# Patient Record
Sex: Female | Born: 1975 | Race: Black or African American | Hispanic: No | Marital: Single | Smoking: Never smoker
Health system: Southern US, Community
[De-identification: ages and names within clinical notes are randomized; demographics above are authoritative.]

## PROBLEM LIST (undated history)

## (undated) DIAGNOSIS — F419 Anxiety disorder, unspecified: Secondary | ICD-10-CM

## (undated) DIAGNOSIS — T7840XA Allergy, unspecified, initial encounter: Secondary | ICD-10-CM

## (undated) DIAGNOSIS — F319 Bipolar disorder, unspecified: Secondary | ICD-10-CM

## (undated) DIAGNOSIS — H409 Unspecified glaucoma: Secondary | ICD-10-CM

## (undated) DIAGNOSIS — F32A Depression, unspecified: Secondary | ICD-10-CM

## (undated) DIAGNOSIS — F329 Major depressive disorder, single episode, unspecified: Secondary | ICD-10-CM

## (undated) DIAGNOSIS — R569 Unspecified convulsions: Secondary | ICD-10-CM

## (undated) HISTORY — PX: FACIAL FRACTURE SURGERY: SHX1570

## (undated) HISTORY — DX: Unspecified glaucoma: H40.9

## (undated) HISTORY — DX: Allergy, unspecified, initial encounter: T78.40XA

## (undated) HISTORY — DX: Anxiety disorder, unspecified: F41.9

## (undated) HISTORY — DX: Depression, unspecified: F32.A

## (undated) HISTORY — PX: TUBAL LIGATION: SHX77

## (undated) HISTORY — DX: Major depressive disorder, single episode, unspecified: F32.9

## (undated) HISTORY — PX: OTHER SURGICAL HISTORY: SHX169

---

## 2006-12-24 ENCOUNTER — Emergency Department: Payer: Self-pay | Admitting: Emergency Medicine

## 2007-10-16 ENCOUNTER — Emergency Department: Payer: Self-pay | Admitting: Emergency Medicine

## 2007-10-16 ENCOUNTER — Other Ambulatory Visit: Payer: Self-pay

## 2007-10-29 ENCOUNTER — Other Ambulatory Visit: Payer: Self-pay

## 2007-10-29 ENCOUNTER — Emergency Department: Payer: Self-pay | Admitting: Emergency Medicine

## 2007-11-16 ENCOUNTER — Emergency Department: Payer: Self-pay | Admitting: Internal Medicine

## 2008-02-01 ENCOUNTER — Emergency Department: Payer: Self-pay | Admitting: Internal Medicine

## 2008-02-01 ENCOUNTER — Other Ambulatory Visit: Payer: Self-pay

## 2008-09-22 ENCOUNTER — Emergency Department: Payer: Self-pay | Admitting: Emergency Medicine

## 2008-09-27 ENCOUNTER — Emergency Department: Payer: Self-pay | Admitting: Emergency Medicine

## 2008-11-25 ENCOUNTER — Emergency Department: Payer: Self-pay | Admitting: Emergency Medicine

## 2009-01-05 ENCOUNTER — Emergency Department: Payer: Self-pay | Admitting: Emergency Medicine

## 2009-02-01 ENCOUNTER — Emergency Department: Payer: Self-pay | Admitting: Emergency Medicine

## 2009-11-23 ENCOUNTER — Emergency Department: Payer: Self-pay | Admitting: Emergency Medicine

## 2010-01-13 ENCOUNTER — Emergency Department: Payer: Self-pay | Admitting: Internal Medicine

## 2010-03-17 ENCOUNTER — Inpatient Hospital Stay: Payer: Self-pay | Admitting: Psychiatry

## 2010-05-25 ENCOUNTER — Inpatient Hospital Stay: Payer: Self-pay | Admitting: Psychiatry

## 2011-02-21 ENCOUNTER — Emergency Department: Payer: Self-pay | Admitting: Emergency Medicine

## 2011-06-18 ENCOUNTER — Inpatient Hospital Stay: Payer: Self-pay | Admitting: Psychiatry

## 2011-07-11 ENCOUNTER — Inpatient Hospital Stay: Payer: Self-pay | Admitting: Psychiatry

## 2011-08-15 ENCOUNTER — Emergency Department: Payer: Self-pay | Admitting: Emergency Medicine

## 2011-08-27 ENCOUNTER — Inpatient Hospital Stay: Payer: Self-pay | Admitting: Psychiatry

## 2011-10-01 ENCOUNTER — Emergency Department: Payer: Self-pay | Admitting: Emergency Medicine

## 2011-10-11 ENCOUNTER — Inpatient Hospital Stay: Payer: Self-pay | Admitting: Psychiatry

## 2011-11-28 ENCOUNTER — Emergency Department: Payer: Self-pay | Admitting: Emergency Medicine

## 2012-01-18 ENCOUNTER — Emergency Department: Payer: Self-pay | Admitting: Unknown Physician Specialty

## 2012-01-18 LAB — URINALYSIS, COMPLETE
Glucose,UR: NEGATIVE mg/dL (ref 0–75)
Leukocyte Esterase: NEGATIVE
Nitrite: NEGATIVE
Protein: NEGATIVE
RBC,UR: 8 /HPF (ref 0–5)
Specific Gravity: 1.002 (ref 1.003–1.030)
WBC UR: <1 /HPF (ref 0–5)

## 2012-01-18 LAB — COMPREHENSIVE METABOLIC PANEL
Alkaline Phosphatase: 72 U/L (ref 50–136)
Anion Gap: 13 (ref 7–16)
BUN: 6 mg/dL — ABNORMAL LOW (ref 7–18)
Bilirubin,Total: 0.3 mg/dL (ref 0.2–1.0)
Calcium, Total: 8.1 mg/dL — ABNORMAL LOW (ref 8.5–10.1)
Creatinine: 0.64 mg/dL (ref 0.60–1.30)
EGFR (African American): >60
EGFR (Non-African Amer.): >60
Osmolality: 286 (ref 275–301)
Potassium: 3.5 mmol/L (ref 3.5–5.1)
SGPT (ALT): 30 U/L
Sodium: 145 mmol/L (ref 136–145)
Total Protein: 7.9 g/dL (ref 6.4–8.2)

## 2012-01-18 LAB — CBC
HCT: 31.9 % — ABNORMAL LOW (ref 35.0–47.0)
HGB: 10.5 g/dL — ABNORMAL LOW (ref 12.0–16.0)
MCH: 24.3 pg — ABNORMAL LOW (ref 26.0–34.0)
MCHC: 32.9 g/dL (ref 32.0–36.0)
MCV: 74 fL — ABNORMAL LOW (ref 80–100)
Platelet: 431 10*3/uL (ref 150–440)
RDW: 18.8 % — ABNORMAL HIGH (ref 11.5–14.5)

## 2012-01-18 LAB — DRUG SCREEN, URINE
Barbiturates, Ur Screen: NEGATIVE (ref ?–200)
Cannabinoid 50 Ng, Ur ~~LOC~~: NEGATIVE (ref ?–50)
Cocaine Metabolite,Ur ~~LOC~~: POSITIVE (ref ?–300)
MDMA (Ecstasy)Ur Screen: NEGATIVE (ref ?–500)
Methadone, Ur Screen: NEGATIVE (ref ?–300)
Opiate, Ur Screen: NEGATIVE (ref ?–300)

## 2012-01-18 LAB — ETHANOL
Ethanol %: 0.366 % (ref 0.000–0.080)
Ethanol: 366 mg/dL

## 2012-01-18 LAB — ACETAMINOPHEN LEVEL: Acetaminophen: <2 ug/mL

## 2012-01-19 LAB — ETHANOL
Ethanol %: 0.092 % — ABNORMAL HIGH
Ethanol: 92 mg/dL

## 2012-01-22 ENCOUNTER — Emergency Department: Payer: Self-pay | Admitting: Emergency Medicine

## 2012-01-22 LAB — DRUG SCREEN, URINE
Amphetamines, Ur Screen: NEGATIVE (ref ?–1000)
Cocaine Metabolite,Ur ~~LOC~~: POSITIVE (ref ?–300)
MDMA (Ecstasy)Ur Screen: NEGATIVE (ref ?–500)
Opiate, Ur Screen: NEGATIVE (ref ?–300)
Phencyclidine (PCP) Ur S: NEGATIVE (ref ?–25)
Tricyclic, Ur Screen: NEGATIVE (ref ?–1000)

## 2012-01-22 LAB — URINALYSIS, COMPLETE
Bacteria: NONE SEEN
Blood: NEGATIVE
Ketone: NEGATIVE
Nitrite: NEGATIVE
Protein: NEGATIVE
Specific Gravity: 1.001 (ref 1.003–1.030)
WBC UR: <1 /HPF (ref 0–5)

## 2012-01-23 LAB — CBC
HGB: 10.6 g/dL — ABNORMAL LOW (ref 12.0–16.0)
MCH: 24.1 pg — ABNORMAL LOW (ref 26.0–34.0)
MCHC: 32.4 g/dL (ref 32.0–36.0)
Platelet: 408 10*3/uL (ref 150–440)
RDW: 19.3 % — ABNORMAL HIGH (ref 11.5–14.5)
WBC: 5.4 10*3/uL (ref 3.6–11.0)

## 2012-01-23 LAB — COMPREHENSIVE METABOLIC PANEL
Alkaline Phosphatase: 63 U/L (ref 50–136)
Anion Gap: 10 (ref 7–16)
BUN: 5 mg/dL — ABNORMAL LOW (ref 7–18)
Chloride: 105 mmol/L (ref 98–107)
Co2: 25 mmol/L (ref 21–32)
Creatinine: 0.65 mg/dL (ref 0.60–1.30)
EGFR (African American): >60
Glucose: 103 mg/dL — ABNORMAL HIGH (ref 65–99)
Potassium: 3.5 mmol/L (ref 3.5–5.1)
SGOT(AST): 123 U/L — ABNORMAL HIGH (ref 15–37)
SGPT (ALT): 63 U/L
Total Protein: 8.3 g/dL — ABNORMAL HIGH (ref 6.4–8.2)

## 2012-01-23 LAB — ACETAMINOPHEN LEVEL: Acetaminophen: <2 ug/mL

## 2012-01-23 LAB — TSH: Thyroid Stimulating Horm: 1.05 u[IU]/mL

## 2012-01-23 LAB — ETHANOL
Ethanol %: 0.216 % — ABNORMAL HIGH (ref 0.000–0.080)
Ethanol: 216 mg/dL

## 2012-04-15 ENCOUNTER — Emergency Department: Payer: Self-pay | Admitting: Emergency Medicine

## 2012-04-15 LAB — DRUG SCREEN, URINE
Barbiturates, Ur Screen: NEGATIVE (ref ?–200)
Benzodiazepine, Ur Scrn: NEGATIVE (ref ?–200)
Cannabinoid 50 Ng, Ur ~~LOC~~: NEGATIVE (ref ?–50)
Cocaine Metabolite,Ur ~~LOC~~: POSITIVE (ref ?–300)
MDMA (Ecstasy)Ur Screen: NEGATIVE (ref ?–500)
Opiate, Ur Screen: NEGATIVE (ref ?–300)
Phencyclidine (PCP) Ur S: NEGATIVE (ref ?–25)

## 2012-04-15 LAB — CBC
HCT: 31.7 % — ABNORMAL LOW (ref 35.0–47.0)
HGB: 10.5 g/dL — ABNORMAL LOW (ref 12.0–16.0)
MCHC: 33.2 g/dL (ref 32.0–36.0)
MCV: 67 fL — ABNORMAL LOW (ref 80–100)
Platelet: 483 10*3/uL — ABNORMAL HIGH (ref 150–440)
RBC: 4.71 10*6/uL (ref 3.80–5.20)
RDW: 19.3 % — ABNORMAL HIGH (ref 11.5–14.5)

## 2012-04-15 LAB — URINALYSIS, COMPLETE
Bilirubin,UR: NEGATIVE
Blood: NEGATIVE
Ketone: NEGATIVE
Nitrite: NEGATIVE
Ph: 5 (ref 4.5–8.0)
Protein: NEGATIVE
Specific Gravity: 1.003 (ref 1.003–1.030)
Squamous Epithelial: 3
WBC UR: 1 /HPF (ref 0–5)

## 2012-04-15 LAB — COMPREHENSIVE METABOLIC PANEL
Albumin: 3.7 g/dL (ref 3.4–5.0)
Anion Gap: 10 (ref 7–16)
BUN: 4 mg/dL — ABNORMAL LOW (ref 7–18)
Bilirubin,Total: 0.3 mg/dL (ref 0.2–1.0)
Calcium, Total: 8.5 mg/dL (ref 8.5–10.1)
Co2: 23 mmol/L (ref 21–32)
Creatinine: 0.87 mg/dL (ref 0.60–1.30)
EGFR (African American): >60
Potassium: 4.1 mmol/L (ref 3.5–5.1)
SGOT(AST): 51 U/L — ABNORMAL HIGH (ref 15–37)
SGPT (ALT): 32 U/L
Sodium: 142 mmol/L (ref 136–145)
Total Protein: 8.4 g/dL — ABNORMAL HIGH (ref 6.4–8.2)

## 2012-04-15 LAB — ETHANOL
Ethanol %: 0.316 % (ref 0.000–0.080)
Ethanol: 316 mg/dL

## 2012-04-15 LAB — ACETAMINOPHEN LEVEL: Acetaminophen: <2 ug/mL

## 2012-04-15 LAB — PREGNANCY, URINE: Pregnancy Test, Urine: NEGATIVE m[IU]/mL

## 2012-06-15 ENCOUNTER — Emergency Department: Payer: Self-pay

## 2012-06-15 LAB — URINALYSIS, COMPLETE
Bacteria: NONE SEEN
Bilirubin,UR: NEGATIVE
Ketone: NEGATIVE
RBC,UR: NONE SEEN /HPF (ref 0–5)
Specific Gravity: 1.004 (ref 1.003–1.030)
Squamous Epithelial: 1
WBC UR: 1 /HPF (ref 0–5)

## 2012-06-15 LAB — DRUG SCREEN, URINE
Amphetamines, Ur Screen: NEGATIVE (ref ?–1000)
Cannabinoid 50 Ng, Ur ~~LOC~~: NEGATIVE (ref ?–50)
MDMA (Ecstasy)Ur Screen: NEGATIVE (ref ?–500)
Methadone, Ur Screen: NEGATIVE (ref ?–300)
Opiate, Ur Screen: NEGATIVE (ref ?–300)
Phencyclidine (PCP) Ur S: NEGATIVE (ref ?–25)

## 2012-06-15 LAB — COMPREHENSIVE METABOLIC PANEL
Albumin: 3.9 g/dL (ref 3.4–5.0)
Alkaline Phosphatase: 75 U/L (ref 50–136)
Anion Gap: 12 (ref 7–16)
Calcium, Total: 8.8 mg/dL (ref 8.5–10.1)
Chloride: 108 mmol/L — ABNORMAL HIGH (ref 98–107)
Glucose: 83 mg/dL (ref 65–99)
Osmolality: 275 (ref 275–301)
Potassium: 4.3 mmol/L (ref 3.5–5.1)
SGOT(AST): 73 U/L — ABNORMAL HIGH (ref 15–37)
Sodium: 140 mmol/L (ref 136–145)
Total Protein: 8.7 g/dL — ABNORMAL HIGH (ref 6.4–8.2)

## 2012-06-15 LAB — ETHANOL
Ethanol %: 0.267 % — ABNORMAL HIGH (ref 0.000–0.080)
Ethanol: 267 mg/dL

## 2012-06-15 LAB — CBC
HCT: 32.7 % — ABNORMAL LOW (ref 35.0–47.0)
HGB: 10 g/dL — ABNORMAL LOW (ref 12.0–16.0)
MCHC: 30.7 g/dL — ABNORMAL LOW (ref 32.0–36.0)
Platelet: 521 10*3/uL — ABNORMAL HIGH (ref 150–440)
RDW: 20 % — ABNORMAL HIGH (ref 11.5–14.5)

## 2012-06-15 LAB — TSH: Thyroid Stimulating Horm: 1.84 u[IU]/mL

## 2013-04-06 ENCOUNTER — Inpatient Hospital Stay: Payer: Self-pay | Admitting: Psychiatry

## 2013-04-06 LAB — COMPREHENSIVE METABOLIC PANEL
Alkaline Phosphatase: 72 U/L (ref 50–136)
Anion Gap: 12 (ref 7–16)
BUN: 6 mg/dL — ABNORMAL LOW (ref 7–18)
Bilirubin,Total: 0.3 mg/dL (ref 0.2–1.0)
Calcium, Total: 8.3 mg/dL — ABNORMAL LOW (ref 8.5–10.1)
Chloride: 109 mmol/L — ABNORMAL HIGH (ref 98–107)
Co2: 18 mmol/L — ABNORMAL LOW (ref 21–32)
Creatinine: 0.74 mg/dL (ref 0.60–1.30)
EGFR (African American): >60
Osmolality: 273 (ref 275–301)
Potassium: 3.6 mmol/L (ref 3.5–5.1)
SGOT(AST): 54 U/L — ABNORMAL HIGH (ref 15–37)
Sodium: 139 mmol/L (ref 136–145)
Total Protein: 8.3 g/dL — ABNORMAL HIGH (ref 6.4–8.2)

## 2013-04-06 LAB — URINALYSIS, COMPLETE
Blood: NEGATIVE
Glucose,UR: NEGATIVE mg/dL (ref 0–75)
Leukocyte Esterase: NEGATIVE
Protein: NEGATIVE
RBC,UR: <1 /HPF (ref 0–5)
WBC UR: <1 /HPF (ref 0–5)

## 2013-04-06 LAB — CBC
RBC: 4.59 10*6/uL (ref 3.80–5.20)
RDW: 18.6 % — ABNORMAL HIGH (ref 11.5–14.5)
WBC: 5.7 10*3/uL (ref 3.6–11.0)

## 2013-04-06 LAB — DRUG SCREEN, URINE
Amphetamines, Ur Screen: NEGATIVE (ref ?–1000)
Barbiturates, Ur Screen: NEGATIVE (ref ?–200)
Cannabinoid 50 Ng, Ur ~~LOC~~: NEGATIVE (ref ?–50)
Cocaine Metabolite,Ur ~~LOC~~: POSITIVE (ref ?–300)
Opiate, Ur Screen: NEGATIVE (ref ?–300)
Phencyclidine (PCP) Ur S: NEGATIVE (ref ?–25)
Tricyclic, Ur Screen: NEGATIVE (ref ?–1000)

## 2013-04-06 LAB — SALICYLATE LEVEL: Salicylates, Serum: <1.7 mg/dL

## 2013-04-06 LAB — TSH: Thyroid Stimulating Horm: 2.97 u[IU]/mL

## 2013-04-07 LAB — BEHAVIORAL MEDICINE 1 PANEL
Anion Gap: 8 (ref 7–16)
Basophil %: 0.8 %
Bilirubin,Total: 0.9 mg/dL (ref 0.2–1.0)
Calcium, Total: 8.7 mg/dL (ref 8.5–10.1)
Chloride: 106 mmol/L (ref 98–107)
Creatinine: 0.59 mg/dL — ABNORMAL LOW (ref 0.60–1.30)
EGFR (African American): >60
EGFR (Non-African Amer.): >60
Glucose: 71 mg/dL (ref 65–99)
HCT: 32.7 % — ABNORMAL LOW (ref 35.0–47.0)
HGB: 11.1 g/dL — ABNORMAL LOW (ref 12.0–16.0)
Lymphocyte #: 1.6 10*3/uL (ref 1.0–3.6)
Lymphocyte %: 28.3 %
MCHC: 33.9 g/dL (ref 32.0–36.0)
Monocyte #: 0.7 x10 3/mm (ref 0.2–0.9)
Neutrophil %: 56.5 %
RBC: 4.77 10*6/uL (ref 3.80–5.20)
SGOT(AST): 77 U/L — ABNORMAL HIGH (ref 15–37)
SGPT (ALT): 34 U/L (ref 12–78)
WBC: 5.7 10*3/uL (ref 3.6–11.0)

## 2013-05-15 ENCOUNTER — Emergency Department: Payer: Self-pay | Admitting: Emergency Medicine

## 2013-05-15 LAB — URINALYSIS, COMPLETE
Glucose,UR: NEGATIVE mg/dL (ref 0–75)
Ketone: NEGATIVE
Nitrite: NEGATIVE
Ph: 6 (ref 4.5–8.0)
Protein: NEGATIVE
Specific Gravity: 1.005 (ref 1.003–1.030)

## 2013-05-15 LAB — DRUG SCREEN, URINE
Barbiturates, Ur Screen: NEGATIVE (ref ?–200)
Benzodiazepine, Ur Scrn: NEGATIVE (ref ?–200)
Cannabinoid 50 Ng, Ur ~~LOC~~: NEGATIVE (ref ?–50)
Opiate, Ur Screen: NEGATIVE (ref ?–300)
Phencyclidine (PCP) Ur S: NEGATIVE (ref ?–25)

## 2013-05-16 LAB — CBC WITH DIFFERENTIAL/PLATELET
Basophil #: 0.1 10*3/uL (ref 0.0–0.1)
Basophil %: 1 %
Eosinophil #: 0 10*3/uL (ref 0.0–0.7)
HCT: 30 % — ABNORMAL LOW (ref 35.0–47.0)
HGB: 10 g/dL — ABNORMAL LOW (ref 12.0–16.0)
Lymphocyte #: 1.6 10*3/uL (ref 1.0–3.6)
MCH: 23 pg — ABNORMAL LOW (ref 26.0–34.0)
MCHC: 33.4 g/dL (ref 32.0–36.0)
Monocyte #: 0.4 x10 3/mm (ref 0.2–0.9)
Monocyte %: 8.8 %
Neutrophil #: 2.8 10*3/uL (ref 1.4–6.5)
Neutrophil %: 57.1 %
Platelet: 271 10*3/uL (ref 150–440)
RBC: 4.36 10*6/uL (ref 3.80–5.20)
RDW: 20.2 % — ABNORMAL HIGH (ref 11.5–14.5)
WBC: 5 10*3/uL (ref 3.6–11.0)

## 2013-05-16 LAB — COMPREHENSIVE METABOLIC PANEL
Albumin: 3.3 g/dL — ABNORMAL LOW (ref 3.4–5.0)
BUN: 7 mg/dL (ref 7–18)
Calcium, Total: 8.4 mg/dL — ABNORMAL LOW (ref 8.5–10.1)
Chloride: 106 mmol/L (ref 98–107)
Co2: 21 mmol/L (ref 21–32)
Creatinine: 0.67 mg/dL (ref 0.60–1.30)
EGFR (African American): >60
EGFR (Non-African Amer.): >60
Osmolality: 273 (ref 275–301)
Potassium: 3.5 mmol/L (ref 3.5–5.1)
Sodium: 138 mmol/L (ref 136–145)
Total Protein: 7.8 g/dL (ref 6.4–8.2)

## 2013-05-16 LAB — ETHANOL
Ethanol %: 0.287 % — ABNORMAL HIGH (ref 0.000–0.080)
Ethanol: 287 mg/dL

## 2014-03-15 ENCOUNTER — Emergency Department: Payer: Self-pay | Admitting: Internal Medicine

## 2014-12-28 ENCOUNTER — Inpatient Hospital Stay: Payer: Self-pay | Admitting: Psychiatry

## 2014-12-28 LAB — CBC
HCT: 31.1 % — ABNORMAL LOW (ref 35.0–47.0)
HGB: 9.9 g/dL — AB (ref 12.0–16.0)
MCH: 21.9 pg — AB (ref 26.0–34.0)
MCHC: 31.9 g/dL — ABNORMAL LOW (ref 32.0–36.0)
MCV: 69 fL — AB (ref 80–100)
PLATELETS: 476 10*3/uL — AB (ref 150–440)
RBC: 4.54 10*6/uL (ref 3.80–5.20)
RDW: 20.9 % — ABNORMAL HIGH (ref 11.5–14.5)
WBC: 5.3 10*3/uL (ref 3.6–11.0)

## 2014-12-28 LAB — DRUG SCREEN, URINE
AMPHETAMINES, UR SCREEN: NEGATIVE (ref ?–1000)
BARBITURATES, UR SCREEN: NEGATIVE (ref ?–200)
Benzodiazepine, Ur Scrn: NEGATIVE (ref ?–200)
Cannabinoid 50 Ng, Ur ~~LOC~~: NEGATIVE (ref ?–50)
Cocaine Metabolite,Ur ~~LOC~~: POSITIVE (ref ?–300)
MDMA (ECSTASY) UR SCREEN: NEGATIVE (ref ?–500)
Methadone, Ur Screen: NEGATIVE (ref ?–300)
OPIATE, UR SCREEN: NEGATIVE (ref ?–300)
Phencyclidine (PCP) Ur S: NEGATIVE (ref ?–25)
TRICYCLIC, UR SCREEN: NEGATIVE (ref ?–1000)

## 2014-12-28 LAB — URINALYSIS, COMPLETE
BACTERIA: NONE SEEN
BILIRUBIN, UR: NEGATIVE
Glucose,UR: NEGATIVE mg/dL (ref 0–75)
Ketone: NEGATIVE
LEUKOCYTE ESTERASE: NEGATIVE
Nitrite: NEGATIVE
PH: 5 (ref 4.5–8.0)
Protein: NEGATIVE
RBC,UR: <1 /HPF (ref 0–5)
Specific Gravity: 1.002 (ref 1.003–1.030)
Squamous Epithelial: 1
WBC UR: NONE SEEN /HPF (ref 0–5)

## 2014-12-28 LAB — COMPREHENSIVE METABOLIC PANEL
ALT: 49 U/L
Albumin: 3.6 g/dL (ref 3.4–5.0)
Alkaline Phosphatase: 85 U/L
Anion Gap: 6 — ABNORMAL LOW (ref 7–16)
BILIRUBIN TOTAL: 0.2 mg/dL (ref 0.2–1.0)
BUN: 6 mg/dL — AB (ref 7–18)
CHLORIDE: 105 mmol/L (ref 98–107)
CREATININE: 0.84 mg/dL (ref 0.60–1.30)
Calcium, Total: 8.5 mg/dL (ref 8.5–10.1)
Co2: 29 mmol/L (ref 21–32)
EGFR (African American): >60
EGFR (Non-African Amer.): >60
Glucose: 86 mg/dL (ref 65–99)
Osmolality: 276 (ref 275–301)
Potassium: 4 mmol/L (ref 3.5–5.1)
SGOT(AST): 75 U/L — ABNORMAL HIGH (ref 15–37)
Sodium: 140 mmol/L (ref 136–145)
TOTAL PROTEIN: 8.5 g/dL — AB (ref 6.4–8.2)

## 2014-12-28 LAB — ETHANOL
Ethanol: 360 mg/dL
Ethanol: <3 mg/dL

## 2015-02-15 ENCOUNTER — Inpatient Hospital Stay: Payer: Self-pay | Admitting: Psychiatry

## 2015-04-06 NOTE — H&P (Signed)
PATIENT NAME:  Kara Lawrence,  S MR#:  [id number] DATE OF BIRTH:  [birth year]  DATE OF ADMISSION:  04/06/2013  DATE OF CONSULTATION: 04/06/2013.   IDENTIFYING INFORMATION AND CHIEF COMPLAINT: A 39 year old woman brought to the Emergency Room after calling the police on herself. The patient tells me today: "I just showed a little emotion."   HISTORY OF PRESENT ILLNESS: The patient obtained from the patient and the chart. The patient apparently called law on herself and then became agitated and aggressive with police when they showed up. This is a pretty typical behavior for her and she has done that before. She was intoxicated obviously. The patient was only willing to speak briefly with me, but did tell me that she has been drinking quite a bit recently, although she cannot say how much. She denied to me that she has been using any other drugs, but she is clearly been using cocaine. She has not been taking any of her prescribed medicines or following up with outpatient psychiatric care. She had been staying at her grandmother's house recently. She says that she has been under a lot of stress and had a lot on her mind, but she will not share any specifics about that. She does not report any new medical problems.   PAST PSYCHIATRIC HISTORY: The patient has an established history of alcohol dependence,  cocaine dependence and a diagnosis of bipolar disorder in the past with mood lability and impulsive behavior, both when she is intoxicated and when she is not. In the past, she has been treated with mood stabilizers, which seemed to have been of some benefit, although the main thing that helps her would be if she could stay sober. She has been referred to outpatient treatment in the past and apparently has followed up for at least periods of time, but recently has not been going. She does have a past history of suicide attempts, mainly by overdose. She has a history of being aggressive, throwing punches, acting  aggressive especially with law enforcement.   SOCIAL HISTORY: The patient has four children, none of whom are in her custody. She has been living with her grandmother or her boyfriend recently. She is not working. She will not share much other recent detail.   PAST MEDICAL HISTORY: No significant ongoing medical problems.   FAMILY HISTORY: No identified family history.    CURRENT MEDICATIONS: None.   ALLERGIES: No known drug allergies.   SUBSTANCE ABUSE HISTORY: History of alcohol dependence. No history of seizures or delirium tremens. History of cocaine dependence.   REVIEW OF SYSTEMS: Complains of being tired. Mood is bad. Denies acute suicidal thoughts. Denies any hallucinations or delusions. Denies being in physical pain or feeling sick to her stomach.   MENTAL STATUS EXAMINATION: The patient was awake and at least partially cooperative. Eye contact poor. Psychomotor activity sluggish. Speech quiet and minimal in amount. Affect blunted. Mood stated as being a bad. Thoughts slow. No gross disorganization. No evidence of delusions. Denied auditory or visual hallucinations. Denied acute suicidal or homicidal plan. Admits that she had had some suicidal thoughts earlier. The patient's alert and oriented x 4. Baseline intelligence average. Judgment and insight poor.   PHYSICAL EXAMINATION: GENERAL: The patient does not appear to be in any acute physical distress. No skin lesions identified.  HEENT: Pupils equal and reactive. Face symmetric. Oral mucosa dry.  NECK AND BACK: Nontender.  MUSCULOSKELETAL: Full range of motion at all extremities. Normal gait. Strength and reflexes normal and symmetric  throughout. Cranial nerves symmetric and normal.  LUNGS: Clear to auscultation. No wheezes.  HEART: Regular rate and rhythm.  ABDOMEN: Soft, nontender, normal bowel sounds.  CURRENT VITAL SIGNS: Show a temperature of 97.6, pulse 102, respirations 18, blood pressure 116/70.   LABORATORY RESULTS:  Drug screen positive for cocaine. Chemistries: Multiple abnormalities, chloride elevated at 109, BUN low at 6, CO2 low at 18, calcium low at 8.3, AST elevated at 54, total protein elevated 8.3. Alcohol level was 310 done at 5:50 this morning. Thyroid stimulating hormone normal at 2.97. CBC shows anemia with hemoglobin of 11.3.   ASSESSMENT: A 39 year old woman with alcohol and cocaine dependence, mood disorder, history of chaotic and dangerous behavior, presents in another fit of dangerousness requiring detox treatment and mood stabilization.   TREATMENT PLAN: Admit to psychiatry. Detox protocol in place. Tomorrow, I will discuss medication with her and propose that we restart Depakote, which has been helpful in the past. Engage her in groups and activities. Try and find out more about her social situation. Work on appropriate outpatient referral.   DIAGNOSIS, PRINCIPAL AND PRIMARY:  AXIS I: Alcohol dependence.   SECONDARY DIAGNOSES: AXIS I:  1.  Cocaine dependence.  2.  Bipolar disorder, not otherwise specified.   AXIS II: Borderline and antisocial personality disorder.   AXIS III: Alcohol intoxication, anemia, iron deficiency, probably from poor diet.   AXIS IV: Severe chronic stress from her illness.   AXIS V: Functioning at time of evaluation 30.    ____________________________ Audery Amel, MD jtc:cc D: 04/06/2013 18:31:58 ET T: 04/06/2013 19:06:33 ET JOB#: [id number]  cc: Audery Amel, MD, <Dictator> Audery Amel MD ELECTRONICALLY SIGNED 04/07/2013 15:09

## 2015-04-06 NOTE — Discharge Summary (Signed)
PATIENT NAME:  Kara Lawrence, Kara Lawrence MR#:  [id number] DATE OF BIRTH:  08-11-1976  DATE OF ADMISSION:  04/06/2013 DATE OF DISCHARGE:  04/07/2013  HOSPITAL COURSE: See dictated history and physical for details of admission. This 39 year old woman with a long history of polysubstance dependence and mood instability came into the hospital intoxicated, agitated, having again called 911 on herself, and then acted out in an agitated manner towards law enforcement when they did arrive. The patient was reticent about giving much history. She did admit that she had had some kind of conflict with her boyfriend, and did not think that she had a place to live right now. She had been back to drinking again. She declined to elaborate for how long she had been drinking heavily, or exactly how much. She also admitted that she has been smoking cocaine and using marijuana. Mood has been more out of control. She has not been going to any kind of outpatient treatment or staying on medication.   The patient was displaying agitated, disorganized behavior when she came to the Emergency Room in an intoxicated manner. Has had statements of suicidal ideation. She was initially uncooperative with evaluation, but after several hours of resting she was willing to at least communicate a small bit, although she remains very tired and withdrawn.   This patient is known to our facility and has had multiple presentations under similar circumstances. She has a longstanding problem with substance dependence, which has led to frequent episodes of dangerous and potentially life-threatening behavior, especially when she starts to act out in an aggressive way with law enforcement. The patient has been transferred to [facility] and been in other substance abuse programs before, with only partial sustained benefit.   At this point the patient's primary need appears to be detox and reinstitution of substance abuse treatment. Medications have been used for  her mood in the past, but it is not clear that they have of significantly greater benefit than staying sober. We have obtained authorization of a bed at [facility]. In my estimation this will be an improved treatment environment because of the addiction specialists there and their specialized program for therapeutic treatment of substance abuse problems, which we have only to a small degree at our facility. The patient will be transferred there. She is currently physically stable, but will probably require further monitoring for detox. She has been restarted on Depakote and Celexa as she was on in the past. At the time of discharge she is not violent or aggressive, but is somewhat passively uncooperative.   MENTAL STATUS EXAMINATION: Disheveled woman, looks her stated age. Minimal to no eye contact. Minimal to no psychomotor activity. Speech minimal in amount. Affect flat,  slightly irritated. Mood stated as being "whatever." Thoughts are flat. Not grossly disorganized. Minimal expression. No obvious psychosis. Denies any acute suicidal or homicidal ideation. Judgment and insight chronically impaired. Baseline intelligence probably average.   LABORATORY RESULTS: Admission labs yesterday showed a TSH normal at 2.9, an initial alcohol level of 310 yesterday at 6:00 in the morning.   Chemistries with just a slightly low BUN at 6, elevated chloride 109, decreased CO2 at 18, calcium low at 8.3, AST elevated at 54, total protein elevated 8.3.   Drug screen positive for cocaine.   CBC shows an anemia with a hematocrit of 31.7, hemoglobin 10.3, low MCV at 69. White count normal.   Pregnancy test negative.   Acetaminophen and salicylates undetected.  DISCHARGE MEDICATIONS: Thiamine 100 mg p.o. daily, trazodone 100 mg p.o. at bedtime p.r.n. for sleep, Celexa 40 mg per day, Depakote 500 mg twice a day, and detox medication at the discretion of the receiving facility.    DIAGNOSES, PRINCIPAL AND PRIMARY:   AXIS I: Alcohol dependence.   SECONDARY DIAGNOSES: AXIS I:  1.  Cocaine dependence.  2.  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   AXIS II: Antisocial and borderline features.   AXIS III: Alcohol withdrawal, and chronic anemia.   AXIS IV: Severe, from homelessness, joblessness, chaotic living.   AXIS V: Functioning at time of discharge: 40.     ____________________________ Audery AmelJohn T. Carleta Woodrow, MD jtc:dm D: 04/07/2013 10:46:21 ET T: 04/07/2013 10:54:45 ET JOB#: [id number]  cc: Audery AmelJohn T. Demani Weyrauch, MD, <Dictator> Audery AmelJOHN T Shiya Fogelman MD ELECTRONICALLY SIGNED 04/07/2013 15:09

## 2015-04-06 NOTE — Consult Note (Signed)
Brief Consult Note: Diagnosis: alcohol dependence.   Patient was seen by consultant.   Recommend further assessment or treatment.   Orders entered.   Discussed with Attending MD.   Comments: Psychiatry: Patient seen. Chart reviewed. Patient with longstanding SA and mood problem. ADmit for detox and mood stabilization.  Electronic Signatures: Kataleyah Carducci, Jackquline DenmarkJohn T (MD)  (Signed 23-Apr-14 18:23)  Authored: Brief Consult Note   Last Updated: 23-Apr-14 18:23 by Audery Amellapacs, Tegh Franek T (MD)

## 2015-04-08 NOTE — Consult Note (Signed)
Brief Consult Note: Diagnosis: polysubstance dependence.   Patient was seen by consultant.   Consult note dictated.   Discussed with Attending MD.   Comments: Psychiatry: Patient seen. Chart reviewed. 39 year old woman with history of substance abuse presented to er yesterday acutely intoxicated. Was voicing some suicidal ideation. Now she is sober and denies any suicidal ideation. Mood is reported as being good. Thoughts are lucid. Affect euthymic and at baseline. Patient has outpt treatment in place and is agreeable to following up. Has a place to live. Spoke with ER doctor. Given past history and current presenttion pt not acutely dangerous and not likely to benifit from hospital treatment.  Terminating the IVC.  Electronic Signatures: Audery Amellapacs, John T (MD)  (Signed 08-Feb-13 15:23)  Authored: Brief Consult Note   Last Updated: 08-Feb-13 15:23 by Audery Amellapacs, John T (MD)

## 2015-04-08 NOTE — Consult Note (Signed)
PATIENT NAME:  Kara Lawrence, Kara Lawrence MR#:  [id number] DATE OF BIRTH:  Aug 08, 1976  DATE OF CONSULTATION:  04/16/2012  REFERRING PHYSICIAN:   CONSULTING PHYSICIAN:  Audery Amel, MD  IDENTIFYING INFORMATION AND REASON FOR CONSULT: This is a 39 year old woman who came into the emergency room last night highly intoxicated, agitated. Consult for evaluation of dangerousness and disposition.   CHIEF COMPLAINT: Her chief complaint to me today is "can I go home now".   HISTORY OF PRESENT ILLNESS: The patient came into the emergency room yesterday evening and was extremely intoxicated. She tells me today that she cannot remember how much she was drinking. When she came in last night she was almost incoherent was screaming, yelling, and could not even stand up to go to the bathroom. The evaluation from last night says that she had made some comments about taking some kind of pills and an overdose and needing to be in the hospital. Labs were done, and there is no sign of any overdose other than use of cocaine. She tells me today that she drank about 14 or more beers yesterday, cannot remember the total amount. Also admits that she uses cocaine intermittently. She has been noncompliant with her psychiatric medicine and her follow-up psychiatric appointments. She says that her mood for the most part has been good. She does not feel like she has a lot of stress in her life. Totally denies any suicidal ideation or homicidal ideation. Denies any psychotic symptoms. She denies any tremors or withdrawal symptoms currently.   PAST PSYCHIATRIC HISTORY: She has been seen outpatient for mood stability and has been prescribed Depakote in the past. She says that at times she is compliant with it, but recently she has not been picking it up and not been following up with outpatient therapy either. She has a long history of alcohol dependence and has presented multiple times to the emergency room in a similar condition of  intoxication. As usual when she sobers up, she returns to a calm baseline and totally denies any suicidal ideation. Unfortunately she also has no motivation or interest in stopping drinking.   SUBSTANCE ABUSE HISTORY: Many years of heavy drinking like this. Drinks on a daily basis and then occasionally binge drinks as well. Uses cocaine intermittently. She denies that she has been routinely using any other drugs. No history of DTs or seizures.   SOCIAL HISTORY: The patient lives with her boyfriend. They have a long-term relationship, which at times is stormy and agitated, but she says recently they have been getting along fine. She is not working.   PAST MEDICAL HISTORY: Other than the recurrent alcohol dependence and abuse she does not regularly have other medical problems.   MEDICATIONS: Not taking any right now, but is prescribed usually Depakote 500 mg twice a day, citalopram 20 mg two of them a day, and trazodone 100 mg at night.   ALLERGIES: No known drug allergies.   REVIEW OF SYSTEMS: The patient states that right now her mood is feeling fine. Denies depression. Denies suicidal or homicidal ideation. Denies any hallucinations. Denies feeling sick to her stomach. Denies any shakes or tremors or confusion.   MENTAL STATUS EXAMINATION: Dressed in hospital pajamas. Alert and cooperative with the interview. Good eye contact throughout. Psychomotor activity normal. No sign of tremor. Affect euthymic and reactive. Mood stated as fine. Thoughts lucid and directed. No evidence of loosening of associations or delusions. The patient understands the risks involved in her continued drinking and  says that she plans to probably continue drinking despite that. No evidence of psychosis. Poor judgment but insight probably adequate.   PHYSICAL EXAMINATION: A full physical is not done but vital signs currently show a temperature of 98.2, pulse 77, respirations 20, and blood pressure 117/68. No sign of tremor. It  does not look like she is malnourished.   LABS/STUDIES: EKG showed a prolonged QT a little bit but otherwise was normal. Pregnancy test negative. Positive leukocyte esterase in her urine. Drug screen positive for cocaine. TSH normal. Hematocrit low at 31.7. Alcohol level last night was 316. That was actually done after she had been in the emergency room for a couple of hours. Chemistry panel showed elevated AST at 51 and elevated total protein at 8.4.   ASSESSMENT: A 39 year old woman with alcohol dependence. Intoxicated and agitated last night but today denies any suicidal ideation, affect is calm, and she is not psychotic. She has been counseled in depth about the risks of continued alcohol abuse including the multiple physical problems accompanying it and the high risk of death if she continues drinking as she has been. She states an understanding of all this. She has no interest in coming into the hospital to stop drinking. She does say that she intends to follow up with outpatient psychiatric treatment which she has found positive in the past when she gets her medicine. At this point, she is no longer meeting commitment criteria.   TREATMENT PLAN: I have talked about but situation with the emergency room doctor. The patient is not interested in getting alcohol treatment and does not need acute detox. No evidence of suicidality or homicidality. No psychosis. At this point no indication for further hospitalization. Her commitment is being discontinued. She has been counseled as I mentioned above about the risks of alcohol abuse and the help that is available to stop. She has been given an appointment [facility] to get her medications refilled and follow up with treatment for mood problems.  DIAGNOSIS PRINCIPLE AND PRIMARY:   AXIS I: Alcohol dependence.   SECONDARY DIAGNOSES:   AXIS I:  1. Substance-induced mood disorder.  2. Cocaine dependence.   AXIS II:  Deferred.   AXIS III: Resolved intoxication.   AXIS IV: Moderate to severe - Chronic from the stress of her binge drinking.   AXIS V: Functioning at time of evaluation 50. ____________________________ Audery AmelJohn T. Clapacs, MD jtc:slb D: 04/16/2012 15:25:18 ET     T: 04/16/2012 15:34:38 ET        JOB#: [id number] cc: Audery AmelJohn T. Clapacs, MD, <Dictator> Audery AmelJOHN T CLAPACS MD ELECTRONICALLY SIGNED 04/19/2012 12:41

## 2015-04-08 NOTE — Consult Note (Signed)
PATIENT NAME:  Kara Lawrence, Kara Lawrence MR#:  [id number] DATE OF BIRTH:  [birth year]  DATE OF CONSULTATION:  01/23/2012  REFERRING PHYSICIAN:   CONSULTING PHYSICIAN:  Kara Amel, MD  IDENTIFYING INFORMATION AND REASON FOR CONSULT: This is a 39 year old woman who presented to the emergency room agitated, intoxicated, having made suicidal statement, and acted out wrapping a belt around her neck in the presence of police. Since coming into the emergency room, the patient has been largely cooperative. She has calmed down significantly and at this point is denying any suicidal ideation. She admits that she was drinking yesterday and that she has gotten back into binge drinking of late. She says she was drinking quite a bit of beer, she cannot remember how much. She also admits that she has been using cocaine. She said that she had stayed off cocaine for a little while but that when somebody brings it back around her it is hard for her to say no. She has not been taking her prescribed medication, she ran out of them and missed an appointment. She says that she and her boyfriend are not having any particular problems right now so she does not have that stress on her. She says otherwise she does not have any new stress that has happen to her. She says she is completely agreeable to going back to her outpatient appointments and getting back on her medicine. She does not have any history of DTs or seizures coming off alcohol. She is not currently having shakiness, nausea, sweating, or any symptoms of alcohol withdrawal.   PAST PSYCHIATRIC HISTORY: Long history of polysubstance dependence. Often presents  agitated and making suicidal statements when she is intoxicated. Typically sobers up back to a calm baseline. She has been treated in the past with antidepressants and valproic acid. The valproic acid may be for a possible diagnosis of seizures, although it is not clear that she really has epileptic seizures. In any  case, she feels like the medication is partially helpful, but she acknowledges that when she can stay sober that makes a bigger difference.   PAST MEDICAL HISTORY: She may have had seizures in the past, although its not entirely clear to me whether she has true epilepsy. Otherwise has chronic anemia related to heavy menstrual period. No other ongoing medical problems.   SOCIAL HISTORY: The patient usually either lives with her boyfriend or some of her other extended family in the area. She has four biological children, but all of them have been adopted out and she has lost custody of them. She does not work. She acknowledges that most of her time is spent idle which contributes to her substance abuse.   REVIEW OF SYSTEMS: Denies any acute pain. Denies any shakiness, sweating, or nausea. Denies suicidal or homicidal ideation. Says that her mood feels pretty good. Denies any psychotic symptoms.   MENTAL STATUS EXAMINATION: The patient was interviewed in the Emergency Room. She was cooperative and appropriate during the exam. Eye contact was appropriate and normal. Psychomotor activity normal. Speech normal rate, tone, and volume. Affect was euthymic, reactive and appropriate. Mood was stated as being good. Thoughts appeared lucid and directed with no evidence of loosening of associations or delusional thinking. Denies hallucinations. Totally denies any current suicidal or homicidal ideation. States that she actually feels good about her life and can name things that she wants to live for and that she enjoys being with her friends and family. She appears to be future oriented and  is able to reasonably discuss medical treatment. At this point, her insight seems okay, although her chronic judgment is impaired by her substance abuse.   PHYSICAL EXAMINATION: Full physical exam is not done for this consult. The patient does not appear to be in any acute physical distress. She is able to sit up, shows full range of  motion in all joints, and normal gait. She does not appear dizzy. Recent vitals show a blood pressure of 126/87, pulse 92, respirations 18, and temperature earlier was 96.8. No sign of any tremor  or movement problems.   ASSESSMENT: This is a 39 year old woman with polysubstance dependence and possible mood disorder, but usually mood symptoms seem to be primarily related to intoxication. At this point, she has sobered up and is back to her baseline without any acute mood symptoms, denies any suicidal or homicidal ideation, is not psychotic and is agreeable to outpatient treatment. She has been admitted to the hospital in the past and typically has not benefited significantly from it. She does have outpatient treatment already in place with [facility] and is fully agreeable to staying on medication and going back to her appointments there. At this point, there seems to be no clinical benefit from hospitalization and she is not acutely dangerous, not acutely meeting commitment criteria.   TREATMENT PLAN: I will discontinue the papers for involuntary commitment. We have made arrangements for her to have an appointment back to [facility] treatment [facility]. We have also made arrangements for her to get her medications called in so that she can even get those before the weekend. The patient has been counseled about substance abuse and mood problems and strongly encouraged to discontinue cocaine and alcohol and get back into treatment. She is agreeable to the treatment plan.    DIAGNOSIS PRINCIPLE AND PRIMARY:   AXIS I: Polysubstance dependence.   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   AXIS II: Antisocial traits.   AXIS III: Rule out seizure disorder, chronic anemia.   AXIS IV: Moderate - chronic stress from chaotic social situation.   AXIS V: Functioning at time of discharge 55.  ____________________________ Kara AmelJohn T. Clapacs,  MD jtc:slb D: 01/23/2012 15:35:01 ET T: 01/23/2012 16:17:21 ET JOB#: [id number]  cc: Kara AmelJohn T. Clapacs, MD, <Dictator> Kara AmelJOHN T CLAPACS MD ELECTRONICALLY SIGNED 01/25/2012 14:42

## 2015-04-08 NOTE — Consult Note (Signed)
Brief Consult Note: Diagnosis: alcohol dependance.   Patient was seen by consultant.   Consult note dictated.   Discussed with Attending MD.   Comments: Psychiatry: Patient seen. Well known to ER. Presented last night very intoxicated. While intoxicated she was yelling, agitated, disorganized. May have made statements about taking an overdose. Today, sober, she is at her baseline of calm affect and denies she took any pills or had any thought of harming self. Denies any current suicidal or homicidal ideation. At this point does nnot want or need any forced detox. No interest in stopping drinking. No longer commitable. Will release commitment. Patient has follow up appt set up at local mental health and agrees to go.  Electronic Signatures: Audery Amellapacs, Jairen Goldfarb T (MD)  (Signed 03-May-13 15:16)  Authored: Brief Consult Note   Last Updated: 03-May-13 15:16 by Audery Amellapacs, Eldena Dede T (MD)

## 2015-04-15 NOTE — H&P (Signed)
PATIENT NAME:  Kara Lawrence, Kara S MR#:  Lawrence DATE OF BIRTH:  1976-06-08  DATE OF ADMISSION:  02/15/2015  REFERRING PHYSICIAN: Emergency Room MD.   ATTENDING PHYSICIAN: Kristine LineaJolanta Sincere Liuzzi, MD.   IDENTIFYING DATA: Kara Lawrence is a 39 year old female with history of depression, mood instability and substance use.   CHIEF COMPLAINT:  I'm suicidal.   HISTORY OF PRESENT ILLNESS:  Kara Lawrence has a long history of mental illness with multiple psychiatric hospitalizations and treatment for bipolar illness as well as substance use. She was hospitalized at [facility] in January of 2016. She was discharged on a combination of Depakote and Celexa. She is in the care of Dr. Marguerite [facility]Moffett at [facility]. It is unclear whether or not the patient did follow up, initially she told Dr. Toni Amendlapacs that she did not and now she is telling me that she did see her doctor and the next appointment is in April.  She reports that she does have medications at home. She relapsed on alcohol and became increasingly depressed with poor sleep, decreased appetite, anhedonia, feeling of guilt, hopelessness, worthlessness, poor energy and concentration, social isolation, crying spells, and suicidal ideation. She also has been using cocaine. She denies psychotic symptoms. Denies symptoms suggestive of bipolar mania.   PAST PSYCHIATRIC HISTORY: Multiple psychiatric hospitalizations for substance use and bipolar depression and there is a history of good response to Depakote when she takes it. She did go to rehabilitation at some point and adamantly refuses to do it again. She denies ever attempting suicide.   FAMILY PSYCHIATRIC HISTORY: Denies any.   PAST MEDICAL HISTORY: GERD.   ALLERGIES: No known drug allergies.   MEDICATIONS ON ADMISSION: Depakote 250 mg twice daily, Celexa 40 mg daily, trazodone 150 mg at bedtime.   SOCIAL HISTORY: She is homeless at the moment, stays with friends.  The last time she was discharged  to her grandmother's house, she no longer can stay with her mother. She did apply for disability, but at the moment has no income or health insurance. She denies any legal charges pending.     REVIEW OF SYSTEMS:    CONSTITUTIONAL: No fevers or chills. No weight changes.  EYES: No double or blurred vision.  EARS, NOSE, AND THROAT: No hearing loss.  RESPIRATORY: No shortness of breath or cough.  CARDIOVASCULAR: No chest pain or orthopnea.  GASTROINTESTINAL: Positive for abdominal discomfort, nausea, and some vomiting.  GENITOURINARY: No incontinence or frequency.  ENDOCRINE: No heat or cold intolerance.  LYMPHATIC: No anemia or easy bruising.  INTEGUMENTARY: No acne or rash.  MUSCULOSKELETAL: No muscle or joint pain.  NEUROLOGIC: No tingling or weakness.  PSYCHIATRIC: See history of present illness for details.   PHYSICAL EXAMINATION:  VITAL SIGNS: Blood pressure 134/92, pulse 89, respirations 20, temperature 98.4.  GENERAL: This is a well-developed, middle-aged female in no acute distress.  HEENT: The pupils are equal, round, and reactive to light. Sclerae are anicteric.  NECK: Supple. No thyromegaly.  LUNGS: Clear to auscultation. No dullness to percussion.  HEART: Regular rhythm and rate. No murmurs, rubs, or gallops.  ABDOMEN: Soft, nontender, nondistended. Positive bowel sounds.  MUSCULOSKELETAL: Normal muscle strength in all extremities.  SKIN: No rashes or bruises.  LYMPHATIC: No cervical adenopathy.  NEUROLOGIC: Cranial nerves II through XII are intact.   LABORATORY DATA: Chemistries are within normal limits. Blood alcohol level is 377. LFTs within normal limits except for AST of 82. Urine toxicology screen positive for cocaine. CBC, white count 6.1, red  count 4.65, hemoglobin 9.9, hematocrit 30.7, platelets 378,000, MCV 66. Urinalysis is not suggestive of urinary tract infection. Serum acetaminophen and salicylate are low.   MENTAL STATUS EXAMINATION ON ADMISSION: The patient  is asleep, but easily arousable. She is pleasant, polite, and cooperative. She is oriented to person, place, time, and situation. She recognizes me from previous admissions. She maintains limited eye contact. Her speech is of normal rhythm, rate, and volume. Mood is depressed with flat affect. Thought process is logical and goal oriented. Thought content, she denies suicidal or homicidal ideation at the moment, but came to the Emergency Room complaining of thoughts of hurting herself. There are no delusions or paranoia. There are no auditory or visual hallucinations. Her cognition is grossly intact. Registration, recall, short and long-term memory are intact. She is of average intelligence and fund of knowledge. Her insight and judgment are questionable.   SUICIDE RISK ASSESSMENT ON ADMISSION: This is a patient with history of depression, mood instability, substance use, treatment noncompliance, who came to the hospital complaining of suicidal ideation in the context of multiple social stressors and relapse on alcohol.   INITIAL DIAGNOSES:   AXIS I:   1.  Bipolar 2 disorder.  2.  Alcohol use disorder, severe.  3.  Cocaine use disorder, severe.  4.  Alcohol intoxication.  5.  Gastroesophageal reflux disease.   PLAN: The patient was admitted to [facility] Medicine unit for safety, stabilization, and medication management.   1.  Suicidal ideation. The patient is able to contract for safety.  2.  Mood. We will continue or restart possibly Depakote, Celexa, and trazodone.  3.  Alcohol withdrawal. She is on a CIWA protocol. We will monitor for symptoms of alcohol withdrawal.  4.  Substance abuse treatment. The patient already declines residential treatment.  5.  Gastroesophageal reflux disease. We will prescribe Protonix.  6.  Disposition.  To be established. She believes that she will return to her friends. She will follow up with Dr. Marguerite Olea.      ____________________________ Ellin Goodie. Jennet Maduro, MD jbp:bu D: 02/15/2015 16:11:44 ET T: 02/15/2015 16:30:21 ET JOB#: [id number]  cc: Pilar Westergaard B. Jennet Maduro, MD, <Dictator> Shari Prows MD ELECTRONICALLY SIGNED 02/26/2015 7:26

## 2015-04-15 NOTE — Consult Note (Signed)
PATIENT NAME:  Kara Lawrence, Kara Lawrence MR#:  [id number] DATE OF BIRTH:  03-18-1976  DATE OF CONSULTATION:  12/28/2014  REFERRING PHYSICIAN:   CONSULTING PHYSICIAN:  Audery AmelJohn T. Clapacs, MD  IDENTIFYING INFORMATION AND REASON FOR CONSULTATION: A 39 year old woman with a history of substance abuse and mood instability comes to the hospital stating "I am thinking of doing away with myself".   HISTORY OF PRESENT ILLNESS: Information obtained from the patient and the chart. The patient is not a very forthcoming historian, but tells me that for days now she has been thinking of killing herself. She will not give me any specific methodology for it, but says that she is sure that she is going to try and kill herself. Mood has been sad and hopeless. Sleep is poor. Feeling physically sick and run down. Has not been on her psychiatric medicine for several days. Additionally, she admits that she is drinking heavily, although she will not quantify the amount. Claims that she uses cocaine only occasionally, but clearly has been using cocaine within the last day or so. Denies any current hallucinations, but says sometime she will hear or see things when she is intoxicated.   PAST PSYCHIATRIC HISTORY: The patient has been admitted to the hospital in the past for her substance abuse and depression and mood instability. Has a history of self injury in the past. Has history of being unpleasant, especially while intoxicated. Currently sees Dr. Bard HerbertMoffit and takes a combination of Depakote, trazodone and Celexa.   SOCIAL HISTORY: Currently living with her grandmother. The patient does not work. Says that she feels like she is disabled, but has not gotten disability approved yet. Very limited support and finances.   FAMILY HISTORY: Denies any family history of substance abuse or mental illness.   PAST MEDICAL HISTORY: She claims she has a seizure disorder, although I am not sure that that has been independently validated. Denies any  other significant ongoing medical problems.   CURRENT MEDICATIONS: Depakote, not entirely clear dose but looks like it might just be 500 mg at night, Celexa 40 mg a day, trazodone 150 mg at night.   ALLERGIES: No known drug allergies.   REVIEW OF SYSTEMS: Tired. Depressed. Angry. Suicidal thoughts with intent. No other acute physical complaints.   MENTAL STATUS EXAMINATION: Disheveled woman, looks her stated age, very passively cooperative with the interview. Makes no eye contact. Psychomotor activity nonexistent during the interview. Speech is very decreased in amount. Answers most questions in only a couple of words. Affect irritable and dysphoric. Mood stated as angry. Thoughts appear to be lucid but slow. No obvious delusions. Denies current hallucinations. Endorses suicidal thoughts, endorses homicidal feelings but no intent or plan. She is alert and oriented x4. Can repeat 3 words immediately, remembers 2 of them at three minutes. Judgment and insight impaired.   LABORATORY RESULTS: Chemistry panel with elevated AST 75, otherwise unremarkable. Alcohol level was 360 on presentation last night. CBC shows elevated platelet count 476,000. Urinalysis unremarkable. Drug screen positive for cocaine.   VITAL SIGNS: Blood pressure is currently 128/65, respirations 18, pulse 87, and temperature 98.4.   ASSESSMENT: A 39 year old woman with mood instability, possibly directly related to substance abuse but possibly also has an independent condition. Currently absolutely endorses suicidal ideation. Wants to be admitted to the hospital. Feels unsafe going home. The patient meets criteria because of suicidal thoughts, multiple stresses.   TREATMENT PLAN: Admit to psychiatry. Suicide, seizure, and close precautions in place. Continue Depakote. Continue Celexa  and trazodone. The patient agrees to plan. Brief counseling completed.  DIAGNOSIS, PRINCIPAL AND PRIMARY:  AXIS I: Bipolar disorder, not otherwise  specified.   SECONDARY DIAGNOSES: AXIS I:  1.  Cocaine abuse, severe. 2.  Alcohol abuse, severe.  AXIS II: Antisocial and borderline features.  AXIS III: No diagnosis.   ____________________________ Audery Amel, MD jtc:sb D: 12/28/2014 12:31:20 ET T: 12/28/2014 12:47:17 ET JOB#: [id number]  cc: Audery Amel, MD, <Dictator> Audery Amel MD ELECTRONICALLY SIGNED 01/24/2015 17:19

## 2015-04-15 NOTE — Consult Note (Signed)
PATIENT NAME:  Kara Lawrence, Kara Lawrence MR#:  [id number] DATE OF BIRTH:  1976-05-26  DATE OF CONSULTATION:  02/14/2015  REFERRING PHYSICIAN:   CONSULTING PHYSICIAN:  Audery Amel, MD  IDENTIFYING INFORMATION AND REASON FOR CONSULTATION: A 39 year old woman with a history of bipolar disorder and substance abuse, presented to the hospital.   CHIEF COMPLAINT: "I got sick again."   HISTORY OF PRESENT ILLNESS: Information obtained from the patient and the chart. The patient says she called 911 herself to bring herself into the hospital. She says that she has not been sleeping well. Her mood has been up and down, depressed and angry. She claims she is still taking her psychiatric medicine, but has not been to see a provider in the time since she left here last. She admits that she has been back to abusing drugs. Has been using cocaine, also drinking. Will not quantify the exact amount that she has been drinking, but says it has been heavy for quite a time. She denies any homicidal ideation, but says she has positive suicidal ideation with wishes that she were dead and has been having auditory hallucinations.   PAST PSYCHIATRIC HISTORY: History of cocaine and alcohol abuse and bipolar disorder. History of hostility, but not overt violence in the past. Has had multiple psychiatric hospitalizations, most recently just a couple of months ago. She is supposed to be followed by [facility] and Dr. Marguerite Olea, but says that she did not make her followup since she was here last.   SOCIAL HISTORY: The patient says she has basically been staying here and there which seems to imply mostly in bad situations. When she left here last time she was supposed to stay with her grandmother, it is not clear how long that lasted. She is not working. She has adult children, otherwise no family that she is really in contact regularly with.   PAST MEDICAL HISTORY: No significant medical problems.   FAMILY HISTORY: Does not report any.    CURRENT MEDICATIONS: Depakote 250 mg twice a day, Celexa 40 mg per day, trazodone 150 mg at night.   ALLERGIES: No known drug allergies.  SUBSTANCE ABUSE HISTORY: Recurrent history of alcohol and cocaine abuse. No history of delirium tremens.   REVIEW OF SYSTEMS: Feeling sick, sick to her stomach, tired, run down, depressed, auditory hallucinations, suicidal ideation.   MENTAL STATUS EXAMINATION: A somewhat disheveled woman, looks her stated age, very passively cooperative with the interview. Makes only intermittent eye contact. Psychomotor activity very limited. Mostly stays lying down motionless throughout the interview. Speech is decreased in total amount and quiet in tone. Affect is slightly irritated and flat. Mood stated as being bad. Thoughts are slow, but no obvious delusions or bizarre thinking. Denies visual hallucinations. Says she has been having auditory hallucinations, does not describe what they are. Endorses suicidal thought. No homicidal ideation. No specific plan. She is alert and oriented x 4. She can repeat 3 words immediately, 2 out of 3 at 3 minutes. Judgment and insight at least partially intact.   LABORATORY RESULTS: Drug screen positive for cocaine. Alcohol level 337 when she came in last night at 7:30. Chemistry panel, elevated AST 82, low BUN, otherwise no abnormalities. Hematology panel, low hemoglobin and hematocrit at 9.9 and 30.7, normal platelet count. Urinalysis unremarkable.   VITAL SIGNS: Blood pressure 136/80, respirations 20, pulse 107, temperature 98.3.   ASSESSMENT: A 39 year old woman with a history of bipolar disorder and substance abuse. Mood has been bad, more labile, more  suicidal ideation, and hallucinations. Probably at least in part related to her cocaine and alcohol abuse. Continues to be dysphoric and suicidal and can be admitted to the hospital.   TREATMENT PLAN: Admit to psychiatry. Continue Depakote and Celexa and trazodone. Close precautions,  fall precautions, suicide precautions. Engage in groups and activities on the unit. I can put her on the usual alcohol detoxification orders although she may not need them.   DIAGNOSIS PRINCIPAL AND PRIMARY:   AXIS I: Bipolar disorder, depressed.   SECONDARY DIAGNOSES:   AXIS I:  1.  Cocaine abuse, severe.  2.  Alcohol abuse, severe.   AXIS II: Deferred.   AXIS III: No diagnosis.    ____________________________ Audery AmelJohn T. Gedeon Brandow, MD jtc:bu D: 02/14/2015 15:49:11 ET T: 02/14/2015 16:20:25 ET JOB#: [id number]  cc: Audery AmelJohn T. Vickie Melnik, MD, <Dictator> Audery AmelJOHN T Ruhee Enck MD ELECTRONICALLY SIGNED 02/20/2015 10:39

## 2015-04-24 NOTE — H&P (Signed)
PATIENT NAME: Kara Lawrence, Kara Lawrence MR#: [id number] DATE OF BIRTH: Mar 30, 1976  DATE OF ADMISSION: 12/28/2014  INITIAL PSYCHIATRIC ASSESSMENT  IDENTIFYING INFORMATION: A 39 year old AA female from [place].   CHIEF COMPLAINT: "I was going to kill myself."  HISTORY OF PRESENT ILLNESS: Data collected from patient and chart. Patient has been noted to be and continues to be a poor historian, often shrugging her shoulders during the interview when she refuses to answer a question. The patient has also offered contradicting data several times.  Patient has an extensive PMH of bipolar, depression and substance abuse, as well as diagnoses of borderline and antisocial personality disorders. Patient has contemplated and attempted suicide several times in the past, but quantifies the timeline as "a long time ago" and will state no further detail. Patient states that a few days ago she began considering suicide because she "got stressed out". Refuses to disclose specific stressors, but states that it was mostly relational issues. Denies that she had a specific plan to end her life. Patient reports that she told someone that she knew about her suicidal ideation and that they were the ones to call emergency services, who transported the patient to the ED. She declines to identify the person and declines to specify if they are a friend, family member, stranger or otherwise.  Patient reports that she has been using cocaine regularly, but not daily. Will not specify the amount. Patient reports that she is not currently using tobacco or marijuana. When asked if she has used these in the past, patient states "a long time ago" but will not specify the timeline. Patient states that she drinks heavily, but that she "doesn't count just drinks". Per nurse, patient reported that she drinks more than a 12-pack daily. Patient'Lawrence ETOH level in the ED was 360 mg/DL and her tox screen was positive for cocaine.  Patient states that she  has been attending follow-up appointments with [facility]. [facility]offit at [facility] in [place]. Patient stated to this writer that she has been compliant with taking her prescribed medications, hesitates when asked about any missed doses but eventually denies. However, patient reported on 1/14 that she had skipped several days of her medication. Patient is adamant about making sure she attends an appointment coming up with [facility] on the 26th. Patient also states that she has an appointment with social services on the 21st that is "very important for me to get to".  Denies current SI, HI, AVH. Denies any side effects on current medications. Reporst that her appetite and sleep are "good". Denies symptoms of withdrawal or any history of prior withdrawal, despite the high alcohol level detected in ED on 1/14.   PAST PSYCHIATRIC HISTORY: The patient has been hospitalized several times and reports several diagnoses of bipolar, depression and various types of substance abuse. Per patient chart, also has a history of self-harm and diagnoses of borderline and antisocial personality disorder. Patient states that she has been attending follow-up appointments with Dr. Bard [facility]offit at [facility] in [place] and that she takes Depakote, trazodone and Celexa. Patient declined to divulge any other psychiatric history.   PAST MEDICAL HISTORY: Patient states that she has a history of seizures, for which she takes Depakote 250 mg twice daily. Refused to offer specifics regarding the nature of her seizures. Patient reports that she thinks she may have had a seizure on the day that she was brought in to the ED.   FAMILY HISTORY: Denies any family history of suicide, depression, substance abuse or bipolar.  SOCIAL HISTORY: States that she was living with her mother but is planning on living at her grandmother'Lawrence after her discharge. Declines to elaborate. Has not worked in over four years and is currently trying to get disability. Does not have Medicaid.  Denies current legal trouble, but states that she did spend time in prison "a long time ago". abuse: most recently has been abusing cocaine and alcohol. Previously abused marijuana. Patient refuses to disclose details about the particulars of her substance abuse.   ALLERGIES: No known drug allergies.   REVIEW OF SYSTEMS: Denies any muscle aches or pains. Denies tremor or other symptoms of withdrawal.   MENTAL STATUS EXAMINATION: The patient is a 39 year old AA female with unkempt hair who is found lying in bed and refuses to move from the fetal position. Behavior: The patient was irritated during the interview and often shrugged her shoulder in leiu of answering questions. Used the phrases "I don't know" and "a long time ago" and expressed irritation when asked to elaborate. Speech: Patient is able to communicate, speech is aggressive at times. Tone and pace were appropriate. Thought process was hostile and irritable. Pt denied current SI, HI, AVH. Mood was irritable, sullen and anxious. Her affect was appropriate. Insight and judgment are impaired. On cognitive examination, she is alert and oriented in person, place, time, and situation. Attention and concentration appear to be reluctantly given but intact.   PHYSICAL EXAMINATION:  VITAL SIGNS: Blood pressure 128/78, respirations 20, pulse 92, temperature 98.6  MUSCULOSKELETAL: Unable to assess, as patient refuses to move from the bed.  GENERAL: The patient is a 39 year old AA female who is irritable but in no acute distress.   LABORATORY RESULTS: ETOH was 360 on 1/14 in the ED. AST is elevated. Tox screen positive for cocaine.   DIAGNOSES: Unspecified depressive disorderBipolar disorder, not otherwise specifiedabuse disorder, severeabuse disorder, severeand borderline traitsfeatureshistory of seizures   ASSESSMENT: The patient is a 39 year old AA female with a long history of substance abuse and mental illness, including antisocial and  borderline personality disorder. She presented to the ED because of her statement of intent to kill herself. Patient attributes this to current stressors in her life that she declines to identify and denies that she had a specific plan to take her life. She was guarded during the interview, often getting irritated with questions that were personal, detailed or that she said that "you guys know this already, I've been here before". Denies current SI, HI, AVH or alcohol withdrawal symptoms. Denies any side effects of medications at this time.   PLAN:  1. For depression, will continue Celexa 40 mg p.o. daily 2. Patient denied tobacco abuse, but due to the poor history given by the patient, will continue orders for nicotine inhaler prn.  3. For sleep, continue trazodone 150 mg p.o. at bedtime 4. For alcohol withdrawal, will give 50 mg of Librium p.o. q 8 hours 5. For seizures/bipolar, continue Depakote ER 250 mg p.o. b.i.d. 6. Check vital signs q 8 hours. Check CIWA q 8 hours.   7. Precautions: She will be placed on elopement, suicide and close monitoring precautions.  8. Status at admission: The patient has been admitted on involuntary commitment.    Electronic Signatures: Jimmy FootmanHernandez-Gonzalez, Gregary Blackard (MD) (Signed on 15-Jan-16 16:23)  Authored   Last Updated: 15-Jan-16 17:03 by Jimmy FootmanHernandez-Gonzalez, Read Bonelli (MD)

## 2015-05-05 ENCOUNTER — Encounter: Payer: Self-pay | Admitting: *Deleted

## 2015-05-05 ENCOUNTER — Emergency Department
Admission: EM | Admit: 2015-05-05 | Discharge: 2015-05-05 | Disposition: A | Discharge status: Home / Self Care | Payer: Self-pay | Attending: Emergency Medicine | Admitting: Emergency Medicine

## 2015-05-05 DIAGNOSIS — Z3202 Encounter for pregnancy test, result negative: Secondary | ICD-10-CM | POA: Insufficient documentation

## 2015-05-05 DIAGNOSIS — F10129 Alcohol abuse with intoxication, unspecified: Secondary | ICD-10-CM | POA: Insufficient documentation

## 2015-05-05 DIAGNOSIS — Z79899 Other long term (current) drug therapy: Secondary | ICD-10-CM | POA: Insufficient documentation

## 2015-05-05 DIAGNOSIS — F10929 Alcohol use, unspecified with intoxication, unspecified: Secondary | ICD-10-CM

## 2015-05-05 DIAGNOSIS — R45851 Suicidal ideations: Secondary | ICD-10-CM

## 2015-05-05 HISTORY — DX: Bipolar disorder, unspecified: F31.9

## 2015-05-05 HISTORY — DX: Unspecified convulsions: R56.9

## 2015-05-05 LAB — CBC
HCT: 31.7 % — ABNORMAL LOW (ref 35.0–47.0)
HEMOGLOBIN: 10.3 g/dL — AB (ref 12.0–16.0)
MCH: 21.6 pg — ABNORMAL LOW (ref 26.0–34.0)
MCHC: 32.6 g/dL (ref 32.0–36.0)
MCV: 66.2 fL — ABNORMAL LOW (ref 80.0–100.0)
PLATELETS: 427 10*3/uL (ref 150–440)
RBC: 4.79 MIL/uL (ref 3.80–5.20)
RDW: 20.1 % — ABNORMAL HIGH (ref 11.5–14.5)
WBC: 4.5 10*3/uL (ref 3.6–11.0)

## 2015-05-05 LAB — COMPREHENSIVE METABOLIC PANEL
ALT: 42 U/L (ref 14–54)
AST: 87 U/L — ABNORMAL HIGH (ref 15–41)
Albumin: 4.2 g/dL (ref 3.5–5.0)
Alkaline Phosphatase: 73 U/L (ref 38–126)
Anion gap: 12 (ref 5–15)
BUN: 7 mg/dL (ref 6–20)
CO2: 25 mmol/L (ref 22–32)
CREATININE: 0.55 mg/dL (ref 0.44–1.00)
Calcium: 9 mg/dL (ref 8.9–10.3)
Chloride: 104 mmol/L (ref 101–111)
GFR calc Af Amer: >60 mL/min (ref >60)
GFR calc non Af Amer: >60 mL/min (ref >60)
Glucose, Bld: 95 mg/dL (ref 65–99)
Potassium: 3.5 mmol/L (ref 3.5–5.1)
Sodium: 141 mmol/L (ref 135–145)
Total Bilirubin: 0.3 mg/dL (ref 0.3–1.2)
Total Protein: 8.8 g/dL — ABNORMAL HIGH (ref 6.5–8.1)

## 2015-05-05 LAB — URINE DRUG SCREEN, QUALITATIVE (ARMC ONLY)
Amphetamines, Ur Screen: NOT DETECTED — AB
BARBITURATES, UR SCREEN: NOT DETECTED — AB
Benzodiazepine, Ur Scrn: NOT DETECTED — AB
CANNABINOID 50 NG, UR ~~LOC~~: NOT DETECTED — AB
Cocaine Metabolite,Ur ~~LOC~~: POSITIVE — AB
MDMA (ECSTASY) UR SCREEN: NOT DETECTED — AB
Methadone Scn, Ur: NOT DETECTED — AB
OPIATE, UR SCREEN: NOT DETECTED — AB
Phencyclidine (PCP) Ur S: NOT DETECTED — AB
TRICYCLIC, UR SCREEN: NOT DETECTED — AB

## 2015-05-05 LAB — SALICYLATE LEVEL: Salicylate Lvl: <4 mg/dL (ref 2.8–30.0)

## 2015-05-05 LAB — ACETAMINOPHEN LEVEL: Acetaminophen (Tylenol), Serum: <10 ug/mL — ABNORMAL LOW (ref 10–30)

## 2015-05-05 LAB — VALPROIC ACID LEVEL: Valproic Acid Lvl: 31 ug/mL — ABNORMAL LOW (ref 50.0–100.0)

## 2015-05-05 LAB — PREGNANCY, URINE: Preg Test, Ur: NEGATIVE

## 2015-05-05 LAB — ETHANOL: Alcohol, Ethyl (B): 402 mg/dL (ref <5)

## 2015-05-05 MED ORDER — IBUPROFEN 600 MG PO TABS
ORAL_TABLET | ORAL | Status: AC
  Filled 2015-05-05: qty 1

## 2015-05-05 MED ORDER — LORAZEPAM 2 MG/ML IJ SOLN: 0.0000 mg | Freq: Four times a day (QID) | INTRAMUSCULAR | Status: DC

## 2015-05-05 MED ORDER — LORAZEPAM 2 MG/ML IJ SOLN: 0.0000 mg | Freq: Four times a day (QID) | INTRAMUSCULAR | Status: DC | PRN

## 2015-05-05 MED ORDER — VITAMIN B-1 100 MG PO TABS
ORAL_TABLET | ORAL | Status: AC
  Filled 2015-05-05: qty 1

## 2015-05-05 MED ORDER — CITALOPRAM HYDROBROMIDE 20 MG PO TABS: 40.0000 mg | ORAL_TABLET | Freq: Every day | ORAL | Status: DC

## 2015-05-05 MED ORDER — VITAMIN B-1 100 MG PO TABS
100.0000 mg | ORAL_TABLET | Freq: Every day | ORAL | Status: DC
  Administered 2015-05-05: 100 mg via ORAL

## 2015-05-05 MED ORDER — DIVALPROEX SODIUM 500 MG PO DR TAB: 500.0000 mg | DELAYED_RELEASE_TABLET | Freq: Every day | ORAL | Status: DC

## 2015-05-05 MED ORDER — TRAZODONE HCL 50 MG PO TABS: 150.0000 mg | ORAL_TABLET | Freq: Every day | ORAL | Status: DC

## 2015-05-05 MED ORDER — LORAZEPAM 2 MG PO TABS
0.0000 mg | ORAL_TABLET | Freq: Four times a day (QID) | ORAL | Status: DC | PRN
Start: 2015-05-05 — End: 2015-05-05

## 2015-05-05 MED ORDER — IBUPROFEN 600 MG PO TABS
600.0000 mg | ORAL_TABLET | Freq: Once | ORAL | Status: AC
  Administered 2015-05-05: 600 mg via ORAL

## 2015-05-05 MED ORDER — THIAMINE HCL 100 MG/ML IJ SOLN: 100.0000 mg | Freq: Every day | INTRAMUSCULAR | Status: DC

## 2015-05-05 MED ORDER — LORAZEPAM 2 MG PO TABS: 0.0000 mg | ORAL_TABLET | Freq: Two times a day (BID) | ORAL | Status: DC

## 2015-05-05 MED ORDER — LORAZEPAM 2 MG PO TABS: 0.0000 mg | ORAL_TABLET | Freq: Four times a day (QID) | ORAL | Status: DC | PRN

## 2015-05-05 MED ORDER — LORAZEPAM 2 MG PO TABS: 0.0000 mg | ORAL_TABLET | Freq: Four times a day (QID) | ORAL | Status: DC

## 2015-05-05 MED ORDER — LORAZEPAM 2 MG/ML IJ SOLN: 0.0000 mg | Freq: Two times a day (BID) | INTRAMUSCULAR | Status: DC

## 2015-05-05 NOTE — ED Notes (Signed)
BEHAVIORAL HEALTH ROUNDING Patient sleeping: No. Patient alert and oriented: yes Behavior appropriate: Yes.  ; If no, describe:  Nutrition and fluids offered: Yes  Toileting and hygiene offered: Yes  Sitter present: no Law enforcement present: Yes  

## 2015-05-05 NOTE — Discharge Instructions (Signed)
Alcohol Intoxication  Alcohol intoxication occurs when the amount of alcohol that a person has consumed impairs his or her ability to mentally and physically function. Alcohol directly impairs the normal chemical activity of the brain. Drinking large amounts of alcohol can lead to changes in mental function and behavior, and it can cause many physical effects that can be harmful.   Alcohol intoxication can range in severity from mild to very severe. Various factors can affect the level of intoxication that occurs, such as the person's age, gender, weight, frequency of alcohol consumption, and the presence of other medical conditions (such as diabetes, seizures, or heart conditions). Dangerous levels of alcohol intoxication may occur when people drink large amounts of alcohol in a short period (binge drinking). Alcohol can also be especially dangerous when combined with certain prescription medicines or "recreational" drugs.  SIGNS AND SYMPTOMS  Some common signs and symptoms of mild alcohol intoxication include:  · Loss of coordination.  · Changes in mood and behavior.  · Impaired judgment.  · Slurred speech.  As alcohol intoxication progresses to more severe levels, other signs and symptoms will appear. These may include:  · Vomiting.  · Confusion and impaired memory.  · Slowed breathing.  · Seizures.  · Loss of consciousness.  DIAGNOSIS   Your health care provider will take a medical history and perform a physical exam. You will be asked about the amount and type of alcohol you have consumed. Blood tests will be done to measure the concentration of alcohol in your blood. In many places, your blood alcohol level must be lower than 80 mg/dL (0.08%) to legally drive. However, many dangerous effects of alcohol can occur at much lower levels.   TREATMENT   People with alcohol intoxication often do not require treatment. Most of the effects of alcohol intoxication are temporary, and they go away as the alcohol naturally  leaves the body. Your health care provider will monitor your condition until you are stable enough to go home. Fluids are sometimes given through an IV access tube to help prevent dehydration.   HOME CARE INSTRUCTIONS  · Do not drive after drinking alcohol.  · Stay hydrated. Drink enough water and fluids to keep your urine clear or pale yellow. Avoid caffeine.    · Only take over-the-counter or prescription medicines as directed by your health care provider.    SEEK MEDICAL CARE IF:   · You have persistent vomiting.    · You do not feel better after a few days.  · You have frequent alcohol intoxication. Your health care provider can help determine if you should see a substance use treatment counselor.  SEEK IMMEDIATE MEDICAL CARE IF:   · You become shaky or tremble when you try to stop drinking.    · You shake uncontrollably (seizure).    · You throw up (vomit) blood. This may be bright red or may look like black coffee grounds.    · You have blood in your stool. This may be bright red or may appear as a black, tarry, bad smelling stool.    · You become lightheaded or faint.    MAKE SURE YOU:   · Understand these instructions.  · Will watch your condition.  · Will get help right away if you are not doing well or get worse.  Document Released: 09/10/2005 Document Revised: 08/03/2013 Document Reviewed: 05/06/2013  Exit[employer]® Patient Information ©2015 [employer]. This information is not intended to replace advice given to you by your health care provider. Make sure   you discuss any questions you have with your health care provider.

## 2015-05-05 NOTE — ED Provider Notes (Signed)
Patient was seen by Dr. Guss Bundehalla of the psychiatric service here at [facility]. She has released this patient from IVC. The patient initially was quite intoxicated with no level of 402 was concerns for suicidal ideation. At this point the patient is not suicidal. The patient appears to be sober and is stable and ready for discharge.  Diagnosis: Suicidal ideation, resolved . Acute alcohol intoxication  Darien Ramusavid W Earlyne Feeser, MD 05/05/15 1800

## 2015-05-05 NOTE — ED Notes (Signed)
BEHAVIORAL HEALTH ROUNDING Patient sleeping: No. Patient alert and oriented: yes Behavior appropriate: Yes.  ;  Nutrition and fluids offered: Yes  Toileting and hygiene offered: Yes  Sitter present: yes Law enforcement present: Yes  

## 2015-05-05 NOTE — ED Notes (Signed)
BEHAVIORAL HEALTH ROUNDING Patient sleeping: Yes.   Patient alert and oriented: yes Behavior appropriate: Yes.  ; If no, describe:  Nutrition and fluids offered: Yes  Toileting and hygiene offered: Yes  Sitter present: no Law enforcement present: Yes  

## 2015-05-05 NOTE — ED Notes (Signed)
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
## 2015-05-05 NOTE — ED Notes (Signed)
BEHAVIORAL HEALTH ROUNDING Patient sleeping: Yes.   Patient alert and oriented: yes Behavior appropriate: Yes.  ; If no, describe:  Nutrition and fluids offered: Yes  and No Toileting and hygiene offered: Yes  Sitter present: no Law enforcement present: Yes

## 2015-05-05 NOTE — ED Notes (Signed)
Pt to ED via [facility] c/o SI.  Pt states "I don't want to live anymore.  I have a lot of stuff going on like boyfriend, family".  Pt states plan with pills.  Pt denies HI.  Pt has hx of SI attempt with pills.

## 2015-05-05 NOTE — ED Provider Notes (Signed)
[facility] Emergency Department Provider Note  ____________________________________________  Time seen: Approximately 4:43 AM  I have reviewed the triage vital signs and the nursing notes.   HISTORY  Chief Complaint Medical Clearance and Suicidal   HPI Kara Lawrence is a 39 y.o. female patient does not currently want to speak she communicates right now by nodding her head yes and no apparently told the police that she wanted to kill herself by overdosing she nods her head yes when I ask her about that does not answer when I ask if she still wanted to kill herself denies any other medical problems although her past medical history says she has a history of seizures and bipolar disorder   Past Medical History  Diagnosis Date  . Seizure   . Bipolar 1 disorder   . Manic depression     There are no active problems to display for this patient.   Past Surgical History  Procedure Laterality Date  . Tubal ligation Bilateral     Current Outpatient Rx  Name  Route  Sig  Dispense  Refill  . citalopram (CELEXA) 40 MG tablet   Oral   Take 40 mg by mouth daily.         . divalproex (DEPAKOTE) 500 MG DR tablet   Oral   Take 500 mg by mouth 3 (three) times daily.         . traZODone (DESYREL) 150 MG tablet   Oral   Take by mouth at bedtime.           Allergies Review of patient's allergies indicates no known allergies.  History reviewed. No pertinent family history.  Social History History  Substance Use Topics  . Smoking status: Never Smoker   . Smokeless tobacco: Never Used  . Alcohol Use: No     Comment: pt smells strongly of ETOH    Review of Systems Review of systems limited by the fact that she will not answer any questions  ____________________________________________   PHYSICAL EXAM:  VITAL SIGNS: ED Triage Vitals  Enc Vitals Group     BP 05/05/15 0216 116/86 mmHg     Pulse Rate 05/05/15 0216 93     Resp 05/05/15  0216 18     Temp 05/05/15 0216 98.2 F (36.8 C)     Temp Source 05/05/15 0216 Oral     SpO2 05/05/15 0216 96 %     Weight 05/05/15 0216 134 lb (60.782 kg)     Height 05/05/15 0216  (1.626 m)     Head Cir --      Peak Flow --      Pain Score --      Pain Loc --      Pain Edu? --      Excl. in GC? --     Constitutional: Alert cannot determine if he is oriented and she will not answer any questions Eyes: Conjunctivae are normal. PERRL. EOMI. Head: Atraumatic. Nose: No congestion/rhinnorhea. Mouth/Throat: Mucous membranes are moist.  Oropharynx non-erythematous. Neck: No stridor.  Cardiovascular: Normal rate, regular rhythm. Grossly normal heart sounds.  Good peripheral circulation. Respiratory: Normal respiratory effort.  No retractions. Lungs CTAB. Gastrointestinal: Soft and nontender. No distention. No abdominal bruits. No CVA tenderness. Patient is not cooperative with neurological or psychological evaluation  ____________________________________________   LABS (all labs ordered are listed, but only abnormal results are displayed)  Labs Reviewed  ACETAMINOPHEN LEVEL - Abnormal; Notable for the following:  Acetaminophen (Tylenol), Serum <10 (*)    All other components within normal limits  CBC - Abnormal; Notable for the following:    Hemoglobin 10.3 (*)    HCT 31.7 (*)    MCV 66.2 (*)    MCH 21.6 (*)    RDW 20.1 (*)    All other components within normal limits  COMPREHENSIVE METABOLIC PANEL - Abnormal; Notable for the following:    Total Protein 8.8 (*)    AST 87 (*)    All other components within normal limits  ETHANOL - Abnormal; Notable for the following:    Alcohol, Ethyl (B) 402 (*)    All other components within normal limits  URINE DRUG SCREEN, QUALITATIVE ([facility]) - Abnormal; Notable for the following:    Tricyclic, Ur Screen NOT DETECTED (*)    Amphetamines, Ur Screen NOT DETECTED (*)    MDMA (Ecstasy)Ur Screen NOT DETECTED (*)    Cocaine  Metabolite,Ur [place] POSITIVE (*)    Opiate, Ur Screen NOT DETECTED (*)    Phencyclidine (PCP) Ur S NOT DETECTED (*)    Cannabinoid 50 Ng, Ur [place] NOT DETECTED (*)    Barbiturates, Ur Screen NOT DETECTED (*)    Benzodiazepine, Ur Scrn NOT DETECTED (*)    Methadone Scn, Ur NOT DETECTED (*)    All other components within normal limits  VALPROIC ACID LEVEL - Abnormal; Notable for the following:    Valproic Acid Lvl 31 (*)    All other components within normal limits  SALICYLATE LEVEL  PREGNANCY, URINE   ____________________________________________  EKG  Not done ____________________________________________  RADIOLOGY  Not done ____________________________________________   PROCEDURES  Procedure(s) performed: None  Critical Care performed: No  ____________________________________________   INITIAL IMPRESSION / ASSESSMENT AND PLAN / ED COURSE  Pertinent labs & imaging results that were available during my care of the patient were reviewed by me and considered in my medical decision making (see chart for details).   ____________________________________________   FINAL CLINICAL IMPRESSION(S) / ED DIAGNOSES  Final diagnoses:  Suicidal ideation     Arnaldo NatalPaul F Malinda, MD 05/05/15 [phone]

## 2015-05-05 NOTE — Consult Note (Signed)
[place] [facility] Consult   Reason for Consult:  For follow up. Referring Physician:  Er Patient Identification: Kara Lawrence MRN:  [id number] Principal Diagnosis: <principal problem not specified> Diagnosis:  There are no active problems to display for this patient.   Total Time spent with patient: 45 minutes  Subjective:   Kara Lawrence is a 39 y.o. female patient admitted with "suicidal thoughts after conflict with boy friend and son that is 82 yrs old"  HPI:  Pt is trying to get disability and started drinking alcohol and her BAL was in 400 range. Decided to get help HPI Elements:     Past Medical History:  Past Medical History  Diagnosis Date  . Seizure   . Bipolar 1 disorder   . Manic depression     Past Surgical History  Procedure Laterality Date  . Tubal ligation Bilateral    Family History: History reviewed. No pertinent family history. Social History:  History  Alcohol Use No    Comment: pt smells strongly of ETOH     History  Drug Use No    History   Social History  . Marital Status: Single    Spouse Name: N/A  . Number of Children: N/A  . Years of Education: N/A   Social History Main Topics  . Smoking status: Never Smoker   . Smokeless tobacco: Never Used  . Alcohol Use: No     Comment: pt smells strongly of ETOH  . Drug Use: No  . Sexual Activity: Yes    Birth Control/ Protection: None   Other Topics Concern  . None   Social History Narrative  . None   Additional Social History:                          Allergies:  No Known Allergies  Labs:  Results for orders placed or performed during the hospital encounter of 05/05/15 (from the past 48 hour(s))  Acetaminophen level     Status: Abnormal   Collection Time: 05/05/15  2:46 AM  Result Value Ref Range   Acetaminophen (Tylenol), Serum <10 (L) 10 - 30 ug/mL    Comment:        THERAPEUTIC CONCENTRATIONS VARY SIGNIFICANTLY. A RANGE OF 10-30 ug/mL MAY BE AN  EFFECTIVE CONCENTRATION FOR MANY PATIENTS. HOWEVER, SOME ARE BEST TREATED AT CONCENTRATIONS OUTSIDE THIS RANGE. ACETAMINOPHEN CONCENTRATIONS >150 ug/mL AT 4 HOURS AFTER INGESTION AND >50 ug/mL AT 12 HOURS AFTER INGESTION ARE OFTEN ASSOCIATED WITH TOXIC REACTIONS.   CBC     Status: Abnormal   Collection Time: 05/05/15  2:46 AM  Result Value Ref Range   WBC 4.5 3.6 - 11.0 K/uL   RBC 4.79 3.80 - 5.20 MIL/uL   Hemoglobin 10.3 (L) 12.0 - 16.0 g/dL   HCT 31.7 (L) 35.0 - 47.0 %   MCV 66.2 (L) 80.0 - 100.0 fL   MCH 21.6 (L) 26.0 - 34.0 pg   MCHC 32.6 32.0 - 36.0 g/dL   RDW 20.1 (H) 11.5 - 14.5 %   Platelets 427 150 - 440 K/uL  Comprehensive metabolic panel     Status: Abnormal   Collection Time: 05/05/15  2:46 AM  Result Value Ref Range   Sodium 141 135 - 145 mmol/L   Potassium 3.5 3.5 - 5.1 mmol/L   Chloride 104 101 - 111 mmol/L   CO2 25 22 - 32 mmol/L   Glucose, Bld 95 65 - 99 mg/dL  BUN 7 6 - 20 mg/dL   Creatinine, Ser 0.55 0.44 - 1.00 mg/dL   Calcium 9.0 8.9 - 10.3 mg/dL   Total Protein 8.8 (H) 6.5 - 8.1 g/dL   Albumin 4.2 3.5 - 5.0 g/dL   AST 87 (H) 15 - 41 U/L   ALT 42 14 - 54 U/L   Alkaline Phosphatase 73 38 - 126 U/L   Total Bilirubin 0.3 0.3 - 1.2 mg/dL   GFR calc non Af Amer >60 >60 mL/min   GFR calc Af Amer >60 >60 mL/min    Comment: (NOTE) The eGFR has been calculated using the CKD EPI equation. This calculation has not been validated in all clinical situations. eGFR's persistently <60 mL/min signify possible Chronic Kidney Disease.    Anion gap 12 5 - 15  Ethanol (ETOH)     Status: Abnormal   Collection Time: 05/05/15  2:46 AM  Result Value Ref Range   Alcohol, Ethyl (B) 402 (HH) <5 mg/dL    Comment: CRITICAL RESULT CALLED TO, READ BACK BY AND VERIFIED WITH RESULTS VERIFIED BY REPEAT TESTING STEVEN JONES AT 2952 05/05/15.PMH        LOWEST DETECTABLE LIMIT FOR SERUM ALCOHOL IS 11 mg/dL FOR MEDICAL PURPOSES ONLY   Salicylate level     Status: None    Collection Time: 05/05/15  2:46 AM  Result Value Ref Range   Salicylate Lvl <8.4 2.8 - 30.0 mg/dL  Urine Drug Screen, Qualitative [facility])     Status: Abnormal   Collection Time: 05/05/15  2:46 AM  Result Value Ref Range   Tricyclic, Ur Screen NOT DETECTED (A) NONE DETECTED   Amphetamines, Ur Screen NOT DETECTED (A) NONE DETECTED   MDMA (Ecstasy)Ur Screen NOT DETECTED (A) NONE DETECTED   Cocaine Metabolite,Ur [place] POSITIVE (A) NONE DETECTED   Opiate, Ur Screen NOT DETECTED (A) NONE DETECTED   Phencyclidine (PCP) Ur S NOT DETECTED (A) NONE DETECTED   Cannabinoid 50 Ng, Ur [place] NOT DETECTED (A) NONE DETECTED   Barbiturates, Ur Screen NOT DETECTED (A) NONE DETECTED   Benzodiazepine, Ur Scrn NOT DETECTED (A) NONE DETECTED   Methadone Scn, Ur NOT DETECTED (A) NONE DETECTED    Comment: (NOTE) 132  Tricyclics, urine               Cutoff 1000 ng/mL 200  Amphetamines, urine             Cutoff 1000 ng/mL 300  MDMA (Ecstasy), urine           Cutoff 500 ng/mL 400  Cocaine Metabolite, urine       Cutoff 300 ng/mL 500  Opiate, urine                   Cutoff 300 ng/mL 600  Phencyclidine (PCP), urine      Cutoff 25 ng/mL 700  Cannabinoid, urine              Cutoff 50 ng/mL 800  Barbiturates, urine             Cutoff 200 ng/mL 900  Benzodiazepine, urine           Cutoff 200 ng/mL 1000 Methadone, urine                Cutoff 300 ng/mL 1100 1200 The urine drug screen provides only a preliminary, unconfirmed 1300 analytical test result and should not be used for non-medical 1400 purposes. Clinical consideration and professional judgment should 1500 be applied to any  positive drug screen result due to possible 1600 interfering substances. A more specific alternate chemical method 1700 must be used in order to obtain a confirmed analytical result.  1800 Gas chromato graphy / mass spectrometry (GC/MS) is the preferred 1900 confirmatory method.   Pregnancy, urine (if pre-menopausal female)     Status: None    Collection Time: 05/05/15  2:46 AM  Result Value Ref Range   Preg Test, Ur NEGATIVE NEGATIVE  Valproic acid level     Status: Abnormal   Collection Time: 05/05/15  2:46 AM  Result Value Ref Range   Valproic Acid Lvl 31 (L) 50.0 - 100.0 ug/mL    Vitals: Blood pressure 102/67, pulse 92, temperature 98.2 F (36.8 C), temperature source Oral, resp. rate 18, height _0  (1.626 m), weight 60.782 kg (134 lb), last menstrual period 04/24/2015, SpO2 96 %.  Risk to Self: Suicidal Ideation: Yes-Currently Present Suicidal Intent: No-Not Currently/Within Last 6 Months Is patient at risk for suicide?: Yes Suicidal Plan?: No Access to Means: No What has been your use of drugs/alcohol within the last 12 months?: alcohol; has been drinking since her teens; recently has been drinking every day at least a case of beer; and some cocaine and marijuana." How many times?: 1 Other Self Harm Risks: 'jUST NOTHIN." Triggers for Past Attempts: Family contact, Other personal contacts (HOUSING; FINANCES; LACK OF SUPPORT) Intentional Self Injurious Behavior: None Risk to Others: Homicidal Ideation: No Thoughts of Harm to Others: No Current Homicidal Intent: No Current Homicidal Plan: No Access to Homicidal Means: No Identified Victim: NOBODY History of harm to others?: No Assessment of Violence: On admission Violent Behavior Description: DENIES Does patient have access to weapons?: No Criminal Charges Pending?: No Does patient have a court date: No Prior Inpatient Therapy: Prior Inpatient Therapy: Yes Prior Therapy Dates: UNKNOWN [facility]Provider[facility]; [facility]; Reason for Treatment: DEPRESSION; DETOX Prior Outpatient Therapy: Prior Outpatient Therapy: No Prior Therapy Dates: UNKNOWN Prior Therapy Facilty/Provider(s): UNKNOWN Reason for Treatment: UNKNOWN Does patient have an ACCT team?: No Does patient have Intensive In-House Services?  : No Does patient have [facility] services? : No Does  patient have P4CC services?: No  Current Facility-Administered Medications  Medication Dose Route Frequency Provider Last Rate Last Dose  . [START ON 05/06/2015] citalopram (CELEXA) tablet 40 mg  40 mg Oral Daily Ahmed Prima, MD      . divalproex (DEPAKOTE) DR tablet 500 mg  500 mg Oral QHS Ahmed Prima, MD      . LORazepam (ATIVAN) injection 0-4 mg  0-4 mg Intravenous Q6H PRN Nena Polio, MD      . LORazepam (ATIVAN) injection 0-4 mg  0-4 mg Intravenous Q6H PRN Nena Polio, MD      . LORazepam (ATIVAN) tablet 0-4 mg  0-4 mg Oral Q6H PRN Nena Polio, MD      . LORazepam (ATIVAN) tablet 0-4 mg  0-4 mg Oral Q6H PRN Nena Polio, MD      . thiamine (B-1) injection 100 mg  100 mg Intravenous Daily Nena Polio, MD   100 mg at 05/05/15 1000  . thiamine (VITAMIN B-1) tablet 100 mg  100 mg Oral Daily Nena Polio, MD   100 mg at 05/05/15 1008  . traZODone (DESYREL) tablet 150 mg  150 mg Oral QHS Ahmed Prima, MD       Current Outpatient Prescriptions  Medication Sig Dispense Refill  . citalopram (CELEXA) 40 MG tablet Take  40 mg by mouth daily.    . divalproex (DEPAKOTE) 500 MG DR tablet Take 500 mg by mouth 3 (three) times daily.    . traZODone (DESYREL) 150 MG tablet Take by mouth at bedtime.      Musculoskeletal: Strength & Muscle Tone: within normal limits Gait & Station: normal Patient leans: N/A  Psychiatric Specialty Exam: Physical Exam  Review of Systems  Constitutional: Negative.   HENT: Negative.   Eyes: Negative.   Respiratory: Negative.   Cardiovascular: Negative.   Gastrointestinal: Negative.   Genitourinary: Negative.   Musculoskeletal: Negative.   Skin: Negative.   Neurological: Negative.   Endo/Heme/Allergies: Negative.   Psychiatric/Behavioral: Positive for depression and substance abuse.  All other systems reviewed and are negative.   Blood pressure 102/67, pulse 92, temperature 98.2 F (36.8 C), temperature source Oral, resp. rate 18,  height _0  (1.626 m), weight 60.782 kg (134 lb), last menstrual period 04/24/2015, SpO2 96 %.Body mass index is 22.99 kg/(m^2).  General Appearance: Casual  Eye Contact::  Good  Speech:  Clear and Coherent  Volume:  Normal  Mood:  stable and is eager to go home  Affect:  Appropriate  Thought Process:  Coherent  Orientation:  Full (Time, Place, and Person)  Thought Content:  Negative  Suicidal Thoughts:  No  Homicidal Thoughts:  No  Memory:  Immediate;   Good Recent;   Good Remote;   Good good and adequate.  Judgement:  Fair  Insight:  Fair  Psychomotor Activity:  Normal  Concentration:  Good  Recall:  Good  Fund of Knowledge:Good  Language: good  Akathisia:  No  Handed:  Right  AIMS (if indicated):     Assets:  Communication Skills Desire for Improvement Housing Physical Health Social Support Transportation Others:  lives with [facility]  ADL's:  Intact  Cognition: WNL  Sleep:      Medical Decision Making: Review of Psycho-Social Stressors (1), Decision to obtain old records (1), Review and summation of old records (2) and Review or order medicine tests (1)  Treatment Plan Summary: Plan D/C IVC and discharge pt home with [facility] and will keep her apt with [facility] coming up soon and will call as needed.  Plan:  No evidence of imminent risk to self or others at present.   Disposition: as above  Jaivian Battaglini K 05/05/2015 5:10 PM

## 2015-05-05 NOTE — ED Notes (Signed)
Pt states she wants to kill herself. Pt denies ETOH use but smells strongly of it. Pt unable to provide targeted answers to the triage questions. Pt states she was going to take some pills to kill herself.

## 2015-08-30 ENCOUNTER — Emergency Department
Admission: EM | Admit: 2015-08-30 | Discharge: 2015-08-31 | Disposition: A | Discharge status: Other Acute Inpatient Hospital | Payer: No Typology Code available for payment source | Attending: Emergency Medicine | Admitting: Emergency Medicine

## 2015-08-30 ENCOUNTER — Encounter: Payer: Self-pay | Admitting: Emergency Medicine

## 2015-08-30 DIAGNOSIS — F32A Depression, unspecified: Secondary | ICD-10-CM

## 2015-08-30 DIAGNOSIS — F314 Bipolar disorder, current episode depressed, severe, without psychotic features: Secondary | ICD-10-CM

## 2015-08-30 DIAGNOSIS — F329 Major depressive disorder, single episode, unspecified: Secondary | ICD-10-CM | POA: Insufficient documentation

## 2015-08-30 DIAGNOSIS — F1410 Cocaine abuse, uncomplicated: Secondary | ICD-10-CM | POA: Insufficient documentation

## 2015-08-30 DIAGNOSIS — F10120 Alcohol abuse with intoxication, uncomplicated: Secondary | ICD-10-CM | POA: Insufficient documentation

## 2015-08-30 DIAGNOSIS — F10920 Alcohol use, unspecified with intoxication, uncomplicated: Secondary | ICD-10-CM

## 2015-08-30 DIAGNOSIS — Z79899 Other long term (current) drug therapy: Secondary | ICD-10-CM | POA: Insufficient documentation

## 2015-08-30 LAB — COMPREHENSIVE METABOLIC PANEL
ALT: 54 U/L (ref 14–54)
AST: 144 U/L — ABNORMAL HIGH (ref 15–41)
Albumin: 3.8 g/dL (ref 3.5–5.0)
Alkaline Phosphatase: 72 U/L (ref 38–126)
Anion gap: 9 (ref 5–15)
BILIRUBIN TOTAL: 0.4 mg/dL (ref 0.3–1.2)
BUN: <5 mg/dL — ABNORMAL LOW (ref 6–20)
CHLORIDE: 108 mmol/L (ref 101–111)
CO2: 25 mmol/L (ref 22–32)
Calcium: 8.9 mg/dL (ref 8.9–10.3)
Creatinine, Ser: 0.68 mg/dL (ref 0.44–1.00)
Glucose, Bld: 111 mg/dL — ABNORMAL HIGH (ref 65–99)
POTASSIUM: 3.4 mmol/L — AB (ref 3.5–5.1)
Sodium: 142 mmol/L (ref 135–145)
TOTAL PROTEIN: 7.9 g/dL (ref 6.5–8.1)

## 2015-08-30 LAB — URINE DRUG SCREEN, QUALITATIVE (ARMC ONLY)
Amphetamines, Ur Screen: NOT DETECTED
BARBITURATES, UR SCREEN: NOT DETECTED
BENZODIAZEPINE, UR SCRN: NOT DETECTED
Cannabinoid 50 Ng, Ur ~~LOC~~: NOT DETECTED
Cocaine Metabolite,Ur ~~LOC~~: POSITIVE — AB
MDMA (ECSTASY) UR SCREEN: NOT DETECTED
Methadone Scn, Ur: NOT DETECTED
Opiate, Ur Screen: NOT DETECTED
Phencyclidine (PCP) Ur S: NOT DETECTED
TRICYCLIC, UR SCREEN: NOT DETECTED

## 2015-08-30 LAB — ETHANOL
ALCOHOL ETHYL (B): 145 mg/dL — AB (ref <5)
Alcohol, Ethyl (B): 442 mg/dL (ref <5)

## 2015-08-30 LAB — CBC
HCT: 28.4 % — ABNORMAL LOW (ref 35.0–47.0)
Hemoglobin: 9 g/dL — ABNORMAL LOW (ref 12.0–16.0)
MCH: 20.3 pg — ABNORMAL LOW (ref 26.0–34.0)
MCHC: 31.6 g/dL — ABNORMAL LOW (ref 32.0–36.0)
MCV: 64.2 fL — ABNORMAL LOW (ref 80.0–100.0)
Platelets: 319 10*3/uL (ref 150–440)
RBC: 4.43 MIL/uL (ref 3.80–5.20)
RDW: 19.4 % — AB (ref 11.5–14.5)
WBC: 4.6 10*3/uL (ref 3.6–11.0)

## 2015-08-30 LAB — SALICYLATE LEVEL

## 2015-08-30 LAB — VALPROIC ACID LEVEL

## 2015-08-30 LAB — ACETAMINOPHEN LEVEL: Acetaminophen (Tylenol), Serum: <10 ug/mL — ABNORMAL LOW (ref 10–30)

## 2015-08-30 MED ORDER — THIAMINE HCL 100 MG/ML IJ SOLN: 100.0000 mg | Freq: Every day | INTRAMUSCULAR | Status: DC

## 2015-08-30 MED ORDER — CITALOPRAM HYDROBROMIDE 20 MG PO TABS
40.0000 mg | ORAL_TABLET | Freq: Every day | ORAL | Status: DC
  Administered 2015-08-30: 40 mg via ORAL
  Filled 2015-08-30: qty 2

## 2015-08-30 MED ORDER — VITAMIN B-1 100 MG PO TABS
100.0000 mg | ORAL_TABLET | Freq: Every day | ORAL | Status: DC
  Administered 2015-08-30: 100 mg via ORAL
  Filled 2015-08-30: qty 1

## 2015-08-30 MED ORDER — LORAZEPAM 1 MG PO TABS
ORAL_TABLET | ORAL | Status: AC
  Filled 2015-08-30: qty 1

## 2015-08-30 MED ORDER — TRAZODONE HCL 50 MG PO TABS
150.0000 mg | ORAL_TABLET | Freq: Every day | ORAL | Status: DC
  Administered 2015-08-30: 150 mg via ORAL

## 2015-08-30 MED ORDER — LORAZEPAM 2 MG PO TABS
0.0000 mg | ORAL_TABLET | Freq: Two times a day (BID) | ORAL | Status: DC
Start: 2015-09-01 — End: 2015-08-31

## 2015-08-30 MED ORDER — LORAZEPAM 1 MG PO TABS: 1.0000 mg | ORAL_TABLET | Freq: Four times a day (QID) | ORAL | Status: DC | PRN

## 2015-08-30 MED ORDER — LORAZEPAM 2 MG/ML IJ SOLN: 1.0000 mg | Freq: Once | INTRAMUSCULAR | Status: DC

## 2015-08-30 MED ORDER — TRAZODONE HCL 100 MG PO TABS
ORAL_TABLET | ORAL | Status: AC
  Filled 2015-08-30: qty 1

## 2015-08-30 MED ORDER — LORAZEPAM 2 MG/ML IJ SOLN
1.0000 mg | Freq: Once | INTRAMUSCULAR | Status: AC
  Administered 2015-08-30: 1 mg via INTRAVENOUS
  Filled 2015-08-30: qty 1

## 2015-08-30 MED ORDER — ADULT MULTIVITAMIN W/MINERALS CH
1.0000 | ORAL_TABLET | Freq: Every day | ORAL | Status: DC
  Administered 2015-08-30: 1 via ORAL
  Filled 2015-08-30 (×2): qty 1

## 2015-08-30 MED ORDER — TRAZODONE HCL 50 MG PO TABS
ORAL_TABLET | ORAL | Status: AC
  Filled 2015-08-30: qty 1

## 2015-08-30 MED ORDER — LORAZEPAM 2 MG PO TABS
0.0000 mg | ORAL_TABLET | Freq: Four times a day (QID) | ORAL | Status: DC
Start: 2015-08-30 — End: 2015-08-31
  Administered 2015-08-30: 1 mg via ORAL

## 2015-08-30 MED ORDER — LORAZEPAM 2 MG/ML IJ SOLN: 1.0000 mg | Freq: Four times a day (QID) | INTRAMUSCULAR | Status: DC | PRN

## 2015-08-30 MED ORDER — FOLIC ACID 1 MG PO TABS
1.0000 mg | ORAL_TABLET | Freq: Every day | ORAL | Status: DC
  Administered 2015-08-30: 1 mg via ORAL
  Filled 2015-08-30: qty 1

## 2015-08-30 MED ORDER — DIVALPROEX SODIUM 250 MG PO DR TAB
250.0000 mg | DELAYED_RELEASE_TABLET | Freq: Two times a day (BID) | ORAL | Status: DC
  Administered 2015-08-30: 250 mg via ORAL

## 2015-08-30 MED ORDER — DIVALPROEX SODIUM 250 MG PO DR TAB
DELAYED_RELEASE_TABLET | ORAL | Status: AC
  Administered 2015-08-30: 250 mg via ORAL
  Filled 2015-08-30: qty 1

## 2015-08-30 MED ORDER — ONDANSETRON 4 MG PO TBDP
ORAL_TABLET | ORAL | Status: AC
  Filled 2015-08-30: qty 1

## 2015-08-30 MED ORDER — ONDANSETRON 8 MG PO TBDP
4.0000 mg | ORAL_TABLET | Freq: Three times a day (TID) | ORAL | Status: DC | PRN
  Administered 2015-08-30: 4 mg via ORAL

## 2015-08-30 MED ORDER — LORAZEPAM 2 MG/ML IJ SOLN
0.5000 mg | Freq: Once | INTRAMUSCULAR | Status: AC
  Administered 2015-08-30: 0.5 mg via INTRAVENOUS
  Filled 2015-08-30: qty 1

## 2015-08-30 MED ORDER — IBUPROFEN 800 MG PO TABS: 400.0000 mg | ORAL_TABLET | Freq: Four times a day (QID) | ORAL | Status: DC | PRN

## 2015-08-30 NOTE — ED Notes (Signed)
Pt. Noted sleeping in room. No complaints or concerns voiced. No distress or abnormal behavior noted. Will continue to monitor with security cameras. Q 15 minute rounds continue. 

## 2015-08-30 NOTE — ED Notes (Signed)
Ginger ale given

## 2015-08-30 NOTE — BH Assessment (Signed)
Assessment Note  Kara Lawrence is an 39 y.o. female presenting to the ED via [facility].  Per EMS reports, clerk at [employer] states patient told them she wanted to "kill herself." Patient agitated, using profanity, and tearful during. Patient reports had an argument with boyfriend and states "I want to kill myself." Pt denies any other drug use.  Pt also denies A/V hallucinations.    Axis I: Alcohol Abuse  Past Medical History:  Past Medical History  Diagnosis Date  . Seizure   . Bipolar 1 disorder   . Manic depression     Past Surgical History  Procedure Laterality Date  . Tubal ligation Bilateral     Family History: No family history on file.  Social History:  reports that she has never smoked. She has never used smokeless tobacco. She reports that she drinks alcohol. She reports that she does not use illicit drugs.  Additional Social History:  Alcohol / Drug Use History of alcohol / drug use?: Yes Withdrawal Symptoms: Agitation  CIWA: CIWA-Ar BP: (!) 150/112 mmHg Pulse Rate: (!) 107 COWS:    Allergies: No Known Allergies  Home Medications:  (Not in a hospital admission)  OB/GYN Status:  No LMP recorded.  General Assessment Data Location of Assessment: [facility] ED TTS Assessment: In system Is this a Tele or Face-to-Face Assessment?: Face-to-Face Is this an Initial Assessment or a Re-assessment for this encounter?: Initial Assessment Marital status: Single Maiden name: N/A Is patient pregnant?: No Pregnancy Status: No Living Arrangements: Other relatives Can pt return to current living arrangement?: Yes Admission Status: Voluntary Is patient capable of signing voluntary admission?: Yes Referral Source: Self/Family/Friend Insurance type: None  Medical Screening Exam [facility] Walk-in ONLY) Medical Exam completed: Yes  Crisis Care Plan Living Arrangements: Other relatives Name of Psychiatrist: Dr. Carman Ching Name of Therapist: Dr. Carman Ching  Education  Status Is patient currently in school?: No Current Grade: unkown Highest grade of school patient has completed: unknown Name of school: N/A Contact person: N/A  Risk to self with the past 6 months Suicidal Ideation: Yes-Currently Present Has patient been a risk to self within the past 6 months prior to admission? : No Suicidal Intent: Yes-Currently Present Has patient had any suicidal intent within the past 6 months prior to admission? : No Is patient at risk for suicide?: Yes Suicidal Plan?: Yes-Currently Present Has patient had any suicidal plan within the past 6 months prior to admission? : Yes Specify Current Suicidal Plan: Pt reports plan to step out in front of traffic Access to Means: Yes Specify Access to Suicidal Means: traffic What has been your use of drugs/alcohol within the last 12 months?: beer Previous Attempts/Gestures: No How many times?: 0 Other Self Harm Risks: None Triggers for Past Attempts: None known Intentional Self Injurious Behavior: None Family Suicide History: Unknown Recent stressful life event(s): Financial Problems Persecutory voices/beliefs?: No Depression Symptoms: Insomnia, Tearfulness, Feeling worthless/self pity Substance abuse history and/or treatment for substance abuse?: Yes Suicide prevention information given to non-admitted patients: Not applicable  Risk to Others within the past 6 months Homicidal Ideation: No Does patient have any lifetime risk of violence toward others beyond the six months prior to admission? : No Thoughts of Harm to Others: No Current Homicidal Intent: No Current Homicidal Plan: No Access to Homicidal Means: No Identified Victim: None reported History of harm to others?: No Assessment of Violence: None Noted Violent Behavior Description: None reported Does patient have access to weapons?: No Criminal Charges  Pending?: No Does patient have a court date: No Is patient on probation?:  No  Psychosis Hallucinations: None noted Delusions: None noted  Mental Status Report Appearance/Hygiene: In hospital gown Eye Contact: Fair Motor Activity: Restlessness, Agitation Speech: Slurred, Aggressive Level of Consciousness: Combative, Irritable, Crying Mood: Angry Affect: Angry, Irritable Anxiety Level: Minimal Thought Processes: Circumstantial Judgement: Impaired Orientation: Person, Place Obsessive Compulsive Thoughts/Behaviors: Minimal  Cognitive Functioning Concentration: Good Memory: Recent Intact IQ: Average Insight: Poor Impulse Control: Poor Appetite: Poor Weight Loss: 0 Weight Gain: 0 Sleep: Decreased Total Hours of Sleep: 2 Vegetative Symptoms: None  ADLScreening [address] [facility] Assessment Services) Patient's cognitive ability adequate to safely complete daily activities?: Yes Patient able to express need for assistance with ADLs?: Yes Independently performs ADLs?: Yes (appropriate for developmental age)  Prior Inpatient Therapy Prior Inpatient Therapy: No  Prior Outpatient Therapy Prior Outpatient Therapy: Yes Prior Therapy Dates: 08/03/2015 Prior Therapy Facilty/Provider(s): [facility] Reason for Treatment: Anxiety Does patient have an ACCT team?: No Does patient have Intensive In-House Services?  : No Does patient have [facility] services? : No Does patient have P4CC services?: No  ADL Screening (condition at time of admission) Patient's cognitive ability adequate to safely complete daily activities?: Yes Patient able to express need for assistance with ADLs?: Yes Independently performs ADLs?: Yes (appropriate for developmental age)       Abuse/Neglect Assessment (Assessment to be complete while patient is alone) Physical Abuse: Denies Verbal Abuse: Denies Sexual Abuse: Denies Exploitation of patient/patient's resources: Denies Self-Neglect: Denies Values / Beliefs Cultural Requests During Hospitalization: None Spiritual Requests During  Hospitalization: None Consults Spiritual Care Consult Needed: No Social Work Consult Needed: No Merchant navy officer (For Healthcare) Does patient have an advance directive?: No Would patient like information on creating an advanced directive?: No - patient declined information    Additional Information 1:1 In Past 12 Months?: No CIRT Risk: No Elopement Risk: No     Disposition:  Disposition Initial Assessment Completed for this Encounter: Yes Disposition of Patient: Other dispositions Other disposition(s): Other (Comment) (Psych MD consult)  On Site Evaluation by:   Reviewed with Physician:    [place] 08/30/2015 5:27 AM

## 2015-08-30 NOTE — ED Notes (Signed)
Patient yelling and cussing at this time, RN notified of patients behavior. Patient requesting that a different aide sit with her.

## 2015-08-30 NOTE — ED Notes (Signed)
ENVIRONMENTAL ASSESSMENT Potentially harmful objects out of patient reach: YES Personal belongings secured: YES Patient dressed in hospital provided attire only: YES Plastic bags out of patient reach: YES Patient care equipment (cords, cables, call bells, lines, and drains) shortened, removed, or accounted for: NO 1:1 sitter at bedside to ensure patient safety. Equipment and supplies removed from bottom of stretcher: YES Potentially toxic materials out of patient reach: [place] container removed or out of patient reach: YES

## 2015-08-30 NOTE — ED Notes (Signed)
BEHAVIORAL HEALTH ROUNDING Patient sleeping: No. Patient alert and oriented: yes Behavior appropriate: No.; If no, describe: pt cursing and verbally threatening at sitter Nutrition and fluids offered: Yes  Toileting and hygiene offered: Yes  Sitter present: yes Law enforcement present: Yes

## 2015-08-30 NOTE — ED Notes (Signed)
Pt refused to keep BP cuff on arm to obtain BP at this time

## 2015-08-30 NOTE — ED Notes (Signed)

## 2015-08-30 NOTE — Consult Note (Signed)
[facility] Face-to-Face Psychiatry Consult   Reason for Consult:  Consult for this 39 year old woman with a history of bipolar disorder and substance abuse who came to the emergency room with a chief complaint "I'm suicidal" Referring Physician:  Cinda Quest Patient Identification: Kara Lawrence MRN:  [id number] Principal Diagnosis: Bipolar disorder, now depressed Diagnosis:   Patient Active Problem List   Diagnosis Date Noted  . Bipolar disorder, now depressed [F31.30] 08/30/2015  . Alcohol abuse [F10.10] 08/30/2015  . Cocaine abuse [F14.10] 08/30/2015    Total Time spent with patient: 1 hour  Subjective:   Kara Lawrence is a 39 y.o. female patient admitted with "I'm suicidal".  HPI:  Information from the patient and the chart. Patient came to the emergency room last night stating that she was suicidal. She evidently had had a fight with her boyfriend. Decided that she wanted to die. Mood is been feeling depressed and down. Sleep is poor. Appetite is okay. She is also under a lot of stress because she has no stable place to stay right now. She has relapsed back into drinking and had a blood alcohol level of 400 on admission. She can't tell me how long she is been back drinking. She denies that she's using drugs but her drug screening is positive for cocaine. Denies any auditory or visual hallucinations. Denies that she's actually done anything to try to kill her self.  Past psychiatric history: Long psychiatric history of mood instability with multiple hospitalizations. No frank suicide attempts. Has been stabilized on Depakote and Celexa in the past. He is supposed to be following up with Dr. Randel Books. She claimed that she's taking her medicine but her Depakote level was 0 when she came in.  Social history: Unmarried. Had been living with her grandmother. Works at [employer]. Says that now she has no stable place to live.  Medical history: No significant ongoing medical  problems identified outside of the mental health problems.  Family history: Patient is unable to identify any right now.  Substance abuse history: History about call abuse. Denies seizures no clear history of DTs. Minimizes drug use but has a long history of intermittent cocaine abuse. Doesn't engage in substance abuse treatment outside the hospital. HPI Elements:   Quality:  Depressed mood with suicidal ideation. Severity:  Severe and life-threatening. Timing:  Worse today. Duration:  Chronic intermittent ongoing. Context:  Breakup or fight with her boyfriend and substance abuse.  Past Medical History:  Past Medical History  Diagnosis Date  . Seizure   . Bipolar 1 disorder   . Manic depression     Past Surgical History  Procedure Laterality Date  . Tubal ligation Bilateral    Family History: No family history on file. Social History:  History  Alcohol Use  . Yes    Comment: pt smells strongly of ETOH     History  Drug Use No    Social History   Social History  . Marital Status: Single    Spouse Name: N/A  . Number of Children: N/A  . Years of Education: N/A   Social History Main Topics  . Smoking status: Never Smoker   . Smokeless tobacco: Never Used  . Alcohol Use: Yes     Comment: pt smells strongly of ETOH  . Drug Use: No  . Sexual Activity: Yes    Birth Control/ Protection: None   Other Topics Concern  . None   Social History Narrative   Additional Social  History:    History of alcohol / drug use?: Yes Withdrawal Symptoms: Agitation                     Allergies:  No Known Allergies  Labs:  Results for orders placed or performed during the hospital encounter of 08/30/15 (from the past 48 hour(s))  Comprehensive metabolic panel     Status: Abnormal   Collection Time: 08/30/15  3:03 AM  Result Value Ref Range   Sodium 142 135 - 145 mmol/L   Potassium 3.4 (L) 3.5 - 5.1 mmol/L   Chloride 108 101 - 111 mmol/L   CO2 25 22 - 32 mmol/L    Glucose, Bld 111 (H) 65 - 99 mg/dL   BUN <5 (L) 6 - 20 mg/dL   Creatinine, Ser 0.68 0.44 - 1.00 mg/dL   Calcium 8.9 8.9 - 10.3 mg/dL   Total Protein 7.9 6.5 - 8.1 g/dL   Albumin 3.8 3.5 - 5.0 g/dL   AST 144 (H) 15 - 41 U/L   ALT 54 14 - 54 U/L   Alkaline Phosphatase 72 38 - 126 U/L   Total Bilirubin 0.4 0.3 - 1.2 mg/dL   GFR calc non Af Amer >60 >60 mL/min   GFR calc Af Amer >60 >60 mL/min    Comment: (NOTE) The eGFR has been calculated using the CKD EPI equation. This calculation has not been validated in all clinical situations. eGFR's persistently <60 mL/min signify possible Chronic Kidney Disease.    Anion gap 9 5 - 15  Ethanol (ETOH)     Status: Abnormal   Collection Time: 08/30/15  3:03 AM  Result Value Ref Range   Alcohol, Ethyl (B) 442 (HH) <5 mg/dL    Comment: CRITICAL RESULT CALLED TO, READ BACK BY AND VERIFIED WITH GININA RODRIGUEZ 08/30/2015 0431 [facility]        LOWEST DETECTABLE LIMIT FOR SERUM ALCOHOL IS 5 mg/dL FOR MEDICAL PURPOSES ONLY   Salicylate level     Status: None   Collection Time: 08/30/15  3:03 AM  Result Value Ref Range   Salicylate Lvl <8.0 2.8 - 30.0 mg/dL  Acetaminophen level     Status: Abnormal   Collection Time: 08/30/15  3:03 AM  Result Value Ref Range   Acetaminophen (Tylenol), Serum <10 (L) 10 - 30 ug/mL    Comment:        THERAPEUTIC CONCENTRATIONS VARY SIGNIFICANTLY. A RANGE OF 10-30 ug/mL MAY BE AN EFFECTIVE CONCENTRATION FOR MANY PATIENTS. HOWEVER, SOME ARE BEST TREATED AT CONCENTRATIONS OUTSIDE THIS RANGE. ACETAMINOPHEN CONCENTRATIONS >150 ug/mL AT 4 HOURS AFTER INGESTION AND >50 ug/mL AT 12 HOURS AFTER INGESTION ARE OFTEN ASSOCIATED WITH TOXIC REACTIONS.   CBC     Status: Abnormal   Collection Time: 08/30/15  3:03 AM  Result Value Ref Range   WBC 4.6 3.6 - 11.0 K/uL   RBC 4.43 3.80 - 5.20 MIL/uL   Hemoglobin 9.0 (L) 12.0 - 16.0 g/dL   HCT 28.4 (L) 35.0 - 47.0 %   MCV 64.2 (L) 80.0 - 100.0 fL   MCH 20.3 (L) 26.0 - 34.0 pg    MCHC 31.6 (L) 32.0 - 36.0 g/dL   RDW 19.4 (H) 11.5 - 14.5 %   Platelets 319 150 - 440 K/uL  Urine Drug Screen, Qualitative ([facility] only)     Status: Abnormal   Collection Time: 08/30/15  3:03 AM  Result Value Ref Range   Tricyclic, Ur Screen NONE DETECTED NONE DETECTED  Amphetamines, Ur Screen NONE DETECTED NONE DETECTED   MDMA (Ecstasy)Ur Screen NONE DETECTED NONE DETECTED   Cocaine Metabolite,Ur [place] POSITIVE (A) NONE DETECTED   Opiate, Ur Screen NONE DETECTED NONE DETECTED   Phencyclidine (PCP) Ur S NONE DETECTED NONE DETECTED   Cannabinoid 50 Ng, Ur [place] NONE DETECTED NONE DETECTED   Barbiturates, Ur Screen NONE DETECTED NONE DETECTED   Benzodiazepine, Ur Scrn NONE DETECTED NONE DETECTED   Methadone Scn, Ur NONE DETECTED NONE DETECTED    Comment: (NOTE) 374  Tricyclics, urine               Cutoff 1000 ng/mL 200  Amphetamines, urine             Cutoff 1000 ng/mL 300  MDMA (Ecstasy), urine           Cutoff 500 ng/mL 400  Cocaine Metabolite, urine       Cutoff 300 ng/mL 500  Opiate, urine                   Cutoff 300 ng/mL 600  Phencyclidine (PCP), urine      Cutoff 25 ng/mL 700  Cannabinoid, urine              Cutoff 50 ng/mL 800  Barbiturates, urine             Cutoff 200 ng/mL 900  Benzodiazepine, urine           Cutoff 200 ng/mL 1000 Methadone, urine                Cutoff 300 ng/mL 1100 1200 The urine drug screen provides only a preliminary, unconfirmed 1300 analytical test result and should not be used for non-medical 1400 purposes. Clinical consideration and professional judgment should 1500 be applied to any positive drug screen result due to possible 1600 interfering substances. A more specific alternate chemical method 1700 must be used in order to obtain a confirmed analytical result.  1800 Gas chromato graphy / mass spectrometry (GC/MS) is the preferred 1900 confirmatory method.   Valproic acid level     Status: Abnormal   Collection Time: 08/30/15  3:03 AM  Result  Value Ref Range   Valproic Acid Lvl <10 (L) 50.0 - 100.0 ug/mL    Comment: CONFIRMED BY MANUAL DILUTION  Ethanol     Status: Abnormal   Collection Time: 08/30/15  1:06 PM  Result Value Ref Range   Alcohol, Ethyl (B) 145 (H) <5 mg/dL    Comment:        LOWEST DETECTABLE LIMIT FOR SERUM ALCOHOL IS 5 mg/dL FOR MEDICAL PURPOSES ONLY     Vitals: Blood pressure 150/112, pulse 93, temperature 98.3 F (36.8 C), temperature source Oral, resp. rate 18, height 5' 5" (1.651 m), weight 55.929 kg (123 lb 4.8 oz), SpO2 96 %.  Risk to Self: Suicidal Ideation: Yes-Currently Present Suicidal Intent: Yes-Currently Present Is patient at risk for suicide?: Yes Suicidal Plan?: Yes-Currently Present Specify Current Suicidal Plan: Pt reports plan to step out in front of traffic Access to Means: Yes Specify Access to Suicidal Means: traffic What has been your use of drugs/alcohol within the last 12 months?: beer How many times?: 0 Other Self Harm Risks: None Triggers for Past Attempts: None known Intentional Self Injurious Behavior: None Risk to Others: Homicidal Ideation: No Thoughts of Harm to Others: No Current Homicidal Intent: No Current Homicidal Plan: No Access to Homicidal Means: No Identified Victim: None reported History of harm to others?:  No Assessment of Violence: None Noted Violent Behavior Description: None reported Does patient have access to weapons?: No Criminal Charges Pending?: No Does patient have a court date: No Prior Inpatient Therapy: Prior Inpatient Therapy: No Prior Outpatient Therapy: Prior Outpatient Therapy: Yes Prior Therapy Dates: 08/03/2015 Prior Therapy Facilty/Provider(s): [facility] Reason for Treatment: Anxiety Does patient have an ACCT team?: No Does patient have Intensive In-House Services?  : No Does patient have [facility] services? : No Does patient have [facility] services?: No  Current Facility-Administered Medications  Medication Dose Route Frequency Provider  Last Rate Last Dose  . citalopram (CELEXA) tablet 40 mg  40 mg Oral Daily John T Clapacs, MD      . divalproex (DEPAKOTE) DR tablet 250 mg  250 mg Oral Q12H Gonzella Lex, MD      . folic acid (FOLVITE) tablet 1 mg  1 mg Oral Daily John T Clapacs, MD      . LORazepam (ATIVAN) tablet 1 mg  1 mg Oral Q6H PRN Gonzella Lex, MD       Or  . LORazepam (ATIVAN) injection 1 mg  1 mg Intravenous Q6H PRN Gonzella Lex, MD      . LORazepam (ATIVAN) tablet 0-4 mg  0-4 mg Oral Q6H Gonzella Lex, MD       Followed by  . [START ON 09/01/2015] LORazepam (ATIVAN) tablet 0-4 mg  0-4 mg Oral Q12H Gonzella Lex, MD      . multivitamin with minerals tablet 1 tablet  1 tablet Oral Daily John T Clapacs, MD      . thiamine (VITAMIN B-1) tablet 100 mg  100 mg Oral Daily Gonzella Lex, MD       Or  . thiamine (B-1) injection 100 mg  100 mg Intravenous Daily Gonzella Lex, MD      . traZODone (DESYREL) tablet 150 mg  150 mg Oral QHS Gonzella Lex, MD       Current Outpatient Prescriptions  Medication Sig Dispense Refill  . citalopram (CELEXA) 40 MG tablet Take 40 mg by mouth daily.    . divalproex (DEPAKOTE ER) 250 MG 24 hr tablet Take 250 mg by mouth 2 (two) times daily.    . traZODone (DESYREL) 150 MG tablet Take by mouth at bedtime.      Musculoskeletal: Strength & Muscle Tone: decreased Gait & Station: unsteady Patient leans: N/A  Psychiatric Specialty Exam: Physical Exam  Nursing note and vitals reviewed. Constitutional: She appears well-developed and well-nourished.  HENT:  Head: Normocephalic and atraumatic.  Eyes: Conjunctivae are normal. Pupils are equal, round, and reactive to light.  Neck: Normal range of motion.  Cardiovascular: Normal heart sounds.   Respiratory: Effort normal.  GI: Soft.  Musculoskeletal: Normal range of motion.  Neurological: She is alert.  Skin: Skin is warm and dry.  Psychiatric: Her mood appears anxious. Her affect is blunt and labile. Her speech is slurred.  She is slowed and withdrawn. Cognition and memory are impaired. She expresses impulsivity. She exhibits a depressed mood. She expresses suicidal ideation. She is noncommunicative. She exhibits abnormal recent memory and abnormal remote memory.    Review of Systems  Constitutional: Negative.   HENT: Negative.   Eyes: Negative.   Respiratory: Negative.   Cardiovascular: Negative.   Gastrointestinal: Negative.   Musculoskeletal: Negative.   Skin: Negative.   Neurological: Negative.   Psychiatric/Behavioral: Positive for depression, suicidal ideas, memory loss and substance abuse. Negative for hallucinations. The patient is  nervous/anxious and has insomnia.     Blood pressure 150/112, pulse 93, temperature 98.3 F (36.8 C), temperature source Oral, resp. rate 18, height 5' 5" (1.651 m), weight 55.929 kg (123 lb 4.8 oz), SpO2 96 %.Body mass index is 20.52 kg/(m^2).  General Appearance: Disheveled  Eye Contact::  Minimal  Speech:  Slurred  Volume:  Decreased  Mood:  Depressed  Affect:  Blunt  Thought Process:  Disorganized  Orientation:  Full (Time, Place, and Person)  Thought Content:  Negative  Suicidal Thoughts:  Yes.  with intent/plan  Homicidal Thoughts:  No  Memory:  Immediate;   Fair Recent;   Poor Remote;   Poor  Judgement:  Impaired  Insight:  Lacking  Psychomotor Activity:  Decreased  Concentration:  Poor  Recall:  Poor  Fund of Knowledge:Fair  Language: Fair  Akathisia:  No  Handed:  Right  AIMS (if indicated):     Assets:  Desire for Improvement Physical Health Resilience  ADL's:  Intact  Cognition: WNL  Sleep:      Medical Decision Making: Review of Psycho-Social Stressors (1), Review or order clinical lab tests (1), Established Problem, Worsening (2), Review of Medication Regimen & Side Effects (2) and Review of New Medication or Change in Dosage (2)  Treatment Plan Summary: Daily contact with patient to assess and evaluate symptoms and progress in treatment,  Medication management and Plan Patient will be admitted to the psychiatry ward. Her alcohol level has come down to 145 this afternoon. Her vital signs are stable. She will be continued on Depakote and Celexa and trazodone. Close precautions in place. I will also order detox protocol medication. Treatment team downstairs can work with her on further stabilization and outpatient referral.  Plan:  Recommend psychiatric Inpatient admission when medically cleared. Supportive therapy provided about ongoing stressors. Disposition: Admit to psychiatry  Alethia Berthold 08/30/2015 5:48 PM

## 2015-08-30 NOTE — ED Provider Notes (Addendum)
[facility] Emergency Department Provider Note  ____________________________________________  Time seen: Approximately 4:21 AM  I have reviewed the triage vital signs and the nursing notes.   HISTORY  Chief Complaint Aggressive Behavior  History limited by intoxication  HPI Kara Lawrence is a 39 y.o. female who presents to the ED via EMS from convenience store for agitation, using profanity and stating to the clerk she wanted to "kill her self" because she had an argument with her boyfriend. Patient is known to the ED and has had several prior similar presentations. Arrives agitated, cursing but able to be verbally redirected.Denies intentional ingestion or self-harm. Does admit to continued thoughts of suicide without plan.   Past Medical History  Diagnosis Date  . Seizure   . Bipolar 1 disorder   . Manic depression     There are no active problems to display for this patient.   Past Surgical History  Procedure Laterality Date  . Tubal ligation Bilateral     Current Outpatient Rx  Name  Route  Sig  Dispense  Refill  . citalopram (CELEXA) 40 MG tablet   Oral   Take 40 mg by mouth daily.         . divalproex (DEPAKOTE ER) 250 MG 24 hr tablet   Oral   Take 250 mg by mouth 2 (two) times daily.         . traZODone (DESYREL) 150 MG tablet   Oral   Take by mouth at bedtime.           Allergies Review of patient's allergies indicates no known allergies.  No family history on file.  Social History Social History  Substance Use Topics  . Smoking status: Never Smoker   . Smokeless tobacco: Never Used  . Alcohol Use: Yes     Comment: pt smells strongly of ETOH    Review of Systems Constitutional: No fever/chills Eyes: No visual changes. ENT: No sore throat. Cardiovascular: Denies chest pain. Respiratory: Denies shortness of breath. Gastrointestinal: No abdominal pain.  No nausea, no vomiting.  No diarrhea.  No  constipation. Genitourinary: Negative for dysuria. Musculoskeletal: Negative for back pain. Skin: Negative for rash. Neurological: Negative for headaches, focal weakness or numbness. Psychiatric:Positive for depression with suicidal ideation.  Limited by intoxication; 10-point ROS otherwise negative.  ____________________________________________   PHYSICAL EXAM:  VITAL SIGNS: ED Triage Vitals  Enc Vitals Group     BP 08/30/15 0252 150/112 mmHg     Pulse Rate 08/30/15 0252 107     Resp 08/30/15 0252 20     Temp 08/30/15 0252 98.2 F (36.8 C)     Temp Source 08/30/15 0252 Oral     SpO2 08/30/15 0252 96 %     Weight 08/30/15 0252 123 lb 4.8 oz (55.929 kg)     Height 08/30/15 0252  (1.651 m)     Head Cir --      Peak Flow --      Pain Score --      Pain Loc --      Pain Edu? --      Excl. in GC? --     Constitutional: Alert and oriented. Tearful, agitated and in mild acute distress. Eyes: Conjunctivae are normal. PERRL. EOMI. Head: Atraumatic. Nose: No congestion/rhinnorhea. Mouth/Throat: Mucous membranes are moist.  Oropharynx non-erythematous. Neck: No stridor.   Cardiovascular: Normal rate, regular rhythm. Grossly normal heart sounds.  Good peripheral circulation. Respiratory: Normal respiratory effort.  No retractions.  Lungs CTAB. Gastrointestinal: Soft and nontender. No distention. No abdominal bruits. No CVA tenderness. Musculoskeletal: No lower extremity tenderness nor edema.  No joint effusions. Neurologic:  Normal speech and language. No gross focal neurologic deficits are appreciated. No gait instability. Skin:  Skin is warm, dry and intact. No rash noted. Psychiatric: Mood and affect are agitated. Speech and behavior are agitated.  ____________________________________________   LABS (all labs ordered are listed, but only abnormal results are displayed)  Labs Reviewed  COMPREHENSIVE METABOLIC PANEL - Abnormal; Notable for the following:    Potassium  3.4 (*)    Glucose, Bld 111 (*)    BUN <5 (*)    AST 144 (*)    All other components within normal limits  ETHANOL - Abnormal; Notable for the following:    Alcohol, Ethyl (B) 442 (*)    All other components within normal limits  ACETAMINOPHEN LEVEL - Abnormal; Notable for the following:    Acetaminophen (Tylenol), Serum <10 (*)    All other components within normal limits  CBC - Abnormal; Notable for the following:    Hemoglobin 9.0 (*)    HCT 28.4 (*)    MCV 64.2 (*)    MCH 20.3 (*)    MCHC 31.6 (*)    RDW 19.4 (*)    All other components within normal limits  URINE DRUG SCREEN, QUALITATIVE ([facility] ONLY) - Abnormal; Notable for the following:    Cocaine Metabolite,Ur [place] POSITIVE (*)    All other components within normal limits  VALPROIC ACID LEVEL - Abnormal; Notable for the following:    Valproic Acid Lvl <10 (*)    All other components within normal limits  SALICYLATE LEVEL   ____________________________________________  EKG  none ____________________________________________  RADIOLOGY  none ____________________________________________   PROCEDURES  Procedure(s) performed: None  Critical Care performed: No  ____________________________________________   INITIAL IMPRESSION / ASSESSMENT AND PLAN / ED COURSE  Pertinent labs & imaging results that were available during my care of the patient were reviewed by me and considered in my medical decision making (see chart for details).  39 year old female who presents from a local convenience store intoxicated, agitated and verbally stating she wants to kill her self secondary to an argument with her boyfriend. Will place patient under IVC for her protection. Initiate IV fluid resuscitation, consult TTS and psychiatry to evaluate patient in the emergency department.  ----------------------------------------- 7:38 AM on 08/30/2015 -----------------------------------------  Patient resting in no acute distress.  Sitter at bedside. Continue sitter as patient has past history of violence and self-harm. Awaiting psychiatry consult this morning. Care transferred to Dr. Cyril Loosen. ____________________________________________   FINAL CLINICAL IMPRESSION(S) / ED DIAGNOSES  Final diagnoses:  Alcohol intoxication, uncomplicated  Depression      Irean Hong, MD 08/30/15 1610  Irean Hong, MD 08/31/15 [id number]

## 2015-08-30 NOTE — ED Notes (Signed)
BEHAVIORAL HEALTH ROUNDING Patient sleeping: NO Patient alert and oriented: YES Behavior appropriate: YES Describe behavior: No inappropriate or unacceptable behaviors noted at this time.  Nutrition and fluids offered: YES Toileting and hygiene offered: YES Sitter present: 1:1 sitter ensure patient safety.  Law enforcement present: Loss adjuster, chartered agency: [employer], television/film set (ODS)

## 2015-08-30 NOTE — ED Notes (Signed)
BEHAVIORAL HEALTH ROUNDING Patient sleeping: Yes.   Patient alert and oriented: sleeping Behavior appropriate: Yes.  ; If no, describe:  Nutrition and fluids offered: sleeping Toileting and hygiene offered: sleeping Sitter present: yes Law enforcement present: Yes  

## 2015-08-30 NOTE — ED Notes (Signed)
Patient presents to ED via [facility], EMS reports clerk at [employer] states patient told them she wanted to "kill herself." Patient agitated, using profanity, and tearful during triage. Patient reports had an argument with boyfriend and states "I want to kill myself." Patient alert, no increased work in breathing noted.

## 2015-08-30 NOTE — ED Notes (Addendum)
Pt resting. Meal given and pt did eat half. Pt request to keep meal tray to finish later. Pt denies pain at this time. Sitter remains at bedside.

## 2015-08-30 NOTE — Consult Note (Signed)
  Psychiatry: Consult for 39 year old woman with bipolar disorder and a history of substance abuse. Presented to the emergency room stating she was suicidal. She is described as having initially been agitated. When I came to see her today she had received medication and was sedated. She was able to tell me that she wanted to die but then fell back asleep. I couldn't get her to wake up to engage in a more complete conversation.  Vitals and labs reviewed. Her blood pressure is running a little high. Her labs show a negative valproic acid level so she hasn't been compliant with that. Alcohol level was over 400. Drug screen positive for cocaine.  I will reevaluate the patient later today. Once she is awake and alert we can consider admission plans. I have ordered a stat redraw of her alcohol level to make sure that it has come down to safer levels.

## 2015-08-30 NOTE — ED Notes (Signed)
Pt. To BHU from ED ambulatory without difficulty, to room 2 . Report from [facility]. Pt. Is alert and oriented, warm and dry in no distress. Pt. Denies SI, HI, and AVH. Pt. Calm and cooperative. Pt. Made aware of security cameras and Q15 minute rounds. Pt. Encouraged to let Nursing staff know of any concerns or needs.

## 2015-08-30 NOTE — ED Notes (Signed)
Pt. Vomited mod. Amount or undigested food room. No acute distress or abnormal behavior noted. Will medicate and continue to monitor with security cameras. Q 15 minute rounds continue.

## 2015-08-30 NOTE — ED Notes (Signed)
BEHAVIORAL HEALTH ROUNDING Patient sleeping: YES Patient alert and oriented: YES Behavior appropriate: YES Describe behavior: No inappropriate or unacceptable behaviors noted at this time.  Nutrition and fluids offered: YES Toileting and hygiene offered: YES Sitter present: 1:1 sitter present to ensure patient safety.  Law enforcement present: Loss adjuster, chartered agency: [employer], television/film set (ODS)

## 2015-08-30 NOTE — ED Notes (Signed)
ED BHU PLACEMENT JUSTIFICATION Is the patient under IVC or is there intent for IVC: Yes.   Is the patient medically cleared: Yes.   Is there vacancy in the ED BHU: Yes.   Is the population mix appropriate for patient: No. Is the patient awaiting placement in inpatient or outpatient setting: Yes.   Has the patient had a psychiatric consult: Yes.   Survey of unit performed for contraband, proper placement and condition of furniture, tampering with fixtures in bathroom, shower, and each patient room: Yes.  ; Findings:  APPEARANCE/BEHAVIOR uncooperative NEURO ASSESSMENT Orientation: time, person, place Hallucinations: No.None noted (Hallucinations) Speech: Normal Gait: normal RESPIRATORY ASSESSMENT Normal expansion.  Clear to auscultation.  No rales, rhonchi, or wheezing. CARDIOVASCULAR ASSESSMENT regular rate and rhythm, S1, S2 normal, no murmur, click, rub or gallop GASTROINTESTINAL ASSESSMENT soft, nontender, BS WNL, no r/g EXTREMITIES normal strength, tone, and muscle mass PLAN OF CARE Provide calm/safe environment. Vital signs assessed twice daily. ED BHU Assessment once each 12-hour shift. Collaborate with intake RN daily or as condition indicates. Assure the ED provider has rounded once each shift. Provide and encourage hygiene. Provide redirection as needed. Assess for escalating behavior; address immediately and inform ED provider.  Assess family dynamic and appropriateness for visitation as needed: Yes.  ; If necessary, describe findings:  Educate the patient/family about BHU procedures/visitation: Yes.  ; If necessary, describe findings:

## 2015-08-30 NOTE — ED Notes (Signed)
Pt. C/o nausea. 

## 2015-08-30 NOTE — ED Notes (Signed)
Pt. To [facility] from ED ambulatory without difficulty, to room 2  . Report from [facility]. Pt. Is alert and oriented, warm and dry in no distress. Pt. Denies SI, HI, and AVH. Pt. Calm and cooperative. Pt. Made aware of security cameras and Q15 minute rounds. Pt. Encouraged to let Nursing staff know of any concerns or needs.

## 2015-08-31 ENCOUNTER — Inpatient Hospital Stay
Admit: 2015-08-31 | Discharge: 2015-09-03 | DRG: 897 | Disposition: A | Discharge status: Home / Self Care | Payer: No Typology Code available for payment source | Attending: Psychiatry | Admitting: Psychiatry

## 2015-08-31 DIAGNOSIS — F1094 Alcohol use, unspecified with alcohol-induced mood disorder: Secondary | ICD-10-CM | POA: Diagnosis not present

## 2015-08-31 DIAGNOSIS — F10939 Alcohol use, unspecified with withdrawal, unspecified: Secondary | ICD-10-CM

## 2015-08-31 DIAGNOSIS — R45851 Suicidal ideations: Secondary | ICD-10-CM | POA: Diagnosis present

## 2015-08-31 DIAGNOSIS — F10239 Alcohol dependence with withdrawal, unspecified: Secondary | ICD-10-CM | POA: Diagnosis present

## 2015-08-31 DIAGNOSIS — F1510 Other stimulant abuse, uncomplicated: Secondary | ICD-10-CM | POA: Insufficient documentation

## 2015-08-31 DIAGNOSIS — F3289 Other specified depressive episodes: Secondary | ICD-10-CM

## 2015-08-31 DIAGNOSIS — G4700 Insomnia, unspecified: Secondary | ICD-10-CM | POA: Diagnosis present

## 2015-08-31 DIAGNOSIS — F1024 Alcohol dependence with alcohol-induced mood disorder: Principal | ICD-10-CM | POA: Diagnosis present

## 2015-08-31 DIAGNOSIS — Z9851 Tubal ligation status: Secondary | ICD-10-CM

## 2015-08-31 DIAGNOSIS — F1014 Alcohol abuse with alcohol-induced mood disorder: Secondary | ICD-10-CM

## 2015-08-31 DIAGNOSIS — F1590 Other stimulant use, unspecified, uncomplicated: Secondary | ICD-10-CM

## 2015-08-31 DIAGNOSIS — F1420 Cocaine dependence, uncomplicated: Secondary | ICD-10-CM | POA: Diagnosis present

## 2015-08-31 DIAGNOSIS — F1020 Alcohol dependence, uncomplicated: Secondary | ICD-10-CM

## 2015-08-31 LAB — URINALYSIS COMPLETE WITH MICROSCOPIC (ARMC ONLY)
BILIRUBIN URINE: NEGATIVE
Bacteria, UA: NONE SEEN
Glucose, UA: NEGATIVE mg/dL
Hgb urine dipstick: NEGATIVE
Leukocytes, UA: NEGATIVE
Nitrite: NEGATIVE
PH: 7 (ref 5.0–8.0)
Protein, ur: NEGATIVE mg/dL
RBC / HPF: NONE SEEN RBC/hpf (ref 0–5)
Specific Gravity, Urine: 1.02 (ref 1.005–1.030)

## 2015-08-31 MED ORDER — ALUM & MAG HYDROXIDE-SIMETH 200-200-20 MG/5ML PO SUSP
30.0000 mL | ORAL | Status: DC | PRN
  Administered 2015-09-01: 30 mL via ORAL
  Filled 2015-08-31: qty 30

## 2015-08-31 MED ORDER — LORAZEPAM 2 MG PO TABS: 0.0000 mg | ORAL_TABLET | Freq: Two times a day (BID) | ORAL | Status: DC

## 2015-08-31 MED ORDER — LORAZEPAM 2 MG PO TABS: 2.0000 mg | ORAL_TABLET | Freq: Three times a day (TID) | ORAL | Status: DC | PRN

## 2015-08-31 MED ORDER — FOLIC ACID 1 MG PO TABS
1.0000 mg | ORAL_TABLET | Freq: Every day | ORAL | Status: DC
  Administered 2015-09-01 – 2015-09-03 (×3): 1 mg via ORAL
  Filled 2015-08-31 (×4): qty 1

## 2015-08-31 MED ORDER — CITALOPRAM HYDROBROMIDE 20 MG PO TABS
40.0000 mg | ORAL_TABLET | Freq: Every day | ORAL | Status: DC
  Administered 2015-09-01 – 2015-09-03 (×3): 40 mg via ORAL
  Filled 2015-08-31 (×4): qty 2

## 2015-08-31 MED ORDER — LORAZEPAM 2 MG/ML IJ SOLN: 1.0000 mg | Freq: Four times a day (QID) | INTRAMUSCULAR | Status: DC | PRN

## 2015-08-31 MED ORDER — CHLORDIAZEPOXIDE HCL 25 MG PO CAPS
50.0000 mg | ORAL_CAPSULE | Freq: Four times a day (QID) | ORAL | Status: DC
  Administered 2015-08-31 – 2015-09-02 (×9): 50 mg via ORAL
  Filled 2015-08-31 (×10): qty 2

## 2015-08-31 MED ORDER — ADULT MULTIVITAMIN W/MINERALS CH
1.0000 | ORAL_TABLET | Freq: Every day | ORAL | Status: DC
  Administered 2015-09-01 – 2015-09-03 (×3): 1 via ORAL
  Filled 2015-08-31 (×4): qty 1

## 2015-08-31 MED ORDER — ACETAMINOPHEN 325 MG PO TABS: 650.0000 mg | ORAL_TABLET | Freq: Four times a day (QID) | ORAL | Status: DC | PRN

## 2015-08-31 MED ORDER — THIAMINE HCL 100 MG/ML IJ SOLN: 100.0000 mg | Freq: Every day | INTRAMUSCULAR | Status: DC

## 2015-08-31 MED ORDER — LORAZEPAM 2 MG PO TABS: 0.0000 mg | ORAL_TABLET | Freq: Four times a day (QID) | ORAL | Status: DC

## 2015-08-31 MED ORDER — DIVALPROEX SODIUM 250 MG PO DR TAB: 250.0000 mg | DELAYED_RELEASE_TABLET | Freq: Two times a day (BID) | ORAL | Status: DC

## 2015-08-31 MED ORDER — TRAZODONE HCL 50 MG PO TABS
150.0000 mg | ORAL_TABLET | Freq: Every day | ORAL | Status: DC
  Administered 2015-08-31 – 2015-09-01 (×2): 150 mg via ORAL
  Filled 2015-08-31 (×2): qty 1

## 2015-08-31 MED ORDER — LORAZEPAM 1 MG PO TABS: 1.0000 mg | ORAL_TABLET | Freq: Four times a day (QID) | ORAL | Status: DC | PRN

## 2015-08-31 MED ORDER — MAGNESIUM HYDROXIDE 400 MG/5ML PO SUSP: 30.0000 mL | Freq: Every day | ORAL | Status: DC | PRN

## 2015-08-31 MED ORDER — VITAMIN B-1 100 MG PO TABS
100.0000 mg | ORAL_TABLET | Freq: Every day | ORAL | Status: DC
  Administered 2015-09-01 – 2015-09-03 (×3): 100 mg via ORAL
  Filled 2015-08-31 (×4): qty 1

## 2015-08-31 NOTE — Progress Notes (Signed)
D: Patient denies SI/HI/AVH. Patient is alert x 4, thoughts are organized and speech is coherent, mood is irritable, angry and upset but upon approach she brightens and apologized for her behavior. Patient's affect is flat and sad.  Patient is not interacting with peers.  A: Pt was offered support and encouragement. Pt was given scheduled medications. Pt was encouraged to attend groups. Q 15 minute checks were done for safety.  R:Pt did not attend evening group. Patient is compliant with medication. 15 minutes checks maintained will continue to monitor.

## 2015-08-31 NOTE — Progress Notes (Signed)
Recreation Therapy Notes  Date: 09.16.16 Time: 3:00 pm Location: Craft Room  Group Topic: Coping Skills  Goal Area(s) Addresses:  Patient will participate in healthy coping skill. Patient will verbalize at least one emotion experienced during group.  Behavioral Response: Did not attend  Intervention: Coloring  Activity: Patients were instructed to color either mandalas or other coloring sheets and focus on the emotions they were experiencing.  Education: LRT educated patients on different coping skills.  Education Outcome: Patient did not attend group.  Clinical Observations/Feedback: Patient did not attend group.  Jacquelynn Cree, LRT/CTRS 08/31/2015 4:45 PM

## 2015-08-31 NOTE — ED Notes (Signed)
Pt. Noted sleeping in room. No complaints or concerns voiced. No distress or abnormal behavior noted. Will continue to monitor with security cameras. Q 15 minute rounds continue. 

## 2015-08-31 NOTE — Plan of Care (Signed)
Problem: Consults Goal: [facility] General Treatment Patient Education Outcome: Not Progressing Educated patient on medications prescribed and about general care/treatment plan.  Patient not progressing with education.  Patient refusing to get out of bed and particpiate in care plan.

## 2015-08-31 NOTE — Progress Notes (Signed)
Admission note:  D: patient came from ED with a hx of bipolar and substance abuse.  Patient came to the ED with the chief complaint of being suicidal.  Patient states she is not currently having any SI/HI at this time.  Patient is irritable and anxious.  Patient would not answer any questions at this time and is refusing to take prescribed medications.  Patient has been in bed for most of the morning.  Patient attended no groups. A: skin and belongings search completed and no contraband found.  Encourage and support offered to patient.  Went over care/ treatment plan with patient.  Encouraged patient to attend groups and when to let staff know if symptoms are worsening.  R: patient no receptive of the information, patient is refusing care and medications at this time.

## 2015-08-31 NOTE — [facility] Group Notes (Signed)
[facility] LCSW Aftercare Discharge Planning Group Note   08/31/2015 1:09 PM  Participation Quality: Did not attend.   Candace L Hyatt, MSW, LCSWA  

## 2015-08-31 NOTE — [facility] Suicide Risk Assessment (Signed)
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 Alcohol use disorder, severe, dependence [F10.20] 08/31/2015  . Alcohol withdrawal [F10.239] 08/31/2015  . Stimulant use disorder (cocaine) [F15.99] 08/31/2015  . 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  World Science writer [facility]). Score between 0-7:  no or low risk or alcohol related problems. Score between 8-15:  moderate risk of alcohol related problems. Score between 16-19:  high risk of alcohol related problems. Score 20 or above:  warrants further diagnostic evaluation for alcohol dependence and treatment.   CLINICAL FACTORS:   Depression:   Comorbid alcohol abuse/dependence Impulsivity Alcohol/Substance Abuse/Dependencies Personality Disorders:   Cluster B Comorbid alcohol abuse/dependence Comorbid depression Previous Psychiatric Diagnoses and Treatments    Psychiatric Specialty Exam: Physical Exam  ROS    COGNITIVE FEATURES THAT CONTRIBUTE TO RISK:  None    SUICIDE RISK:   Moderate:  Frequent suicidal ideation with limited intensity, and duration, some specificity in terms of plans, no associated intent, good self-control, limited dysphoria/symptomatology, some risk factors present, and identifiable protective factors, including available and accessible social support.  PLAN OF CARE: admit to [facility]  Medical Decision Making:  Established Problem, Worsening (2)  I certify that inpatient services  furnished can reasonably be expected to improve the patient's condition.   Kara Lawrence 08/31/2015, 2:09 PM

## 2015-08-31 NOTE — Plan of Care (Signed)
Problem: Ineffective individual coping Goal: LTG: Patient will report a decrease in negative feelings Outcome: Progressing Patient currently denies SI/HI.  Goal: STG: Patient will remain free from self harm Outcome: Progressing 15 minutes maintained will continue to monitor.

## 2015-08-31 NOTE — Tx Team (Signed)
Initial Interdisciplinary Treatment Plan   PATIENT STRESSORS: Medication change or noncompliance Substance abuse fight with boyfriend    PATIENT STRENGTHS: Capable of independent living Physical Health Supportive family/friends   PROBLEM LIST: Problem List/Patient Goals Date to be addressed Date deferred Reason deferred Estimated date of resolution  Bipolar disorder  08/31/2015     Depressed  08/31/2015     Alcohol abuse  08/31/2015     Cocaine abuse  08/31/2015                                    DISCHARGE CRITERIA:  Improved stabilization in mood, thinking, and/or behavior Medical problems require only outpatient monitoring  PRELIMINARY DISCHARGE PLAN: Return to previous living arrangement  PATIENT/FAMIILY INVOLVEMENT: This treatment plan has been presented to and reviewed with the patient, MURLEAN SEELYE.  The patient and family have been given the opportunity to ask questions and make suggestions.  Crissie Reese 08/31/2015, 9:40 AM

## 2015-08-31 NOTE — Progress Notes (Signed)
Recreation Therapy Notes  At approximately 11:25 am, LRT attempted assessment. Patient's nurse reported patient was not cooperating and refusing. Patient's nurse stated patient was still intoxicated.  Jacquelynn Cree, LRT/CTRS 08/31/2015 4:58 PM

## 2015-08-31 NOTE — ED Notes (Signed)

## 2015-08-31 NOTE — ED Notes (Signed)
Pt given breakfast tray. Vitals taken. 

## 2015-08-31 NOTE — ED Provider Notes (Signed)
-----------------------------------------   12:13 AM on 08/31/2015 -----------------------------------------   Blood pressure 118/77, pulse 87, temperature 98.3 F (36.8 C), temperature source Oral, resp. rate 16, height  (1.651 m), weight 123 lb 4.8 oz (55.929 kg), SpO2 97 %.  The patient had no acute events since last update.  Calm and cooperative at this time.  Patient is admitted to the behavioral health unit.   Irean Hong, MD 08/31/15 [phone]

## 2015-08-31 NOTE — H&P (Signed)
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 Alcohol use disorder, severe, dependence [F10.20] 08/31/2015  . Alcohol withdrawal [F10.239] 08/31/2015  . Stimulant use disorder (cocaine) [F15.99] 08/31/2015  . 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 female who presented to the ED via EMS on 9/14 from [facility] store for agitation, using profanity and stating to the clerk she wanted to "kill her self" because she had an argument with her boyfriend. Patient is known to the ED and has had several prior similar presentations. Arrived agitated, cursing but able to be verbally redirected.Denies intentional ingestion or self-harm. Does admit to continued thoughts of suicide without plan. Alcohol level at arrival was >400 and urine toxicology was positive for cocaine.  Per ER psychiatrist: "She evidently had had a fight with her boyfriend. Decided that she wanted to die. Mood is been feeling depressed and down. Sleep is poor. Appetite is okay. She is also under a lot of stress because she has no stable place to stay right now. She has relapsed back into drinking. She can't tell me how long she is been back drinking. She denies that she's using drugs but her drug screening is positive for cocaine. Denies any auditory or visual hallucinations. Denies that she's actually done anything to try to kill her self."  Today patient was uncooperative with assessment did not want to talk to nursing or myself. Most of the information in this initial assessment was obtained from her medical record.   Substance abuse history: History about call  abuse. Denies seizures no clear history of DTs. Minimizes drug use but has a long history of intermittent cocaine abuse. Doesn't engage in substance abuse treatment outside the hospital.  HPI Elements: Quality: Depressed mood with suicidal ideation. Severity: Severe and life-threatening. Timing: Worse today. Duration: Chronic intermittent ongoing. Context: Breakup or fight with her boyfriend and substance abuse.   Total Time spent with patient: 1 hour   Past psychiatric history: Long psychiatric history of mood instability with multiple hospitalizations. No frank suicide attempts. Has been stabilized on Depakote and Celexa in the past. He is supposed to be following up with Dr. Randel Books. She claimed that she's taking her medicine but her Depakote level was 0 when she came in.   Past Medical History:  Past Medical History  Diagnosis Date  . Seizure   . Bipolar 1 disorder   . Manic depression     Past Surgical History  Procedure Laterality Date  . Tubal ligation Bilateral    Family History: History reviewed. No pertinent family history.   Social History: Had been living with her grandmother. Works at [employer]. Says that now she has no stable place to live. History  Alcohol Use  . Yes    Comment: pt smells strongly of ETOH     History  Drug Use No    Social History   Social History  . Marital Status: Single    Spouse Name: N/A  . Number of Children: N/A  . Years of Education: N/A   Social History Main Topics  . Smoking status: Never Smoker   . Smokeless tobacco: Never Used  . Alcohol Use: Yes  Comment: pt smells strongly of ETOH  . Drug Use: No  . Sexual Activity: Yes    Birth Control/ Protection: None   Other Topics Concern  . None   Social History Narrative     Musculoskeletal: Strength & Muscle Tone: within normal limits Gait & Station: normal Patient leans: N/A  Psychiatric Specialty Exam: Physical Exam  Review of Systems   Constitutional: Negative.   HENT: Negative.   Eyes: Negative.   Respiratory: Negative.   Cardiovascular: Negative.   Gastrointestinal: Negative.   Genitourinary: Negative.   Musculoskeletal: Negative.   Skin: Negative.   Neurological: Negative.   Endo/Heme/Allergies: Negative.   Psychiatric/Behavioral: Positive for depression, suicidal ideas and substance abuse.    Blood pressure 113/91, pulse 93, temperature 98.4 F (36.9 C), temperature source Oral, resp. rate 18, height _0  (1.651 m), weight 57.607 kg (127 lb), SpO2 99 %.Body mass index is 21.13 kg/(m^2).  General Appearance: Disheveled  Eye Contact::  Minimal  Speech:  Slow  Volume:  Decreased  Mood:  Irritable  Affect:  Constricted  Thought Process:  Linear and Logical  Orientation:  Full (Time, Place, and Person)  Thought Content:  Hallucinations: None  Suicidal Thoughts:  Yes.  without intent/plan  Homicidal Thoughts:  No  Memory:  Immediate;   Good Recent;   Good Remote;   Good  Judgement:  Poor  Insight:  Lacking  Psychomotor Activity:  Decreased  Concentration:  NA  Recall:  NA  Fund of Knowledge:Good  Language: Good  Akathisia:  No  Handed:    AIMS (if indicated):     Assets:  Armed forces logistics/support/administrative officer Physical Health  ADL's:  Intact  Cognition: WNL  Sleep:      Physical examination per emergency room: Constitutional: Alert and oriented. Tearful, agitated and in mild acute distress. Eyes: Conjunctivae are normal. PERRL. EOMI. Head: Atraumatic. Nose: No congestion/rhinnorhea. Mouth/Throat: Mucous membranes are moist. Oropharynx non-erythematous. Neck: No stridor.  Cardiovascular: Normal rate, regular rhythm. Grossly normal heart sounds. Good peripheral circulation. Respiratory: Normal respiratory effort. No retractions. Lungs CTAB. Gastrointestinal: Soft and nontender. No distention. No abdominal bruits. No CVA tenderness. Musculoskeletal: No lower extremity tenderness nor edema. No joint  effusions. Neurologic: Normal speech and language. No gross focal neurologic deficits are appreciated. No gait instability. Skin: Skin is warm, dry and intact. No rash noted. Psychiatric: Mood and affect are agitated. Speech and behavior are agitated.  Allergies:  No Known Allergies   Lab Results:  Results for orders placed or performed during the hospital encounter of 08/30/15 (from the past 48 hour(s))  Comprehensive metabolic panel     Status: Abnormal   Collection Time: 08/30/15  3:03 AM  Result Value Ref Range   Sodium 142 135 - 145 mmol/L   Potassium 3.4 (L) 3.5 - 5.1 mmol/L   Chloride 108 101 - 111 mmol/L   CO2 25 22 - 32 mmol/L   Glucose, Bld 111 (H) 65 - 99 mg/dL   BUN <5 (L) 6 - 20 mg/dL   Creatinine, Ser 0.68 0.44 - 1.00 mg/dL   Calcium 8.9 8.9 - 10.3 mg/dL   Total Protein 7.9 6.5 - 8.1 g/dL   Albumin 3.8 3.5 - 5.0 g/dL   AST 144 (H) 15 - 41 U/L   ALT 54 14 - 54 U/L   Alkaline Phosphatase 72 38 - 126 U/L   Total Bilirubin 0.4 0.3 - 1.2 mg/dL   GFR calc non Af Amer >60 >60 mL/min   GFR calc Af Amer >  60 >60 mL/min    Comment: (NOTE) The eGFR has been calculated using the CKD EPI equation. This calculation has not been validated in all clinical situations. eGFR's persistently <60 mL/min signify possible Chronic Kidney Disease.    Anion gap 9 5 - 15  Ethanol (ETOH)     Status: Abnormal   Collection Time: 08/30/15  3:03 AM  Result Value Ref Range   Alcohol, Ethyl (B) 442 (HH) <5 mg/dL    Comment: CRITICAL RESULT CALLED TO, READ BACK BY AND VERIFIED WITH GININA RODRIGUEZ 08/30/2015 0431 [facility]        LOWEST DETECTABLE LIMIT FOR SERUM ALCOHOL IS 5 mg/dL FOR MEDICAL PURPOSES ONLY   Salicylate level     Status: None   Collection Time: 08/30/15  3:03 AM  Result Value Ref Range   Salicylate Lvl <3.4 2.8 - 30.0 mg/dL  Acetaminophen level     Status: Abnormal   Collection Time: 08/30/15  3:03 AM  Result Value Ref Range   Acetaminophen (Tylenol), Serum <10 (L) 10 - 30  ug/mL    Comment:        THERAPEUTIC CONCENTRATIONS VARY SIGNIFICANTLY. A RANGE OF 10-30 ug/mL MAY BE AN EFFECTIVE CONCENTRATION FOR MANY PATIENTS. HOWEVER, SOME ARE BEST TREATED AT CONCENTRATIONS OUTSIDE THIS RANGE. ACETAMINOPHEN CONCENTRATIONS >150 ug/mL AT 4 HOURS AFTER INGESTION AND >50 ug/mL AT 12 HOURS AFTER INGESTION ARE OFTEN ASSOCIATED WITH TOXIC REACTIONS.   CBC     Status: Abnormal   Collection Time: 08/30/15  3:03 AM  Result Value Ref Range   WBC 4.6 3.6 - 11.0 K/uL   RBC 4.43 3.80 - 5.20 MIL/uL   Hemoglobin 9.0 (L) 12.0 - 16.0 g/dL   HCT 28.4 (L) 35.0 - 47.0 %   MCV 64.2 (L) 80.0 - 100.0 fL   MCH 20.3 (L) 26.0 - 34.0 pg   MCHC 31.6 (L) 32.0 - 36.0 g/dL   RDW 19.4 (H) 11.5 - 14.5 %   Platelets 319 150 - 440 K/uL  Urine Drug Screen, Qualitative ([facility] only)     Status: Abnormal   Collection Time: 08/30/15  3:03 AM  Result Value Ref Range   Tricyclic, Ur Screen NONE DETECTED NONE DETECTED   Amphetamines, Ur Screen NONE DETECTED NONE DETECTED   MDMA (Ecstasy)Ur Screen NONE DETECTED NONE DETECTED   Cocaine Metabolite,Ur [place] POSITIVE (A) NONE DETECTED   Opiate, Ur Screen NONE DETECTED NONE DETECTED   Phencyclidine (PCP) Ur S NONE DETECTED NONE DETECTED   Cannabinoid 50 Ng, Ur [place] NONE DETECTED NONE DETECTED   Barbiturates, Ur Screen NONE DETECTED NONE DETECTED   Benzodiazepine, Ur Scrn NONE DETECTED NONE DETECTED   Methadone Scn, Ur NONE DETECTED NONE DETECTED    Comment: (NOTE) 196  Tricyclics, urine               Cutoff 1000 ng/mL 200  Amphetamines, urine             Cutoff 1000 ng/mL 300  MDMA (Ecstasy), urine           Cutoff 500 ng/mL 400  Cocaine Metabolite, urine       Cutoff 300 ng/mL 500  Opiate, urine                   Cutoff 300 ng/mL 600  Phencyclidine (PCP), urine      Cutoff 25 ng/mL 700  Cannabinoid, urine              Cutoff 50 ng/mL 800  Barbiturates,  urine             Cutoff 200 ng/mL 900  Benzodiazepine, urine           Cutoff 200 ng/mL 1000  Methadone, urine                Cutoff 300 ng/mL 1100 1200 The urine drug screen provides only a preliminary, unconfirmed 1300 analytical test result and should not be used for non-medical 1400 purposes. Clinical consideration and professional judgment should 1500 be applied to any positive drug screen result due to possible 1600 interfering substances. A more specific alternate chemical method 1700 must be used in order to obtain a confirmed analytical result.  1800 Gas chromato graphy / mass spectrometry (GC/MS) is the preferred 1900 confirmatory method.   Valproic acid level     Status: Abnormal   Collection Time: 08/30/15  3:03 AM  Result Value Ref Range   Valproic Acid Lvl <10 (L) 50.0 - 100.0 ug/mL    Comment: CONFIRMED BY MANUAL DILUTION  Ethanol     Status: Abnormal   Collection Time: 08/30/15  1:06 PM  Result Value Ref Range   Alcohol, Ethyl (B) 145 (H) <5 mg/dL    Comment:        LOWEST DETECTABLE LIMIT FOR SERUM ALCOHOL IS 5 mg/dL FOR MEDICAL PURPOSES ONLY    Current Medications: Current Facility-Administered Medications  Medication Dose Route Frequency Ianmichael Amescua Last Rate Last Dose  . acetaminophen (TYLENOL) tablet 650 mg  650 mg Oral Q6H PRN Gonzella Lex, MD      . alum & mag hydroxide-simeth (MAALOX/MYLANTA) 200-200-20 MG/5ML suspension 30 mL  30 mL Oral Q4H PRN Gonzella Lex, MD      . chlordiazePOXIDE (LIBRIUM) capsule 50 mg  50 mg Oral QID Hildred Priest, MD   50 mg at 08/31/15 1140  . citalopram (CELEXA) tablet 40 mg  40 mg Oral Daily Gonzella Lex, MD   40 mg at 08/31/15 1141  . folic acid (FOLVITE) tablet 1 mg  1 mg Oral Daily Gonzella Lex, MD   1 mg at 08/31/15 1141  . LORazepam (ATIVAN) tablet 2 mg  2 mg Oral TID PRN Hildred Priest, MD      . magnesium hydroxide (MILK OF MAGNESIA) suspension 30 mL  30 mL Oral Daily PRN Gonzella Lex, MD      . multivitamin with minerals tablet 1 tablet  1 tablet Oral Daily Gonzella Lex, MD   1  tablet at 08/31/15 1142  . thiamine (VITAMIN B-1) tablet 100 mg  100 mg Oral Daily Gonzella Lex, MD   100 mg at 08/31/15 1142  . traZODone (DESYREL) tablet 150 mg  150 mg Oral QHS Gonzella Lex, MD       PTA Medications: Prescriptions prior to admission  Medication Sig Dispense Refill Last Dose  . citalopram (CELEXA) 40 MG tablet Take 40 mg by mouth daily.   Past Week at Unknown time  . divalproex (DEPAKOTE ER) 250 MG 24 hr tablet Take 250 mg by mouth 2 (two) times daily.   Past Week at Unknown time  . traZODone (DESYREL) 150 MG tablet Take by mouth at bedtime.   Past Week at Unknown time     Results for orders placed or performed during the hospital encounter of 08/30/15 (from the past 72 hour(s))  Comprehensive metabolic panel     Status: Abnormal   Collection Time: 08/30/15  3:03 AM  Result Value Ref  Range   Sodium 142 135 - 145 mmol/L   Potassium 3.4 (L) 3.5 - 5.1 mmol/L   Chloride 108 101 - 111 mmol/L   CO2 25 22 - 32 mmol/L   Glucose, Bld 111 (H) 65 - 99 mg/dL   BUN <5 (L) 6 - 20 mg/dL   Creatinine, Ser 0.68 0.44 - 1.00 mg/dL   Calcium 8.9 8.9 - 10.3 mg/dL   Total Protein 7.9 6.5 - 8.1 g/dL   Albumin 3.8 3.5 - 5.0 g/dL   AST 144 (H) 15 - 41 U/L   ALT 54 14 - 54 U/L   Alkaline Phosphatase 72 38 - 126 U/L   Total Bilirubin 0.4 0.3 - 1.2 mg/dL   GFR calc non Af Amer >60 >60 mL/min   GFR calc Af Amer >60 >60 mL/min    Comment: (NOTE) The eGFR has been calculated using the CKD EPI equation. This calculation has not been validated in all clinical situations. eGFR's persistently <60 mL/min signify possible Chronic Kidney Disease.    Anion gap 9 5 - 15  Ethanol (ETOH)     Status: Abnormal   Collection Time: 08/30/15  3:03 AM  Result Value Ref Range   Alcohol, Ethyl (B) 442 (HH) <5 mg/dL    Comment: CRITICAL RESULT CALLED TO, READ BACK BY AND VERIFIED WITH GININA RODRIGUEZ 08/30/2015 0431 [facility]        LOWEST DETECTABLE LIMIT FOR SERUM ALCOHOL IS 5 mg/dL FOR MEDICAL  PURPOSES ONLY   Salicylate level     Status: None   Collection Time: 08/30/15  3:03 AM  Result Value Ref Range   Salicylate Lvl <5.3 2.8 - 30.0 mg/dL  Acetaminophen level     Status: Abnormal   Collection Time: 08/30/15  3:03 AM  Result Value Ref Range   Acetaminophen (Tylenol), Serum <10 (L) 10 - 30 ug/mL    Comment:        THERAPEUTIC CONCENTRATIONS VARY SIGNIFICANTLY. A RANGE OF 10-30 ug/mL MAY BE AN EFFECTIVE CONCENTRATION FOR MANY PATIENTS. HOWEVER, SOME ARE BEST TREATED AT CONCENTRATIONS OUTSIDE THIS RANGE. ACETAMINOPHEN CONCENTRATIONS >150 ug/mL AT 4 HOURS AFTER INGESTION AND >50 ug/mL AT 12 HOURS AFTER INGESTION ARE OFTEN ASSOCIATED WITH TOXIC REACTIONS.   CBC     Status: Abnormal   Collection Time: 08/30/15  3:03 AM  Result Value Ref Range   WBC 4.6 3.6 - 11.0 K/uL   RBC 4.43 3.80 - 5.20 MIL/uL   Hemoglobin 9.0 (L) 12.0 - 16.0 g/dL   HCT 28.4 (L) 35.0 - 47.0 %   MCV 64.2 (L) 80.0 - 100.0 fL   MCH 20.3 (L) 26.0 - 34.0 pg   MCHC 31.6 (L) 32.0 - 36.0 g/dL   RDW 19.4 (H) 11.5 - 14.5 %   Platelets 319 150 - 440 K/uL  Urine Drug Screen, Qualitative ([facility] only)     Status: Abnormal   Collection Time: 08/30/15  3:03 AM  Result Value Ref Range   Tricyclic, Ur Screen NONE DETECTED NONE DETECTED   Amphetamines, Ur Screen NONE DETECTED NONE DETECTED   MDMA (Ecstasy)Ur Screen NONE DETECTED NONE DETECTED   Cocaine Metabolite,Ur [place] POSITIVE (A) NONE DETECTED   Opiate, Ur Screen NONE DETECTED NONE DETECTED   Phencyclidine (PCP) Ur S NONE DETECTED NONE DETECTED   Cannabinoid 50 Ng, Ur [place] NONE DETECTED NONE DETECTED   Barbiturates, Ur Screen NONE DETECTED NONE DETECTED   Benzodiazepine, Ur Scrn NONE DETECTED NONE DETECTED   Methadone Scn, Ur NONE DETECTED NONE DETECTED  Comment: (NOTE) 443  Tricyclics, urine               Cutoff 1000 ng/mL 200  Amphetamines, urine             Cutoff 1000 ng/mL 300  MDMA (Ecstasy), urine           Cutoff 500 ng/mL 400  Cocaine  Metabolite, urine       Cutoff 300 ng/mL 500  Opiate, urine                   Cutoff 300 ng/mL 600  Phencyclidine (PCP), urine      Cutoff 25 ng/mL 700  Cannabinoid, urine              Cutoff 50 ng/mL 800  Barbiturates, urine             Cutoff 200 ng/mL 900  Benzodiazepine, urine           Cutoff 200 ng/mL 1000 Methadone, urine                Cutoff 300 ng/mL 1100 1200 The urine drug screen provides only a preliminary, unconfirmed 1300 analytical test result and should not be used for non-medical 1400 purposes. Clinical consideration and professional judgment should 1500 be applied to any positive drug screen result due to possible 1600 interfering substances. A more specific alternate chemical method 1700 must be used in order to obtain a confirmed analytical result.  1800 Gas chromato graphy / mass spectrometry (GC/MS) is the preferred 1900 confirmatory method.   Valproic acid level     Status: Abnormal   Collection Time: 08/30/15  3:03 AM  Result Value Ref Range   Valproic Acid Lvl <10 (L) 50.0 - 100.0 ug/mL    Comment: CONFIRMED BY MANUAL DILUTION  Ethanol     Status: Abnormal   Collection Time: 08/30/15  1:06 PM  Result Value Ref Range   Alcohol, Ethyl (B) 145 (H) <5 mg/dL    Comment:        LOWEST DETECTABLE LIMIT FOR SERUM ALCOHOL IS 5 mg/dL FOR MEDICAL PURPOSES ONLY      Treatment Plan Summary: Daily contact with patient to assess and evaluate symptoms and progress in treatment and Medication management  39 year old African-American female with history of mood instability alcohol dependence and cocaine dependence presented to our emergency department intoxicated with an alcohol level of all 400 and voicing suicidal ideation. Currently showing some evidence of withdrawal. Uncooperative with assessment.  Major depressive disorder: Continue Celexa 40 mg by mouth daily which is being prescribed by her outpatient psychiatrist at [facility].  Patient was also prescribed with  Depakote however due to heavy drinking this medication will be discontinued. There is no evidence the patient suffers from bipolar disorder to justify the use of this medication.  Alcohol withdrawal: Patient has been started on a Librium taper, she has been started on Librium 50 mg 4 times a day. Vital signs will be check every 8 hours. She will be offered Ativan 2 mg every 8 hours when necessary if CIWA >15.  Patient also receiving multivitamins and thiamine.  Insomnia: Patient has been started on trazodone 150 mg by mouth  Precautions continue every 15 minute checks along with withdrawal precautions  Discharge disposition: Once a stable patient will be discharged to the homeless shelter and will be set up to follow-up with an outpatient substance abuse program.   Labs: We'll order a urine pregnancy and a UA.  Medical Decision Making:  Established Problem, Worsening (2)  I certify that inpatient services furnished can reasonably be expected to improve the patient's condition.   Hildred Priest 9/16/20161:18 PM

## 2015-08-31 NOTE — ED Notes (Signed)
BEHAVIORAL HEALTH ROUNDING Patient sleeping: No. Patient alert and oriented: yes Behavior appropriate: Yes.  ; If no, describe:  Nutrition and fluids offered: Yes  Toileting and hygiene offered: Yes  Sitter present: no Law enforcement present: Yes  

## 2015-08-31 NOTE — [facility] Group Notes (Signed)
[facility] Group Notes:  (Nursing/MHT/Case Management/Adjunct)  Date:  08/31/2015  Time:  11:56 AM  Type of Therapy:  Psychoeducational Skills  Participation Level:  Did Not Attend  P  Mickey Farber 08/31/2015, 11:56 AM

## 2015-09-01 DIAGNOSIS — F1094 Alcohol use, unspecified with alcohol-induced mood disorder: Secondary | ICD-10-CM

## 2015-09-01 LAB — TSH: TSH: 3.893 u[IU]/mL (ref 0.350–4.500)

## 2015-09-01 NOTE — [facility] Group Notes (Signed)
[facility] LCSW Group Therapy  09/01/2015 3:30 PM  Type of Therapy:  Group Therapy  Participation Level:  Did Not Attend  Modes of Intervention:  Discussion, Education, Socialization and Support  Summary of Progress/Problems:Balance in life: Patients will discuss the concept of balance and how it looks and feels to be unbalanced. Pt will identify areas in their life that is unbalanced and ways to become more balanced.    Candace L Hyatt MSW, LCSWA  09/01/2015, 3:30 PM 

## 2015-09-01 NOTE — [facility] Group Notes (Signed)
[facility] Group Notes:  (Nursing/MHT/Case Management/Adjunct)  Date:  09/01/2015  Time:  10:00 PM  Type of Therapy:  Evening Wrap-up Group  Participation Level:  Did Not Attend  Participation Quality:  N/A  Affect: N/A  Cognitive:  N/A  Insight:  None  Engagement in Group:  None  Modes of Intervention:  Activity  Summary of Progress/Problems:  Tomasita Morrow 09/01/2015, 10:00 PM

## 2015-09-01 NOTE — Progress Notes (Signed)
Kara Lawrence Progress Note  09/01/2015 12:53 PM Kara Lawrence  MRN:  [id number] Subjective:   Kara Lawrence is a 39 y.o. female who presented to the ED via EMS on 9/14 from [facility] store for agitation, using profanity and stating to the clerk she wanted to "kill her self" because she had an argument with her boyfriend. Patient is known to the ED and has had several prior similar presentations. Alcohol level at arrival was >400 and urine toxicology was positive for cocaine. Interview patient reported that she is feeling very tired because she was drinking too much. She is trying to rest. She reported that she wants to stay in her room so she does not have any withdrawal symptoms. She appeared very tired during the interview. She currently denied having any withdrawal symptoms and denied having any auditory or visual hallucinations. She stated that she does not want to elaborate anything during this interview.      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 Alcohol use disorder, severe, dependence [F10.20] 08/31/2015  . Alcohol withdrawal [F10.239] 08/31/2015  . Stimulant use disorder (cocaine) [F15.99] 08/31/2015  . 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 Seizure   . Bipolar 1 disorder   . Manic depression     Past Surgical History  Procedure Laterality Date  . Tubal ligation Bilateral    Family History: History reviewed. No pertinent family history. Social History:  History  Alcohol Use  . Yes    Comment: pt smells strongly of ETOH     History  Drug Use No    Social History   Social History  . Marital Status: Single    Spouse Name: N/A  . Number of Children: N/A  . Years of Education: N/A   Social  History Main Topics  . Smoking status: Never Smoker   . Smokeless tobacco: Never Used  . Alcohol Use: Yes     Comment: pt smells strongly of ETOH  . Drug Use: No  . Sexual Activity: Yes    Birth Control/ Protection: None   Other Topics Concern  . None   Social History Narrative   Additional History:    Sleep: Fair  Appetite:  Fair   Assessment:   Musculoskeletal: Strength & Muscle Tone: within normal limits Gait & Station: normal Patient leans: N/A   Psychiatric Specialty Exam: Physical Exam  Review of Systems  Neurological: Positive for tremors.  Psychiatric/Behavioral: Positive for depression, suicidal ideas and substance abuse. The patient is nervous/anxious.   All other systems reviewed and are negative.   Blood pressure 106/76, pulse 97, temperature 98.4 F (36.9 C), temperature source Oral, resp. rate 18, height 5\' 5"  (1.651 m), weight 127 lb (57.607 kg), SpO2 99 %.Body mass index is 21.13 kg/(m^2).  General Appearance: Casual and Fairly Groomed  Patent attorney::  Fair  Speech:  Clear and Coherent  Volume:  Decreased  Mood:  Anxious and Depressed  Affect:  Congruent  Thought Process:  Circumstantial  Orientation:  Full (Time, Place, and Person)  Thought Content:  WDL  Suicidal Thoughts:  No  Homicidal Thoughts:  No  Memory:  Immediate;   Fair  Judgement:  Fair  Insight:  Shallow  Psychomotor Activity:  Psychomotor Retardation  Concentration:  Fair  Recall:  Kara Lawrence of Knowledge:Fair  Language: Fair  Akathisia:  No  Handed:  Right  AIMS (if indicated):     Assets:  Communication Skills Desire for Improvement Physical Health  ADL's:  Intact  Cognition: WNL  Sleep:  Number of Hours: 7.15     Current Medications: Current Facility-Administered Medications  Medication Dose Route Frequency Provider Last Rate Last Dose  . acetaminophen (TYLENOL) tablet 650 mg  650 mg Oral Q6H PRN Audery Amel, Lawrence      . alum & mag hydroxide-simeth  (MAALOX/MYLANTA) 200-200-20 MG/5ML suspension 30 mL  30 mL Oral Q4H PRN Audery Amel, Lawrence   30 mL at 09/01/15 0409  . chlordiazePOXIDE (LIBRIUM) capsule 50 mg  50 mg Oral QID [name], Lawrence   50 mg at 09/01/15 1610  . citalopram (CELEXA) tablet 40 mg  40 mg Oral Daily Audery Amel, Lawrence   40 mg at 09/01/15 0937  . folic acid (FOLVITE) tablet 1 mg  1 mg Oral Daily Audery Amel, Lawrence   1 mg at 09/01/15 9604  . LORazepam (ATIVAN) tablet 2 mg  2 mg Oral TID PRN [name], Lawrence      . magnesium hydroxide (MILK OF MAGNESIA) suspension 30 mL  30 mL Oral Daily PRN Audery Amel, Lawrence      . multivitamin with minerals tablet 1 tablet  1 tablet Oral Daily Audery Amel, Lawrence   1 tablet at 09/01/15 [phone]  . thiamine (VITAMIN B-1) tablet 100 mg  100 mg Oral Daily Audery Amel, Lawrence   100 mg at 09/01/15 8119  . traZODone (DESYREL) tablet 150 mg  150 mg Oral QHS Audery Amel, Lawrence   150 mg at 08/31/15 2211    Lab Results:  Results for orders placed or performed during the hospital encounter of 08/31/15 (from the past 48 hour(s))  Urinalysis complete, with microscopic ([facility] only)     Status: Abnormal   Collection Time: 08/31/15  2:51 PM  Result Value Ref Range   Color, Urine YELLOW (A) YELLOW   APPearance HAZY (A) CLEAR   Glucose, UA NEGATIVE NEGATIVE mg/dL   Bilirubin Urine NEGATIVE NEGATIVE   Ketones, ur 1+ (A) NEGATIVE mg/dL   Specific Gravity, Urine 1.020 1.005 - 1.030   Hgb urine dipstick NEGATIVE NEGATIVE   pH 7.0 5.0 - 8.0   Protein, ur NEGATIVE NEGATIVE mg/dL   Nitrite NEGATIVE NEGATIVE   Leukocytes, UA NEGATIVE NEGATIVE   RBC / HPF NONE SEEN 0 - 5 RBC/hpf   WBC, UA 0-5 0 - 5 WBC/hpf   Bacteria, UA NONE SEEN NONE SEEN   Squamous Epithelial / LPF 0-5 (A) NONE SEEN   Mucous PRESENT    Amorphous Crystal PRESENT   TSH     Status: None   Collection Time: 09/01/15  6:08 AM  Result Value Ref Range   TSH 3.893 0.350 - 4.500 uIU/mL    Physical Findings: AIMS: Facial  and Oral Movements Muscles of Facial Expression: None, normal Lips and Perioral Area: None, normal Jaw: None, normal Tongue: None, normal,Extremity Movements Upper (arms, wrists, hands, fingers): None, normal Lower (legs, knees, ankles, toes): None, normal, Trunk Movements Neck, shoulders, hips: None, normal, Overall Severity Severity of abnormal movements (highest score from questions above): None, normal Incapacitation due to abnormal movements: None, normal Patient's awareness of abnormal movements (rate only patient's report): No Awareness, Dental Status Current problems with teeth and/or dentures?: No Does patient usually wear  dentures?: No  CIWA:  CIWA-Ar Total: 2 COWS:     Treatment Plan Summary: Daily contact with patient to assess and evaluate symptoms and progress in treatment and Medication management   Medical Decision Making:    Daily contact with patient to assess and evaluate symptoms and progress in treatment and Medication management    Major depressive disorder:  Continue Celexa 40 mg by mouth daily   Alcohol withdrawal: Patient has been started on a Librium taper, she has been started on Librium 50 mg 4 times a day.   She will be offered Ativan 2 mg every 8 hours when necessary if CIWA >15.  Patient also receiving multivitamins and thiamine.  Insomnia: Patient has been started on trazodone 150 mg by mouth  Precautions continue every 15 minute checks along with withdrawal precautions  Discharge disposition: Once a stable patient will be discharged to the homeless shelter and will be set up to follow-up with an outpatient substance abuse program.        Brandy Hale 09/01/2015, 12:53 PM

## 2015-09-01 NOTE — Progress Notes (Signed)
Pt has been seclusive to her room except at meal time. Pt's mood and affect has been depressed. Pt denies SI and A/v hallucinations.Pt c/o being tired . Pt is able to contract for safety. No withdawal s/s noted. Pt noted to be slightly irritable at times.

## 2015-09-02 MED ORDER — LORAZEPAM 1 MG PO TABS: 1.0000 mg | ORAL_TABLET | ORAL | Status: DC | PRN

## 2015-09-02 MED ORDER — LORAZEPAM 2 MG/ML IJ SOLN: 1.0000 mg | INTRAMUSCULAR | Status: DC | PRN

## 2015-09-02 MED ORDER — CHLORDIAZEPOXIDE HCL 25 MG PO CAPS
25.0000 mg | ORAL_CAPSULE | Freq: Four times a day (QID) | ORAL | Status: DC
  Administered 2015-09-02 – 2015-09-03 (×2): 25 mg via ORAL
  Filled 2015-09-02 (×2): qty 1

## 2015-09-02 MED ORDER — TRAZODONE HCL 100 MG PO TABS: 100.0000 mg | ORAL_TABLET | Freq: Every evening | ORAL | Status: DC | PRN

## 2015-09-02 NOTE — Progress Notes (Signed)
[facility] MD Progress Note  09/02/2015 6:25 PM Kara Lawrence  MRN:  [id number] Subjective:   Kara Lawrence is a 39 y.o. female who presented to the ED via EMS on 9/14 from convenience store for agitation, using profanity and stating to the clerk she wanted to "kill her self" because she had an argument with her boyfriend. She was tired and was laying in the bed. She reported that she is currently experiencing withdrawal from the alcohol. However she currently denied having any perceptual disturbances. She has been on Librium to help with the withdrawal symptoms. She reported that she is sleeping well. She denied having any perceptual disturbances.     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 Alcohol use disorder, severe, dependence [F10.20] 08/31/2015  . Alcohol withdrawal [F10.239] 08/31/2015  . Stimulant use disorder (cocaine) [F15.99] 08/31/2015  . 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 Seizure   . Bipolar 1 disorder   . Manic depression     Past Surgical History  Procedure Laterality Date  . Tubal ligation Bilateral    Family History: History reviewed. No pertinent family history. Social History:  History  Alcohol Use  . Yes    Comment: pt smells strongly of ETOH     History  Drug Use No    Social History   Social History  . Marital Status: Single    Spouse Name: N/A  . Number of Children: N/A  . Years of Education: N/A   Social History Main Topics  . Smoking status: Never Smoker   . Smokeless tobacco: Never Used  . Alcohol Use: Yes     Comment: pt smells strongly of ETOH  . Drug Use: No  . Sexual Activity: Yes    Birth Control/ Protection: None   Other Topics Concern  . None   Social  History Narrative   Additional History:    Sleep: Fair  Appetite:  Fair   Assessment:   Musculoskeletal: Strength & Muscle Tone: within normal limits Gait & Station: normal Patient leans: N/A   Psychiatric Specialty Exam: Physical Exam   Review of Systems  Neurological: Positive for tremors.  Psychiatric/Behavioral: Positive for depression, suicidal ideas and substance abuse. The patient is nervous/anxious.   All other systems reviewed and are negative.   Blood pressure 87/62, pulse 108, temperature 98.4 F (36.9 C), temperature source Oral, resp. rate 18, height  (1.651 m), weight 127 lb (57.607 kg), SpO2 99 %.Body mass index is 21.13 kg/(m^2).  General Appearance: Casual and Fairly Groomed  Patent attorney::  Fair  Speech:  Clear and Coherent  Volume:  Decreased  Mood:  Anxious and Depressed  Affect:  Congruent  Thought Process:  Circumstantial  Orientation:  Full (Time, Place, and Person)  Thought Content:  WDL  Suicidal Thoughts:  No  Homicidal Thoughts:  No  Memory:  Immediate;   Fair  Judgement:  Fair  Insight:  Shallow  Psychomotor Activity:  Psychomotor Retardation  Concentration:  Fair  Recall:  [employer] of Knowledge:Fair  Language: Fair  Akathisia:  No  Handed:  Right  AIMS (if indicated):     Assets:  Communication Skills Desire for Improvement Physical Health  ADL's:  Intact  Cognition: WNL  Sleep:  Number  of Hours: 5.25     Current Medications: Current Facility-Administered Medications  Medication Dose Route Frequency Provider Last Rate Last Dose  . acetaminophen (TYLENOL) tablet 650 mg  650 mg Oral Q6H PRN Audery Amel, MD      . alum & mag hydroxide-simeth (MAALOX/MYLANTA) 200-200-20 MG/5ML suspension 30 mL  30 mL Oral Q4H PRN Audery Amel, MD   30 mL at 09/01/15 0409  . chlordiazePOXIDE (LIBRIUM) capsule 50 mg  50 mg Oral QID [name], MD   50 mg at 09/02/15 1742  . citalopram (CELEXA) tablet 40 mg  40 mg Oral  Daily Audery Amel, MD   40 mg at 09/02/15 0946  . folic acid (FOLVITE) tablet 1 mg  1 mg Oral Daily Audery Amel, MD   1 mg at 09/02/15 0946  . LORazepam (ATIVAN) tablet 2 mg  2 mg Oral TID PRN [name], MD      . magnesium hydroxide (MILK OF MAGNESIA) suspension 30 mL  30 mL Oral Daily PRN Audery Amel, MD      . multivitamin with minerals tablet 1 tablet  1 tablet Oral Daily Audery Amel, MD   1 tablet at 09/02/15 0946  . thiamine (VITAMIN B-1) tablet 100 mg  100 mg Oral Daily Audery Amel, MD   100 mg at 09/02/15 0946  . traZODone (DESYREL) tablet 150 mg  150 mg Oral QHS Audery Amel, MD   150 mg at 09/01/15 2113    Lab Results:  Results for orders placed or performed during the hospital encounter of 08/31/15 (from the past 48 hour(s))  TSH     Status: None   Collection Time: 09/01/15  6:08 AM  Result Value Ref Range   TSH 3.893 0.350 - 4.500 uIU/mL    Physical Findings: AIMS: Facial and Oral Movements Muscles of Facial Expression: None, normal Lips and Perioral Area: None, normal Jaw: None, normal Tongue: None, normal,Extremity Movements Upper (arms, wrists, hands, fingers): None, normal Lower (legs, knees, ankles, toes): None, normal, Trunk Movements Neck, shoulders, hips: None, normal, Overall Severity Severity of abnormal movements (highest score from questions above): None, normal Incapacitation due to abnormal movements: None, normal Patient's awareness of abnormal movements (rate only patient's report): No Awareness, Dental Status Current problems with teeth and/or dentures?: No Does patient usually wear dentures?: No  CIWA:  CIWA-Ar Total: 2 COWS:     Treatment Plan Summary: Daily contact with patient to assess and evaluate symptoms and progress in treatment and Medication management   Medical Decision Making:    Daily contact with patient to assess and evaluate symptoms and progress in treatment and Medication management    Major  depressive disorder:  Continue Celexa 40 mg by mouth daily   Alcohol withdrawal: Patient has been started on a Librium taper, she has been started on Librium 50 mg 4 times a day. And I will decrease the dose to Librium 25 mg 4 times a day.   She will be offered Ativan 2 mg every 8 hours when necessary if CIWA >15.  Patient also receiving multivitamins and thiamine.  Insomnia: Patient has been started on trazodone 150 mg by mouth  Precautions continue every 15 minute checks along with withdrawal precautions  Discharge disposition: Once a stable patient will be discharged to the homeless shelter and will be set up to follow-up with an outpatient substance abuse program.        Brandy Hale 09/02/2015, 6:25 PM

## 2015-09-02 NOTE — Progress Notes (Signed)
Pt has been seclusive to her room.. Pt's mood and affect has been depressed. Pt denies SI and A/v hallucinations.Pt c/o being tired . Some mild wilthdrawl symptoms noted.  Pt is able to contract for safety. No withdawal s/s noted. Pt noted to be slightly irritable at times.Will continue to observe and maitain a safe environment.

## 2015-09-02 NOTE — [facility] Group Notes (Signed)
[facility] LCSW Group Therapy  09/02/2015 2:36 PM  Type of Therapy:  Group Therapy  Participation Level:  Did Not Attend  Modes of Intervention:  Discussion, Education, Socialization and Support  Summary of Progress/Problems:Pt will learn how to identify obstacles, self-sabotaging and enabling behaviors, what they are and why we do them. Pt will discuss unhealthy relationships and how to have positive healthy boundaries with those that sabotage and enable. Pt will identify aspects of self sabotage and enabling in themselves and how to limit these behaviors.    Candace L Hyatt MSW, LCSWA  09/02/2015, 2:36 PM 

## 2015-09-02 NOTE — Progress Notes (Signed)
This 39 y.o. Female spoke this p.m. About how she's feeling weak; trying to eat more; and that, "I have to go home to be with my husband; I have things to do; I need to go home; I'm not suicidal."

## 2015-09-03 NOTE — Progress Notes (Signed)
D: Patient denies SI/HI/AVH.  Patient affect and mood are anxious.  Patient is fixated on discharging.  She states she no longer wants to be here and is "ready to go." Patient did attend evening group. Patient visible on the milieu. No distress noted. A: Support and encouragement offered. Scheduled medications given to pt. Q 15 min checks continued for patient safety. R: Patient receptive. Patient remains safe on the unit.

## 2015-09-03 NOTE — [facility] Suicide Risk Assessment (Signed)
[facility] Discharge Suicide Risk Assessment   Demographic Factors:  Low socioeconomic status, Living alone and Unemployed  Total Time spent with patient: 30 minutes    Psychiatric Specialty Exam: Physical Exam  ROS   Has this patient used any form of tobacco in the last 30 days? (Cigarettes, Smokeless Tobacco, Cigars, and/or Pipes) No  Mental Status Per Nursing Assessment::   On Admission:     Current Mental Status by Physician: Denies SI, HI or auditory or visual hallucinations. The patient has been voicing over the weekend feeling ready for discharge and no longer needing to be here. There is no evidence of agitation, or anxiety.  Loss Factors: NA  Historical Factors: Prior suicide attempts  Risk Reduction Factors:   NA  Continued Clinical Symptoms:  Depression:   Comorbid alcohol abuse/dependence Impulsivity Alcohol/Substance Abuse/Dependencies Personality Disorders:   Cluster B Comorbid alcohol abuse/dependence Comorbid depression Previous Psychiatric Diagnoses and Treatments  Cognitive Features That Contribute To Risk:  None    Suicide Risk:  Minimal: No identifiable suicidal ideation.  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 Alcohol use disorder, severe, dependence [F10.20] 08/31/2015  . Alcohol withdrawal [F10.239] 08/31/2015  . Stimulant use disorder (cocaine) [F15.99] 08/31/2015  . 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

## 2015-09-03 NOTE — Discharge Summary (Signed)
Physician Discharge Summary Note  Patient:  Kara Lawrence is an 39 y.o., female MRN:  [id number] DOB:  09-26-1976 Patient phone:  [phone] (home)  Patient address:   [address] 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 Alcohol use disorder, severe, dependence [F10.20] 08/31/2015  . Alcohol withdrawal [F10.239] 08/31/2015  . Stimulant use disorder (cocaine) [F15.99] 08/31/2015  . 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   HENT: Negative.   Eyes: Negative.   Respiratory: Negative.   Cardiovascular: Negative.   Gastrointestinal: Negative.   Genitourinary: Negative.   Musculoskeletal: Negative.   Skin: Negative.   Neurological: Negative.   Endo/Heme/Allergies: Negative.   Psychiatric/Behavioral: Negative.     Blood pressure 87/62, pulse 93, temperature 98.4 F (36.9 C), temperature source Oral, resp. rate 18, height 5' 5"  (1.651 m), weight 57.607 kg (127 lb), SpO2 99 %.Body mass index is 21.13 kg/(m^2).  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 female who presented to the ED via EMS on 9/14 from convenience store for agitation, using profanity and stating to the clerk she wanted to "kill her self" because she had an argument with her boyfriend. Patient is known to the ED and has had several prior similar presentations. Arrived agitated, cursing but able to be verbally redirected.Denies intentional ingestion or self-harm. Does admit to continued thoughts of suicide without plan. Alcohol level at arrival was >400 and urine toxicology was positive for cocaine.  Per ER psychiatrist: "She evidently had had a fight with her boyfriend. Decided that she wanted to die. Mood is been feeling depressed and down. Sleep is poor. Appetite is okay. She is also under a lot of stress because she has no stable place to stay right now. She has relapsed back into drinking. She can't tell me how long she is been back drinking. She denies that she's using drugs but her drug screening is positive for cocaine. Denies any auditory or visual hallucinations. Denies that she's actually done anything to try to kill her self."  Today patient was uncooperative with assessment did not want to talk to nursing or myself. Most of the information in this initial assessment was obtained from her medical record.   Substance abuse history: History about call  abuse. Denies seizures no clear history of DTs. Minimizes drug use but has a long history of intermittent cocaine abuse. Doesn't engage in substance abuse treatment outside the hospital.  HPI Elements: Quality: Depressed mood with suicidal ideation. Severity: Severe and life-threatening. Timing: Worse today. Duration: Chronic  intermittent ongoing. Context: Breakup or fight with her boyfriend and substance abuse.   Total Time spent with patient: 1 hour   Past psychiatric history: Long psychiatric history of mood instability with multiple hospitalizations. No frank suicide attempts. Has been stabilized on Depakote and Celexa in the past. He is supposed to be following up with Dr. Randel Books. She claimed that she's taking her medicine but her Depakote level was 0 when she came in.   Past Medical History:  Past Medical History  Diagnosis Date  . Seizure   . Bipolar 1 disorder   . Manic depression     Past Surgical History  Procedure Laterality Date  . Tubal ligation Bilateral    Family History: History reviewed. No pertinent family history.   Social History: Had been living with her grandmother. Works at [employer]. Says that now she has no stable place to live. History  Alcohol Use  . Yes    Comment: pt smells strongly of ETOH    History  Drug Use No    Social History   Social History  . Marital Status: Single    Spouse Name: N/A  . Number of Children: N/A  . Years of Education: N/A   Social History Main Topics  . Smoking status: Never Smoker   . Smokeless tobacco: Never Used  . Alcohol Use: Yes     Comment: pt smells strongly of ETOH  . Drug Use: No  . Sexual Activity: Yes    Birth Control/ Protection: None   Other Topics Concern  . None   Social History Narrative           Hospital Course:  39 year old African-American female with history of mood instability alcohol dependence and cocaine dependence presented to our emergency department intoxicated with an alcohol level of all 400 and voicing suicidal ideation. Currently showing some evidence of withdrawal. Uncooperative with assessment.  Major depressive disorder: Continue Celexa 40 mg by mouth daily which is being prescribed by her  outpatient psychiatrist at [facility]. Patient was also prescribed with Depakote however due to heavy drinking this medication will be discontinued. There is no evidence the patient suffers from bipolar disorder to justify the use of this medication.  Alcohol withdrawal: Patient has been started on a Librium taper, she was started on Librium 50 mg 4 times a day. Vital signs were checked every 8 hours.  Patient also received multivitamins and thiamine.  Insomnia: Patient has been started on trazodone 150 mg by mouth  On the day of the discharge the patient denied SI, HI or having auditory or visual hallucinations. She was calm and more cooperative.  There was noted for seclusion, restraints or forced medications. During her stay in the hospital patient was uncooperative with staff. She had minimal participation in programming. Most of the time he was explaining her room sleeping.  When she was over the withdrawals she voiced desire to be discharged saying "I have nothing else to do here". The patient does not seem to have real motivation for recovery. Has very limited insight into the severity of her addiction.  Patient will be discharged with a follow-up to [facility].  Discharge Vitals:   Blood pressure 87/62,  pulse 93, temperature 98.4 F (36.9 C), temperature source Oral, resp. rate 18, height 5' 5"  (1.651 m), weight 57.607 kg (127 lb), SpO2 99 %. Body mass index is 21.13 kg/(m^2).  Lab Results:    Results for ANWYN, KRIEGEL (MRN [id number]) as of 09/03/2015 10:36  Ref. Range 08/30/2015 03:03 08/30/2015 13:06 08/31/2015 14:51 09/01/2015 06:08  Sodium Latest Ref Range: 135-145 mmol/L 142     Potassium Latest Ref Range: 3.5-5.1 mmol/L 3.4 (L)     Chloride Latest Ref Range: 101-111 mmol/L 108     CO2 Latest Ref Range: 22-32 mmol/L 25     BUN Latest Ref Range: 6-20 mg/dL <5 (L)     Creatinine Latest Ref Range: 0.44-1.00 mg/dL 0.68     Calcium Latest Ref Range: 8.9-10.3 mg/dL 8.9     EGFR (Non-African  Amer.) Latest Ref Range: >60 mL/min >60     EGFR (African American) Latest Ref Range: >60 mL/min >60     Glucose Latest Ref Range: 65-99 mg/dL 111 (H)     Anion gap Latest Ref Range: 5-15  9     Alkaline Phosphatase Latest Ref Range: 38-126 U/L 72     Albumin Latest Ref Range: 3.5-5.0 g/dL 3.8     AST Latest Ref Range: 15-41 U/L 144 (H)     ALT Latest Ref Range: 14-54 U/L 54     Total Protein Latest Ref Range: 6.5-8.1 g/dL 7.9     Total Bilirubin Latest Ref Range: 0.3-1.2 mg/dL 0.4     WBC Latest Ref Range: 3.6-11.0 K/uL 4.6     RBC Latest Ref Range: 3.80-5.20 MIL/uL 4.43     Hemoglobin Latest Ref Range: 12.0-16.0 g/dL 9.0 (L)     HCT Latest Ref Range: 35.0-47.0 % 28.4 (L)     MCV Latest Ref Range: 80.0-100.0 fL 64.2 (L)     MCH Latest Ref Range: 26.0-34.0 pg 20.3 (L)     MCHC Latest Ref Range: 32.0-36.0 g/dL 31.6 (L)     RDW Latest Ref Range: 11.5-14.5 % 19.4 (H)     Platelets Latest Ref Range: 778-242 K/uL 353     Salicylate Lvl Latest Ref Range: 2.8-30.0 mg/dL <4.0     Valproic Acid Lvl Latest Ref Range: 50.0-100.0 ug/mL <10 (L)     Acetaminophen Latest Ref Range: 10-30 ug/mL <10 (L)     TSH Latest Ref Range: 0.350-4.500 uIU/mL    3.893  Amorphous Crystal Unknown   PRESENT   Appearance Latest Ref Range: CLEAR    HAZY (A)   Bacteria, UA Latest Ref Range: NONE SEEN    NONE SEEN   Bilirubin Urine Latest Ref Range: NEGATIVE    NEGATIVE   Color, Urine Latest Ref Range: YELLOW    YELLOW (A)   Glucose Latest Ref Range: NEGATIVE mg/dL   NEGATIVE   Hgb urine dipstick Latest Ref Range: NEGATIVE    NEGATIVE   Ketones, ur Latest Ref Range: NEGATIVE mg/dL   1+ (A)   Leukocytes, UA Latest Ref Range: NEGATIVE    NEGATIVE   Mucous Unknown   PRESENT   Nitrite Latest Ref Range: NEGATIVE    NEGATIVE   pH Latest Ref Range: 5.0-8.0    7.0   Protein Latest Ref Range: NEGATIVE mg/dL   NEGATIVE   RBC / HPF Latest Ref Range: 0-5 RBC/hpf   NONE SEEN   Specific Gravity, Urine Latest Ref Range:  1.005-1.030    1.020   Squamous Epithelial / LPF Latest Ref Range: NONE SEEN  0-5 (A)   WBC, UA Latest Ref Range: 0-5 WBC/hpf   0-5   Alcohol, Ethyl (B) Latest Ref Range: <5 mg/dL 442 (HH) 145 (H)    Amphetamines, Ur Screen Latest Ref Range: NONE DETECTED  NONE DETECTED     Barbiturates, Ur Screen Latest Ref Range: NONE DETECTED  NONE DETECTED     Benzodiazepine, Ur Scrn Latest Ref Range: NONE DETECTED  NONE DETECTED     Cocaine Metabolite,Ur [place] Latest Ref Range: NONE DETECTED  POSITIVE (A)     Methadone Scn, Ur Latest Ref Range: NONE DETECTED  NONE DETECTED     MDMA (Ecstasy)Ur Screen Latest Ref Range: NONE DETECTED  NONE DETECTED     Cannabinoid 50 Ng, Ur [place] Latest Ref Range: NONE DETECTED  NONE DETECTED     Opiate, Ur Screen Latest Ref Range: NONE DETECTED  NONE DETECTED     Phencyclidine (PCP) Ur S Latest Ref Range: NONE DETECTED  NONE DETECTED     Tricyclic, Ur Screen Latest Ref Range: NONE DETECTED  NONE DETECTED       Physical Findings: AIMS: Facial and Oral Movements Muscles of Facial Expression: None, normal Lips and Perioral Area: None, normal Jaw: None, normal Tongue: None, normal,Extremity Movements Upper (arms, wrists, hands, fingers): None, normal Lower (legs, knees, ankles, toes): None, normal, Trunk Movements Neck, shoulders, hips: None, normal, Overall Severity Severity of abnormal movements (highest score from questions above): None, normal Incapacitation due to abnormal movements: None, normal Patient's awareness of abnormal movements (rate only patient's report): No Awareness, Dental Status Current problems with teeth and/or dentures?: No Does patient usually wear dentures?: No  CIWA:  CIWA-Ar Total: 2 COWS:          Medication List    STOP taking these medications        divalproex 250 MG 24 hr tablet  Commonly known as:  DEPAKOTE ER      TAKE these medications      Indication   citalopram 40 MG tablet  Commonly known as:  CELEXA  Take 40 mg by  mouth daily.  Notes to Patient:  depression      traZODone 150 MG tablet  Commonly known as:  DESYREL  Take by mouth at bedtime.  Notes to Patient:  insomnia   Indication:  Major Depressive Disorder       Follow-up Information    Go to [facility].   Why:  Hospital Follow up, Outpatient Medication Management, Therapy, You may call and schedule your appointment or walk in for a hospital follow up any day this week.   Contact information:   [place] [address] Phone:[phone] Fax:[phone]    time 30 minutes  Signed: Hildred Priest 09/03/2015, 12:28 PM

## 2015-09-03 NOTE — Progress Notes (Signed)
  [facility] Adult Case Management Discharge Plan :  Will you be returning to the same living situation after discharge:  Yes,    At discharge, do you have transportation home?: Yes,   called a friend to pick her up Do you have the ability to pay for your medications: Yes,     Release of information consent forms completed and in the chart;  Patient's signature needed at discharge.  Patient to Follow up at: Follow-up Information    Go to [facility].   Why:  Hospital Follow up, Outpatient Medication Management, Therapy, You may call and schedule your appointment or walk in for a hospital follow up any day this week.   Contact information:   [address] [place] [address] [phone] Fax:[phone]      Patient denies SI/HI: Yes,       Safety Planning and Suicide Prevention discussed: Yes,        Has patient been referred to the Quitline?: N/A patient is not a smoker    Laws, Cleda Daub, MSW, LCSW 09/03/2015, 3:01 PM

## 2015-09-03 NOTE — [facility] Suicide Risk Assessment (Signed)
[facility] INPATIENT:  Family/Significant Other Suicide Prevention Education  Suicide Prevention Education:  Contact Attempts: attempted to call PT's husband (name of family member/significant other) has been identified by the patient as the family member/significant other with whom the patient will be residing, and identified as the person(s) who will aid the patient in the event of a mental health crisis.  With written consent from the patient, two attempts were made to provide suicide prevention education, prior to and/or following the patient's discharge.  We were unsuccessful in providing suicide prevention education.  A suicide education pamphlet was given to the patient to share with family/significant other.  Date and time of first attempt: 09/03/2015-10:30 and time of second attempt:09/03/2015-11:30  Glennon Mac, MSW, LCSW 09/03/2015, 2:59 PM

## 2015-09-03 NOTE — Progress Notes (Signed)
Patient looks tense and disheveled.  Denies depression or SI.  Verbalizes that she is ready to go home and going to take one day at a time to stop drinking. Discharge instructions given, verbalized understanding. Informed patient that she did not have any new prescriptions. Personal belongings returned.  Escorted off unit by this Clinical research associate to meet friend to travel home.

## 2015-09-12 ENCOUNTER — Emergency Department
Admission: EM | Admit: 2015-09-12 | Discharge: 2015-09-12 | Disposition: A | Discharge status: Other Acute Inpatient Hospital | Payer: Self-pay | Attending: Emergency Medicine | Admitting: Emergency Medicine

## 2015-09-12 ENCOUNTER — Inpatient Hospital Stay
Admission: EM | Admit: 2015-09-12 | Discharge: 2015-09-14 | DRG: 885 | Disposition: A | Discharge status: Home / Self Care | Payer: No Typology Code available for payment source | Source: Intra-hospital | Attending: Psychiatry | Admitting: Psychiatry

## 2015-09-12 ENCOUNTER — Encounter: Payer: Self-pay | Admitting: Emergency Medicine

## 2015-09-12 DIAGNOSIS — F10939 Alcohol use, unspecified with withdrawal, unspecified: Secondary | ICD-10-CM | POA: Diagnosis present

## 2015-09-12 DIAGNOSIS — F1020 Alcohol dependence, uncomplicated: Secondary | ICD-10-CM | POA: Diagnosis present

## 2015-09-12 DIAGNOSIS — F1410 Cocaine abuse, uncomplicated: Secondary | ICD-10-CM

## 2015-09-12 DIAGNOSIS — F332 Major depressive disorder, recurrent severe without psychotic features: Secondary | ICD-10-CM

## 2015-09-12 DIAGNOSIS — Z79899 Other long term (current) drug therapy: Secondary | ICD-10-CM | POA: Diagnosis not present

## 2015-09-12 DIAGNOSIS — F1010 Alcohol abuse, uncomplicated: Secondary | ICD-10-CM

## 2015-09-12 DIAGNOSIS — Z9851 Tubal ligation status: Secondary | ICD-10-CM

## 2015-09-12 DIAGNOSIS — R45850 Homicidal ideations: Secondary | ICD-10-CM

## 2015-09-12 DIAGNOSIS — R45851 Suicidal ideations: Secondary | ICD-10-CM

## 2015-09-12 DIAGNOSIS — Z590 Homelessness: Secondary | ICD-10-CM

## 2015-09-12 DIAGNOSIS — R443 Hallucinations, unspecified: Secondary | ICD-10-CM

## 2015-09-12 DIAGNOSIS — G4700 Insomnia, unspecified: Secondary | ICD-10-CM | POA: Diagnosis present

## 2015-09-12 DIAGNOSIS — F1590 Other stimulant use, unspecified, uncomplicated: Secondary | ICD-10-CM | POA: Diagnosis present

## 2015-09-12 DIAGNOSIS — F10120 Alcohol abuse with intoxication, uncomplicated: Secondary | ICD-10-CM | POA: Insufficient documentation

## 2015-09-12 DIAGNOSIS — F10239 Alcohol dependence with withdrawal, unspecified: Secondary | ICD-10-CM | POA: Diagnosis present

## 2015-09-12 DIAGNOSIS — F14120 Cocaine abuse with intoxication, uncomplicated: Secondary | ICD-10-CM | POA: Insufficient documentation

## 2015-09-12 LAB — COMPREHENSIVE METABOLIC PANEL
ALBUMIN: 4.1 g/dL (ref 3.5–5.0)
ALK PHOS: 72 U/L (ref 38–126)
ALT: 33 U/L (ref 14–54)
AST: 47 U/L — AB (ref 15–41)
Anion gap: 10 (ref 5–15)
BILIRUBIN TOTAL: 0.3 mg/dL (ref 0.3–1.2)
CALCIUM: 9.2 mg/dL (ref 8.9–10.3)
CO2: 24 mmol/L (ref 22–32)
CREATININE: 0.69 mg/dL (ref 0.44–1.00)
Chloride: 107 mmol/L (ref 101–111)
GFR calc Af Amer: >60 mL/min (ref >60)
GFR calc non Af Amer: >60 mL/min (ref >60)
GLUCOSE: 81 mg/dL (ref 65–99)
Potassium: 4.1 mmol/L (ref 3.5–5.1)
Sodium: 141 mmol/L (ref 135–145)
TOTAL PROTEIN: 8.6 g/dL — AB (ref 6.5–8.1)

## 2015-09-12 LAB — CBC WITH DIFFERENTIAL/PLATELET
BASOS ABS: 0.1 10*3/uL (ref 0–0.1)
BASOS PCT: 2 %
EOS ABS: 0.1 10*3/uL (ref 0–0.7)
Eosinophils Relative: 1 %
HEMATOCRIT: 31.1 % — AB (ref 35.0–47.0)
HEMOGLOBIN: 10.1 g/dL — AB (ref 12.0–16.0)
LYMPHS PCT: 55 %
Lymphs Abs: 3.4 10*3/uL (ref 1.0–3.6)
MCH: 20.9 pg — ABNORMAL LOW (ref 26.0–34.0)
MCHC: 32.5 g/dL (ref 32.0–36.0)
MCV: 64.2 fL — ABNORMAL LOW (ref 80.0–100.0)
Monocytes Absolute: 0.6 10*3/uL (ref 0.2–0.9)
Monocytes Relative: 9 %
NEUTROS PCT: 33 %
Neutro Abs: 2 10*3/uL (ref 1.4–6.5)
Platelets: 592 10*3/uL — ABNORMAL HIGH (ref 150–440)
RBC: 4.84 MIL/uL (ref 3.80–5.20)
RDW: 20.3 % — ABNORMAL HIGH (ref 11.5–14.5)
WBC: 6.2 10*3/uL (ref 3.6–11.0)

## 2015-09-12 LAB — SALICYLATE LEVEL: Salicylate Lvl: <4 mg/dL (ref 2.8–30.0)

## 2015-09-12 LAB — URINALYSIS COMPLETE WITH MICROSCOPIC (ARMC ONLY)
Bacteria, UA: NONE SEEN
Bilirubin Urine: NEGATIVE
Glucose, UA: NEGATIVE mg/dL
HGB URINE DIPSTICK: NEGATIVE
Ketones, ur: NEGATIVE mg/dL
LEUKOCYTES UA: NEGATIVE
Nitrite: NEGATIVE
PH: 5 (ref 5.0–8.0)
PROTEIN: NEGATIVE mg/dL
Specific Gravity, Urine: 1.005 (ref 1.005–1.030)

## 2015-09-12 LAB — URINE DRUG SCREEN, QUALITATIVE (ARMC ONLY)
AMPHETAMINES, UR SCREEN: NOT DETECTED
BARBITURATES, UR SCREEN: NOT DETECTED
BENZODIAZEPINE, UR SCRN: POSITIVE — AB
Cannabinoid 50 Ng, Ur ~~LOC~~: NOT DETECTED
Cocaine Metabolite,Ur ~~LOC~~: POSITIVE — AB
MDMA (Ecstasy)Ur Screen: NOT DETECTED
METHADONE SCREEN, URINE: NOT DETECTED
Opiate, Ur Screen: NOT DETECTED
PHENCYCLIDINE (PCP) UR S: NOT DETECTED
Tricyclic, Ur Screen: NOT DETECTED

## 2015-09-12 LAB — VALPROIC ACID LEVEL

## 2015-09-12 LAB — ACETAMINOPHEN LEVEL: Acetaminophen (Tylenol), Serum: <10 ug/mL — ABNORMAL LOW (ref 10–30)

## 2015-09-12 LAB — TSH: TSH: 3.358 u[IU]/mL (ref 0.350–4.500)

## 2015-09-12 LAB — ETHANOL: Alcohol, Ethyl (B): 314 mg/dL (ref <5)

## 2015-09-12 MED ORDER — LORAZEPAM 1 MG PO TABS
1.0000 mg | ORAL_TABLET | Freq: Four times a day (QID) | ORAL | Status: DC | PRN
  Administered 2015-09-12: 1 mg via ORAL
  Filled 2015-09-12: qty 1

## 2015-09-12 MED ORDER — TRAZODONE HCL 100 MG PO TABS: 100.0000 mg | ORAL_TABLET | Freq: Every day | ORAL | Status: DC

## 2015-09-12 MED ORDER — LORAZEPAM 1 MG PO TABS: 1.0000 mg | ORAL_TABLET | Freq: Four times a day (QID) | ORAL | Status: DC | PRN

## 2015-09-12 MED ORDER — FOLIC ACID 1 MG PO TABS
1.0000 mg | ORAL_TABLET | Freq: Every day | ORAL | Status: DC
  Administered 2015-09-12: 1 mg via ORAL
  Filled 2015-09-12: qty 1

## 2015-09-12 MED ORDER — TRAZODONE HCL 100 MG PO TABS
100.0000 mg | ORAL_TABLET | Freq: Every day | ORAL | Status: DC
  Administered 2015-09-12: 100 mg via ORAL
  Filled 2015-09-12 (×2): qty 1

## 2015-09-12 MED ORDER — CITALOPRAM HYDROBROMIDE 20 MG PO TABS
20.0000 mg | ORAL_TABLET | Freq: Every day | ORAL | Status: DC
  Administered 2015-09-13 – 2015-09-14 (×2): 20 mg via ORAL
  Filled 2015-09-12 (×2): qty 1

## 2015-09-12 MED ORDER — LORAZEPAM 2 MG/ML IJ SOLN
INTRAMUSCULAR | Status: AC
  Administered 2015-09-12: 2 mg via INTRAMUSCULAR
  Filled 2015-09-12: qty 1

## 2015-09-12 MED ORDER — LORAZEPAM 2 MG/ML IJ SOLN: 1.0000 mg | Freq: Four times a day (QID) | INTRAMUSCULAR | Status: DC | PRN

## 2015-09-12 MED ORDER — ALUM & MAG HYDROXIDE-SIMETH 200-200-20 MG/5ML PO SUSP: 30.0000 mL | ORAL | Status: DC | PRN

## 2015-09-12 MED ORDER — MAGNESIUM HYDROXIDE 400 MG/5ML PO SUSP: 30.0000 mL | Freq: Every day | ORAL | Status: DC | PRN

## 2015-09-12 MED ORDER — CITALOPRAM HYDROBROMIDE 20 MG PO TABS
20.0000 mg | ORAL_TABLET | Freq: Every day | ORAL | Status: DC
  Administered 2015-09-12: 20 mg via ORAL
  Filled 2015-09-12: qty 1

## 2015-09-12 MED ORDER — FOLIC ACID 1 MG PO TABS
1.0000 mg | ORAL_TABLET | Freq: Every day | ORAL | Status: DC
  Administered 2015-09-13 – 2015-09-14 (×2): 1 mg via ORAL
  Filled 2015-09-12 (×2): qty 1

## 2015-09-12 MED ORDER — ADULT MULTIVITAMIN W/MINERALS CH
1.0000 | ORAL_TABLET | Freq: Every day | ORAL | Status: DC
  Administered 2015-09-12: 1 via ORAL
  Filled 2015-09-12: qty 1

## 2015-09-12 MED ORDER — LORAZEPAM 2 MG/ML IJ SOLN
2.0000 mg | Freq: Once | INTRAMUSCULAR | Status: AC
  Administered 2015-09-12: 2 mg via INTRAMUSCULAR

## 2015-09-12 MED ORDER — THIAMINE HCL 100 MG/ML IJ SOLN
100.0000 mg | Freq: Every day | INTRAMUSCULAR | Status: DC
  Filled 2015-09-12: qty 2

## 2015-09-12 MED ORDER — ADULT MULTIVITAMIN W/MINERALS CH
1.0000 | ORAL_TABLET | Freq: Every day | ORAL | Status: DC
  Administered 2015-09-13 – 2015-09-14 (×2): 1 via ORAL
  Filled 2015-09-12 (×2): qty 1

## 2015-09-12 MED ORDER — VITAMIN B-1 100 MG PO TABS
100.0000 mg | ORAL_TABLET | Freq: Every day | ORAL | Status: DC
  Administered 2015-09-13 – 2015-09-14 (×2): 100 mg via ORAL
  Filled 2015-09-12 (×2): qty 1

## 2015-09-12 MED ORDER — VITAMIN B-1 100 MG PO TABS
100.0000 mg | ORAL_TABLET | Freq: Every day | ORAL | Status: DC
  Administered 2015-09-12: 100 mg via ORAL
  Filled 2015-09-12: qty 1

## 2015-09-12 MED ORDER — LORAZEPAM 2 MG PO TABS: 2.0000 mg | ORAL_TABLET | Freq: Once | ORAL | Status: DC

## 2015-09-12 MED ORDER — THIAMINE HCL 100 MG/ML IJ SOLN: 100.0000 mg | Freq: Every day | INTRAMUSCULAR | Status: DC

## 2015-09-12 MED ORDER — ACETAMINOPHEN 325 MG PO TABS: 650.0000 mg | ORAL_TABLET | Freq: Four times a day (QID) | ORAL | Status: DC | PRN

## 2015-09-12 NOTE — ED Notes (Signed)
BEHAVIORAL HEALTH ROUNDING Patient sleeping: Yes.   Patient alert and oriented: Unable to assess. Patient is sleeping. Behavior appropriate: Yes.  ; If no, describe: Nutrition and fluids offered: Yes  and Patient fell asleep while eating. Toileting and hygiene offered: Yes  Sitter present: Behavioral tech rounding every 15 minutes for safety. Law enforcement present: Yes  and Old Designer, television/film set.

## 2015-09-12 NOTE — ED Notes (Signed)
BEHAVIORAL HEALTH ROUNDING Patient sleeping: Yes.   Patient alert and oriented: not applicable Behavior appropriate: Yes.  ; If no, describe:  Nutrition and fluids offered: No Toileting and hygiene offered: No Sitter present: no Law enforcement present: Yes  

## 2015-09-12 NOTE — ED Notes (Signed)
BEHAVIORAL HEALTH ROUNDING Patient sleeping: No. Patient alert and oriented: yes Behavior appropriate: No.; If no, describe: belligerent/cursing/threatening staff Nutrition and fluids offered: Yes  Toileting and hygiene offered: Yes  Sitter present: yes Law enforcement present: Yes

## 2015-09-12 NOTE — Plan of Care (Signed)
Problem: Alteration in mood Goal: STG-Patient reports thoughts of self-harm to staff Outcome: Not Met (add Reason) Pt remains agitated and irritable. No voiced thoughts of hurting herself. No injuries noted. q 15 min checks maintained for safety.

## 2015-09-12 NOTE — ED Notes (Signed)
BEHAVIORAL HEALTH ROUNDING Patient sleeping: No. Patient alert and oriented: yes Behavior appropriate: No.; If no, describe: Patient is irritable, states she wants to go home. Patient refused for her vital signs to be taken by rover. Nutrition and fluids offered: Yes  Toileting and hygiene offered: Yes  Sitter present: Behavioral tech rounding every 15 minutes for safety. Law enforcement present: Yes  and Old Designer, television/film set.

## 2015-09-12 NOTE — ED Provider Notes (Signed)
[facility] Emergency Department Provider Note  ____________________________________________  Time seen: Approximately 8:00 AM  Seen upon arrival to the emergency department   HISTORY  Chief Complaint Mental Health Problem    HPI Kara Lawrence is a 39 y.o. female with a history of bipolar and seizure disorder who is presenting today with suicidal and homicidal ideation. She says that over the past day she has started to her voices that tell her to kill herself and her husband. She has a plan to kill herself like slitting her wrists with a razor knife. She does not state a plan to kill her husband. She says that she has not been taking her medications over the past 2 weeks since being discharged from this emergency departmentfor alcohol intoxication and depression. She denies any toxic ingestion to kill her self prior to arrival today. She denies any pain at this time.   Past Medical History  Diagnosis Date  . Seizure   . Bipolar 1 disorder   . Manic depression     Patient Active Problem List   Diagnosis Date Noted  . Alcohol use disorder, severe, dependence 08/31/2015  . Alcohol withdrawal 08/31/2015  . Stimulant use disorder (cocaine) 08/31/2015  . 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 Tubal ligation Bilateral     Current Outpatient Rx  Name  Route  Sig  Dispense  Refill  . divalproex (DEPAKOTE SPRINKLE) 125 MG capsule   Oral   Take 125 mg by mouth 2 (two) times daily.         . citalopram (CELEXA) 40 MG tablet   Oral   Take 40 mg by mouth daily.         . traZODone (DESYREL) 150 MG tablet   Oral   Take by mouth at bedtime.           Allergies Review of patient's allergies indicates no known allergies.  History reviewed. No pertinent family history.  Social History Social History  Substance Use Topics  . Smoking status: Never  Smoker   . Smokeless tobacco: Never Used  . Alcohol Use: Yes     Comment: pt smells strongly of ETOH    Review of Systems Constitutional: No fever/chills Eyes: No visual changes. ENT: No sore throat. Cardiovascular: Denies chest pain. Respiratory: Denies shortness of breath. Gastrointestinal: No abdominal pain.  No nausea, no vomiting.  No diarrhea.  No constipation. Genitourinary: Negative for dysuria. Musculoskeletal: Negative for back pain. Skin: Negative for rash. Neurological: Negative for headaches, focal weakness or numbness.  10-point ROS otherwise negative.  ____________________________________________   PHYSICAL EXAM:  VITAL SIGNS: ED Triage Vitals  Enc Vitals Group     BP --      Pulse --      Resp --      Temp --      Temp src --      SpO2 --      Weight --      Height --      Head Cir --      Peak Flow --      Pain Score 09/12/15 0729 0     Pain Loc --      Pain Edu? --      Excl. in GC? --     Constitutional: Alert and oriented. Mildly disheveled and tearful.  Eyes: Conjunctivae are normal. PERRL. EOMI. Head: Atraumatic. Nose: No congestion/rhinnorhea.  Mouth/Throat: Mucous membranes are moist.  Oropharynx non-erythematous. Neck: No stridor.   Cardiovascular: Normal rate, regular rhythm. Grossly normal heart sounds.  Good peripheral circulation. Respiratory: Normal respiratory effort.  No retractions. Lungs CTAB. Gastrointestinal: Soft and nontender. No distention. No abdominal bruits. No CVA tenderness. Musculoskeletal: No lower extremity tenderness nor edema.  No joint effusions. Neurologic:  Normal speech and language. No gross focal neurologic deficits are appreciated. No gait instability. Skin:  Skin is warm, dry and intact. No rash noted. Psychiatric: Depressed mood. Slightly tangential. Not overtly responding to internal stimuli. Positive for suicidal and homicidal ideation.   ____________________________________________   LABS (all  labs ordered are listed, but only abnormal results are displayed)  Labs Reviewed  CBC WITH DIFFERENTIAL/PLATELET - Abnormal; Notable for the following:    Hemoglobin 10.1 (*)    HCT 31.1 (*)    MCV 64.2 (*)    MCH 20.9 (*)    RDW 20.3 (*)    Platelets 592 (*)    All other components within normal limits  COMPREHENSIVE METABOLIC PANEL - Abnormal; Notable for the following:    BUN <5 (*)    Total Protein 8.6 (*)    AST 47 (*)    All other components within normal limits  ETHANOL - Abnormal; Notable for the following:    Alcohol, Ethyl (B) 314 (*)    All other components within normal limits  ACETAMINOPHEN LEVEL - Abnormal; Notable for the following:    Acetaminophen (Tylenol), Serum <10 (*)    All other components within normal limits  URINALYSIS COMPLETEWITH MICROSCOPIC ([facility] ONLY) - Abnormal; Notable for the following:    Color, Urine STRAW (*)    APPearance CLEAR (*)    Squamous Epithelial / LPF 0-5 (*)    All other components within normal limits  URINE DRUG SCREEN, QUALITATIVE ([facility] ONLY) - Abnormal; Notable for the following:    Cocaine Metabolite,Ur [place] POSITIVE (*)    Benzodiazepine, Ur Scrn POSITIVE (*)    All other components within normal limits  VALPROIC ACID LEVEL - Abnormal; Notable for the following:    Valproic Acid Lvl <10 (*)    All other components within normal limits  SALICYLATE LEVEL   ____________________________________________  EKG   ____________________________________________  RADIOLOGY   ____________________________________________    ____________________________________________   INITIAL IMPRESSION / ASSESSMENT AND PLAN / ED COURSE  Pertinent labs & imaging results that were available during my care of the patient were reviewed by me and considered in my medical decision making (see chart for details).  Patient aware that she will be placed under involuntary commitment. I completed the IVC paperwork. At this time we are waiting for  lab results as well as psychiatry consult.  ----------------------------------------- 9:11 AM on 09/12/2015 -----------------------------------------  Patient becoming agitated and switched into room 23. Cursing at the staff. Given 2 mg of Ativan IM. Offered pills but refusing. Patient did not need to be restrained and was amenable to IM injection of Ativan. Positive for cocaine in her UDS. This could explain some of her agitated behavior.  ----------------------------------------- 4:03 PM on 09/12/2015 -----------------------------------------  Patient found to be both positive for alcohol intoxication as well as cocaine. Pending psychiatry consult at this time. ____________________________________________   FINAL CLINICAL IMPRESSION(S) / ED DIAGNOSES  Acute alcohol and cocaine intoxication. Acute suicidal and homicidal ideations. Acute hallucinations.    Myrna Blazer, MD 09/12/15 [phone]

## 2015-09-12 NOTE — ED Notes (Signed)
BEHAVIORAL HEALTH ROUNDING Patient sleeping: Yes.   Patient alert and oriented: not applicable Behavior appropriate: Yes.  ; If no, describe:  Nutrition and fluids offered: Yes  and Patient declined to eat at this time. Meal remains in the room. Toileting and hygiene offered: Yes  Sitter present: Behavioral tech rounding every 15 minutes for safety. Law enforcement present: Yes  and Old [employer]er, television/film set.

## 2015-09-12 NOTE — ED Notes (Signed)
Patient is sitting up in bed eating dinner. Patient states she likes [facility] better.

## 2015-09-12 NOTE — Progress Notes (Signed)
Patient is a 39 year old female admitted to unit with depression and SI/HI, plan to cut her wrist per documentation. Pt is irritable and agitated, states "I'm not answering anymore questions" says she wants to go to bed. Pt states she was recently discharged from the hospital last week  and stopped taking her medications because she started feeling better. Pt is cooperative but guarded. Poor eye contact. Steady gait. Dx bipolar, MDD and Alcohol D/O.  Ativan 1 mg given at 2217 PRN for agitation. Pt resting quietly in bed. Denies SI/HI/AV/H. No s/s of W/D noted.  No c/o pain/discomfort noted. Pt searched no contraband found.

## 2015-09-12 NOTE — ED Notes (Signed)
BEHAVIORAL HEALTH ROUNDING Patient sleeping: Yes.   Patient alert and oriented: not applicable Behavior appropriate: Yes.  ; If no, describe:  Nutrition and fluids offered: No Toileting and hygiene offered: Yes  Sitter present: Behavioral tech rounding every 15 minutes for safety Law enforcement present: Yes

## 2015-09-12 NOTE — ED Notes (Signed)

## 2015-09-12 NOTE — ED Notes (Signed)
Patient declined meal at this time. Meal left in the room. Patient is cooperative, resting on the stretcher.

## 2015-09-12 NOTE — Tx Team (Signed)
Initial Interdisciplinary Treatment Plan   PATIENT STRESSORS: Financial difficulties Substance abuse   PATIENT STRENGTHS: Ability for insight Average or above average intelligence   PROBLEM LIST: Problem List/Patient Goals Date to be addressed Date deferred Reason deferred Estimated date of resolution  depression 09/12/2015     Substance abuse 09/12/2015                                                DISCHARGE CRITERIA:  Improved stabilization in mood, thinking, and/or behavior Verbal commitment to aftercare and medication compliance  PRELIMINARY DISCHARGE PLAN: Attend aftercare/continuing care group  PATIENT/FAMIILY INVOLVEMENT: This treatment plan has been presented to and reviewed with the patient, Kara Lawrence, and/or family member.  The patient and family have been given the opportunity to ask questions and make suggestions.  Foster Simpson

## 2015-09-12 NOTE — ED Notes (Signed)
Pt via ems states she is here "because I am suicidal." Pt declines to give amount of time she has been feeling this way. Pt is belligerent and cursing alternately with crying and moaning and is unable to assist with triage/assessment.

## 2015-09-12 NOTE — ED Notes (Signed)
Patient is sleeping at this time. NAD. 

## 2015-09-12 NOTE — ED Notes (Signed)
Patient is alert, resting on stretcher. NAD

## 2015-09-12 NOTE — BH Assessment (Signed)
BHH assessment attempted however, Pt is sleeping and unable to arouse. Per Pt RN Corrie Dandy) pt was administered ativan. Will re-attempt at later time.

## 2015-09-12 NOTE — ED Notes (Signed)
BEHAVIORAL HEALTH ROUNDING Patient sleeping: Yes.   Patient alert and oriented: yes Behavior appropriate: Yes.  ; If no, describe: Patient is alert and oriented and irritable, but cooperative when awake. Nutrition and fluids offered: No Toileting and hygiene offered: Yes  Sitter present: Behavioral tech rounding every 15 minutes for safety. Law enforcement present: Yes  and [employer], television/film set.

## 2015-09-12 NOTE — BHH Counselor (Addendum)
Per Pysch MD (Dr.Clapacs) psych consult has been completed, is to be admitted to [facility] and does not need to be seen by TTS.

## 2015-09-12 NOTE — Consult Note (Signed)
[facility] Consult   Reason for Consult:  Consult for this 39 year old woman with a history of alcohol abuse and severe depression and comes into the hospital having thoughts of suicide Archie Balboa Referring Physician:  Archie Balboa Patient Identification: Kara Lawrence MRN:  [id number] Principal Diagnosis: Severe recurrent major depression without psychotic features Diagnosis:   Patient Active Problem List   Diagnosis Date Noted  . Severe recurrent major depression without psychotic features [F33.2] 09/12/2015  . Alcohol use disorder, severe, dependence [F10.20] 08/31/2015  . Alcohol withdrawal [F10.239] 08/31/2015  . Stimulant use disorder (cocaine) [F15.99] 08/31/2015  . 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 female patient admitted with "I'm not doing well, I'm having thoughts of hurting myself".  HPI:  Information from the patient and the chart. Patient called 911 to have her self brought to the hospital. She says her mood is been very depressed. Sad and down consistently for over a week. Feels hopeless and worthless. Has not been sleeping well only a couple hours a night. Not eating normally. She has been drinking heavily pretty much every day since she left here last. She also has been smoking crack several times a week. She has not been compliant with any psychiatric medicine and has not gone for any psychiatric treatment. She claims to me that she has had seizures today related to her alcohol although it's unclear if what she is describing a really seizures.  Past psychiatric history: Long history of alcohol abuse with depression along with that. History of suicide attempts in the past. History of nonresponse to and noncompliance with treatment. Was recently discharged from our hospital and was on Celexa and trazodone at that time. Has been  noncompliant. Does have a history of visual and auditory hallucinations especially during withdrawal.  Social history: Currently has no place to stay. Has simply been sleeping with whoever she can find him a letter sleep there. Not working. Not staying in touch with family are going for treatment.  Medical history: History of reported seizure disorder related to alcohol use. She denies having any other medical problems that she knows of.  Family history: Mother had alcohol dependence as well.  Current medications: She is noncompliant with everything  Substance abuse history: Years of alcohol and cocaine dependence. Brief periods of sobriety.  Past Psychiatric History: Past history of suicide attempts and depression and multiple hospitalizations for detox. Has been treated with Celexa and trazodone most recently.  Risk to Self: Is patient at risk for suicide?: Yes Risk to Others:   Prior Inpatient Therapy:   Prior Outpatient Therapy:    Past Medical History:  Past Medical History  Diagnosis Date  . Seizure   . Bipolar 1 disorder   . Manic depression     Past Surgical History  Procedure Laterality Date  . Tubal ligation Bilateral    Family History: History reviewed. No pertinent family history. Family Psychiatric  History: Family history of alcohol abuse in her mother Social History:  History  Alcohol Use  . Yes    Comment: pt smells strongly of ETOH     History  Drug Use No    Social History   Social History  . Marital Status: Single    Spouse Name: N/A  . Number of Children: N/A  . Years of Education: N/A   Social History Main Topics  .  Smoking status: Never Smoker   . Smokeless tobacco: Never Used  . Alcohol Use: Yes     Comment: pt smells strongly of ETOH  . Drug Use: No  . Sexual Activity: Yes    Birth Control/ Protection: None   Other Topics Concern  . None   Social History Narrative   Additional Social History:                           Allergies:  No Known Allergies  Labs:  Results for orders placed or performed during the hospital encounter of 09/12/15 (from the past 48 hour(s))  CBC with Differential     Status: Abnormal   Collection Time: 09/12/15  7:51 AM  Result Value Ref Range   WBC 6.2 3.6 - 11.0 K/uL   RBC 4.84 3.80 - 5.20 MIL/uL   Hemoglobin 10.1 (L) 12.0 - 16.0 g/dL   HCT 31.1 (L) 35.0 - 47.0 %   MCV 64.2 (L) 80.0 - 100.0 fL   MCH 20.9 (L) 26.0 - 34.0 pg   MCHC 32.5 32.0 - 36.0 g/dL   RDW 20.3 (H) 11.5 - 14.5 %   Platelets 592 (H) 150 - 440 K/uL   Neutrophils Relative % 33 %   Lymphocytes Relative 55 %   Monocytes Relative 9 %   Eosinophils Relative 1 %   Basophils Relative 2 %   Neutro Abs 2.0 1.4 - 6.5 K/uL   Lymphs Abs 3.4 1.0 - 3.6 K/uL   Monocytes Absolute 0.6 0.2 - 0.9 K/uL   Eosinophils Absolute 0.1 0 - 0.7 K/uL   Basophils Absolute 0.1 0 - 0.1 K/uL  Comprehensive metabolic panel     Status: Abnormal   Collection Time: 09/12/15  7:51 AM  Result Value Ref Range   Sodium 141 135 - 145 mmol/L   Potassium 4.1 3.5 - 5.1 mmol/L   Chloride 107 101 - 111 mmol/L   CO2 24 22 - 32 mmol/L   Glucose, Bld 81 65 - 99 mg/dL   BUN <5 (L) 6 - 20 mg/dL   Creatinine, Ser 0.69 0.44 - 1.00 mg/dL   Calcium 9.2 8.9 - 10.3 mg/dL   Total Protein 8.6 (H) 6.5 - 8.1 g/dL   Albumin 4.1 3.5 - 5.0 g/dL   AST 47 (H) 15 - 41 U/L   ALT 33 14 - 54 U/L   Alkaline Phosphatase 72 38 - 126 U/L   Total Bilirubin 0.3 0.3 - 1.2 mg/dL   GFR calc non Af Amer >60 >60 mL/min   GFR calc Af Amer >60 >60 mL/min    Comment: (NOTE) The eGFR has been calculated using the CKD EPI equation. This calculation has not been validated in all clinical situations. eGFR's persistently <60 mL/min signify possible Chronic Kidney Disease.    Anion gap 10 5 - 15  Ethanol     Status: Abnormal   Collection Time: 09/12/15  7:51 AM  Result Value Ref Range   Alcohol, Ethyl (B) 314 (HH) <5 mg/dL    Comment: CRITICAL RESULT CALLED TO, READ BACK  BY AND VERIFIED WITH MARY NEEDHAM AT 5465 ON 09/12/15.Marland KitchenMarland KitchenNewtonia        LOWEST DETECTABLE LIMIT FOR SERUM ALCOHOL IS 5 mg/dL FOR MEDICAL PURPOSES ONLY   Salicylate level     Status: None   Collection Time: 09/12/15  7:51 AM  Result Value Ref Range   Salicylate Lvl <6.8 2.8 - 30.0 mg/dL  Acetaminophen level  Status: Abnormal   Collection Time: 09/12/15  7:51 AM  Result Value Ref Range   Acetaminophen (Tylenol), Serum <10 (L) 10 - 30 ug/mL    Comment:        THERAPEUTIC CONCENTRATIONS VARY SIGNIFICANTLY. A RANGE OF 10-30 ug/mL MAY BE AN EFFECTIVE CONCENTRATION FOR MANY PATIENTS. HOWEVER, SOME ARE BEST TREATED AT CONCENTRATIONS OUTSIDE THIS RANGE. ACETAMINOPHEN CONCENTRATIONS >150 ug/mL AT 4 HOURS AFTER INGESTION AND >50 ug/mL AT 12 HOURS AFTER INGESTION ARE OFTEN ASSOCIATED WITH TOXIC REACTIONS.   Urinalysis complete, with microscopic ([facility] only)     Status: Abnormal   Collection Time: 09/12/15  7:51 AM  Result Value Ref Range   Color, Urine STRAW (A) YELLOW   APPearance CLEAR (A) CLEAR   Glucose, UA NEGATIVE NEGATIVE mg/dL   Bilirubin Urine NEGATIVE NEGATIVE   Ketones, ur NEGATIVE NEGATIVE mg/dL   Specific Gravity, Urine 1.005 1.005 - 1.030   Hgb urine dipstick NEGATIVE NEGATIVE   pH 5.0 5.0 - 8.0   Protein, ur NEGATIVE NEGATIVE mg/dL   Nitrite NEGATIVE NEGATIVE   Leukocytes, UA NEGATIVE NEGATIVE   RBC / HPF 0-5 0 - 5 RBC/hpf   WBC, UA 0-5 0 - 5 WBC/hpf   Bacteria, UA NONE SEEN NONE SEEN   Squamous Epithelial / LPF 0-5 (A) NONE SEEN   Mucous PRESENT   Urine Drug Screen, Qualitative ([facility] only)     Status: Abnormal   Collection Time: 09/12/15  7:51 AM  Result Value Ref Range   Tricyclic, Ur Screen NONE DETECTED NONE DETECTED   Amphetamines, Ur Screen NONE DETECTED NONE DETECTED   MDMA (Ecstasy)Ur Screen NONE DETECTED NONE DETECTED   Cocaine Metabolite,Ur [place] POSITIVE (A) NONE DETECTED   Opiate, Ur Screen NONE DETECTED NONE DETECTED   Phencyclidine (PCP) Ur S NONE  DETECTED NONE DETECTED   Cannabinoid 50 Ng, Ur [place] NONE DETECTED NONE DETECTED   Barbiturates, Ur Screen NONE DETECTED NONE DETECTED   Benzodiazepine, Ur Scrn POSITIVE (A) NONE DETECTED   Methadone Scn, Ur NONE DETECTED NONE DETECTED    Comment: (NOTE) 673  Tricyclics, urine               Cutoff 1000 ng/mL 200  Amphetamines, urine             Cutoff 1000 ng/mL 300  MDMA (Ecstasy), urine           Cutoff 500 ng/mL 400  Cocaine Metabolite, urine       Cutoff 300 ng/mL 500  Opiate, urine                   Cutoff 300 ng/mL 600  Phencyclidine (PCP), urine      Cutoff 25 ng/mL 700  Cannabinoid, urine              Cutoff 50 ng/mL 800  Barbiturates, urine             Cutoff 200 ng/mL 900  Benzodiazepine, urine           Cutoff 200 ng/mL 1000 Methadone, urine                Cutoff 300 ng/mL 1100 1200 The urine drug screen provides only a preliminary, unconfirmed 1300 analytical test result and should not be used for non-medical 1400 purposes. Clinical consideration and professional judgment should 1500 be applied to any positive drug screen result due to possible 1600 interfering substances. A more specific alternate chemical method 1700 must be used in order to  obtain a confirmed analytical result.  1800 Gas chromato graphy / mass spectrometry (GC/MS) is the preferred 1900 confirmatory method.   Valproic acid level     Status: Abnormal   Collection Time: 09/12/15  7:51 AM  Result Value Ref Range   Valproic Acid Lvl <10 (L) 50.0 - 100.0 ug/mL    Current Facility-Administered Medications  Medication Dose Route Frequency Provider Last Rate Last Dose  . citalopram (CELEXA) tablet 20 mg  20 mg Oral Daily Gonzella Lex, MD      . folic acid (FOLVITE) tablet 1 mg  1 mg Oral Daily John T Clapacs, MD      . LORazepam (ATIVAN) tablet 1 mg  1 mg Oral Q6H PRN Gonzella Lex, MD       Or  . LORazepam (ATIVAN) injection 1 mg  1 mg Intravenous Q6H PRN Gonzella Lex, MD      . multivitamin with  minerals tablet 1 tablet  1 tablet Oral Daily John T Clapacs, MD      . thiamine (VITAMIN B-1) tablet 100 mg  100 mg Oral Daily Gonzella Lex, MD       Or  . thiamine (B-1) injection 100 mg  100 mg Intravenous Daily Gonzella Lex, MD      . traZODone (DESYREL) tablet 100 mg  100 mg Oral QHS Gonzella Lex, MD       Current Outpatient Prescriptions  Medication Sig Dispense Refill  . divalproex (DEPAKOTE SPRINKLE) 125 MG capsule Take 125 mg by mouth 2 (two) times daily.    . citalopram (CELEXA) 40 MG tablet Take 40 mg by mouth daily.    . traZODone (DESYREL) 150 MG tablet Take by mouth at bedtime.      Musculoskeletal: Strength & Muscle Tone: decreased Gait & Station: unable to stand Patient leans: N/A  Psychiatric Specialty Exam: Review of Systems  Constitutional: Positive for malaise/fatigue.  HENT: Negative.   Eyes: Negative.   Respiratory: Negative.   Cardiovascular: Negative.   Gastrointestinal: Negative.   Musculoskeletal: Positive for myalgias.  Skin: Negative.   Neurological: Positive for seizures and weakness.  Psychiatric/Behavioral: Positive for depression, suicidal ideas, hallucinations, memory loss and substance abuse. The patient is nervous/anxious and has insomnia.     Blood pressure 98/52, pulse 72, temperature 98.7 F (37.1 C), temperature source Oral, resp. rate 18, height _0  (1.626 m), weight 58.968 kg (130 lb), SpO2 100 %.Body mass index is 22.3 kg/(m^2).  General Appearance: Disheveled  Eye Contact::  Fair  Speech:  Garbled  Volume:  Decreased  Mood:  Depressed  Affect:  Depressed  Thought Process:  Tangential  Orientation:  Other:  She knows when it is to the month but not the year and she thinks she is in [employer] Content:  Hallucinations: Visual  Suicidal Thoughts:  Yes.  with intent/plan  Homicidal Thoughts:  No  Memory:  Immediate;   Fair Recent;   Poor Remote;   Fair  Judgement:  Poor  Insight:  Shallow  Psychomotor  Activity:  Decreased  Concentration:  Poor  Recall:  Poor  Fund of Knowledge:Fair  Language: Fair  Akathisia:  No  Handed:  Right  AIMS (if indicated):     Assets:  Desire for Improvement Physical Health Resilience  ADL's:  Intact  Cognition: Impaired,  Mild  Sleep:      Treatment Plan Summary: Daily contact with patient to assess and evaluate symptoms and progress in treatment, Medication  management and Plan Patient presents with alcohol level over 300 also drug screen positive for cocaine. Mood is stated as very depressed and her affect is very depressed. Tearful. Reports suicidal thoughts with thoughts of walking out into traffic or jumping off a building. Noncompliant with treatment. Very minimal outpatient supports. Patient meets commitment criteria and will be admitted to the hospital. Detox orders in place. Restart citalopram and trazodone. Reviewed plan with the patient. Reviewed labs and vital signs. With emergency room doctor  Disposition: Recommend psychiatric Inpatient admission when medically cleared. Supportive therapy provided about ongoing stressors. Discussed crisis plan, support from social network, calling 911, coming to the Emergency Department, and calling Suicide Hotline.  John Clapacs 09/12/2015 5:27 PM

## 2015-09-12 NOTE — ED Notes (Signed)
BEHAVIORAL HEALTH ROUNDING Patient sleeping: No. Patient alert and oriented: yes Behavior appropriate: No.; If no, describe: cursing/threatening staff Nutrition and fluids offered: Yes  Toileting and hygiene offered: Yes  Sitter present: yes Law enforcement present: No

## 2015-09-12 NOTE — ED Notes (Signed)
BEHAVIORAL HEALTH ROUNDING Patient sleeping: No. Patient alert and oriented: yes Behavior appropriate: Yes.  ; If no, describe: Patient walked from room to bathroom with steady gait. Nutrition and fluids offered: No Toileting and hygiene offered: Yes  Sitter present: Behavioral tech rounding every 15 minutes for safety. Law enforcement present: Yes  and [employer], television/film set.

## 2015-09-13 ENCOUNTER — Encounter: Payer: Self-pay | Admitting: Psychiatry

## 2015-09-13 DIAGNOSIS — F332 Major depressive disorder, recurrent severe without psychotic features: Principal | ICD-10-CM

## 2015-09-13 MED ORDER — TRAZODONE HCL 150 MG PO TABS: 150.0000 mg | ORAL_TABLET | Freq: Every day | ORAL | Status: DC

## 2015-09-13 MED ORDER — DIVALPROEX SODIUM 500 MG PO DR TAB
500.0000 mg | DELAYED_RELEASE_TABLET | Freq: Three times a day (TID) | ORAL | Status: DC
  Administered 2015-09-13 – 2015-09-14 (×2): 500 mg via ORAL
  Filled 2015-09-13 (×3): qty 1

## 2015-09-13 MED ORDER — CHLORDIAZEPOXIDE HCL 25 MG PO CAPS
25.0000 mg | ORAL_CAPSULE | Freq: Four times a day (QID) | ORAL | Status: DC
  Administered 2015-09-13 – 2015-09-14 (×4): 25 mg via ORAL
  Filled 2015-09-13 (×5): qty 1

## 2015-09-13 MED ORDER — CITALOPRAM HYDROBROMIDE 40 MG PO TABS: 40.0000 mg | ORAL_TABLET | Freq: Every day | ORAL | Status: DC

## 2015-09-13 MED ORDER — DIVALPROEX SODIUM 500 MG PO DR TAB: 500.0000 mg | DELAYED_RELEASE_TABLET | Freq: Two times a day (BID) | ORAL | Status: DC

## 2015-09-13 NOTE — BHH Suicide Risk Assessment (Addendum)
[facility] Admission Suicide Risk Assessment   Nursing information obtained from:    Demographic factors:    Current Mental Status:    Loss Factors:    Historical Factors:    Risk Reduction Factors:    Total Time spent with patient: 1 hour Principal Problem: Severe recurrent major depression without psychotic features Diagnosis:   Patient Active Problem List   Diagnosis Date Noted  . Severe recurrent major depression without psychotic features [F33.2] 09/12/2015  . Alcohol use disorder, severe, dependence [F10.20] 08/31/2015  . Alcohol withdrawal [F10.239] 08/31/2015  . Stimulant use disorder (cocaine) [F15.99] 08/31/2015  . 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  World Science writer [facility]). Score between 0-7:  no or low risk or alcohol related problems. Score between 8-15:  moderate risk of alcohol related problems. Score between 16-19:  high risk of alcohol related problems. Score 20 or above:  warrants further diagnostic evaluation for alcohol dependence and treatment.   CLINICAL FACTORS:   Bipolar Disorder:   Depressive phase Alcohol/Substance Abuse/Dependencies   Musculoskeletal: Strength & Muscle Tone: within normal limits Gait & Station: normal Patient leans: N/A  Psychiatric Specialty Exam: Reveals physical exam performed in the emergency room and agreed with the findings. Physical Exam  Nursing note and vitals reviewed.   Review of Systems  All other systems reviewed and are negative.   Blood pressure 119/79, pulse 120, temperature 97.3 F (36.3 C), temperature source Oral, resp. rate 20, height  (1.626 m), weight 58.968 kg (130 lb), last menstrual period 08/30/2015, SpO2 100 %.Body mass index is 22.3 kg/(m^2).   General Appearance: Casual  Eye Contact::  Good  Speech:  Normal Rate  Volume:  Normal  Mood:  Euthymic  Affect:  Appropriate  Thought Process:  Goal Directed  Orientation:  Full (Time, Place, and Person)  Thought Content:  WDL  Suicidal Thoughts:  No  Homicidal Thoughts:  No  Memory:  Immediate;   Fair Recent;   Fair Remote;   Fair  Judgement:  Impaired  Insight:  Shallow  Psychomotor Activity:  Normal  Concentration:  Fair  Recall:  [employer] of Knowledge:Fair  Language: Fair  Akathisia:  No  Handed:  Right  AIMS (if indicated):     Assets:  Communication Skills Desire for Improvement Physical Health Resilience  Sleep:  Number of Hours: 6.45  Cognition: WNL  ADL's:  Intact     COGNITIVE FEATURES THAT CONTRIBUTE TO RISK:  None    SUICIDE RISK:   Minimal: No identifiable suicidal ideation.  Patients presenting with no risk factors but with morbid ruminations; may be classified as minimal risk based on the severity of the depressive symptoms  PLAN OF CARE: Hospital admission, medication management, and substance abuse counseling, discharge planning.  Medical Decision Making:  New problem, with additional work up planned, Review of Psycho-Social Stressors (1), Review or order clinical lab tests (1), Review of Medication Regimen & Side Effects (2) and Review of New Medication or Change in Dosage (2)   Ms. Farino is a 39 year old female with a history of depression, mood instability, and substance use admitted for suicidal threats while drunk.  1. Suicidal ideation. The patient denies any thoughts, intention, or plans to hurt herself or others.  2. Alcohol detox. The patient is on Librium taper.  There is history of seizures.  3. Substance abuse treatment. The patient is not interested in residential treatment however given the fact that she keeps coming back to the hospital with elevated blood alcohol level and positive for cocaine and benzodiazepine we will refer to  [facility] facility.  4. Mood. The patient was diagnosed with bipolar disorder in the past and did well on Depakote. She also has seizures. She has never been compliant with medication. During her last hospitalization she was discharged on a combination of Celexa and trazodone which she did not take the community. We'll start Depakote again.  5. Insomnia. We'll offer trazodone.  6. Disposition. To be established.  I certify that inpatient services furnished can reasonably be expected to improve the patient's condition.   Raliegh Scobie 09/13/2015, 11:05 AM

## 2015-09-13 NOTE — Progress Notes (Signed)
Pt slept well throughout the night. No behavior issues noted. No s/s of W/D noted.  Slept 6.45 hours.

## 2015-09-13 NOTE — BHH Group Notes (Signed)
B[facility] Group Notes:  (Nursing/MHT/Case Management/Adjunct)  Date:  09/13/2015  Time:  1:37 PM  Type of Therapy:  Psychoeducational Skills  Participation Level:  Did Not Attend   Kara Lawrence 09/13/2015, 1:37 PM

## 2015-09-13 NOTE — Clinical Social Work Note (Signed)
CSW met with patient and patient was in bed appeared to be sleeping but did wake up and minimally responding to assessment questions and was unable to provide consents and will meet with patient at a later time.

## 2015-09-13 NOTE — H&P (Addendum)
Psychiatric Admission Assessment Adult  Patient Identification: Kara Lawrence MRN:  [id number] Date of Evaluation:  09/13/2015 Chief Complaint:  Bipolar Principal Diagnosis: Severe recurrent major depression without psychotic features Diagnosis:   Patient Active Problem List   Diagnosis Date Noted  . Severe recurrent major depression without psychotic features [F33.2] 09/12/2015  . Alcohol use disorder, severe, dependence [F10.20] 08/31/2015  . Alcohol withdrawal [F10.239] 08/31/2015  . Stimulant use disorder (cocaine) [F15.99] 08/31/2015  . 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 Kara Lawrence is a 39 year old female with a history of depression, mood instability, and substance use.   Chief complaint. "I am not suicidal."  History of present illness. Information was obtained from the patient and the chart. Kara Lawrence has a long history of substance use and mood instability. As she was hospitalized here a few weeks ago. She was discharged on a combination of trazodone and Celexa. She did not follow-up with her outpatient provider and was noncompliant with medications. She relapsed on alcohol and cocaine immediately following and has been drinking heavily. Her blood alcohol on admission was 331. She was also positive for cocaine and benzodiazepine. The patient has a history of seizures. He reports that she had a seizure episodes prior to coming to the emergency room. She reports some symptoms of depression with poor sleep, decreased appetite, poor energy and concentration. On the day of admission she reportedly made some suicidal threats as well as threats against her husband while drunk. She now denies feeling suicidal or homicidal. She feels sad and tired from the binge. She is not interested in residential substance abuse treatment but given the extend of her heartbeat that we will try to refer her to  [facility] program. She denies symptoms of anxiety. There are no psychotic symptoms. There are no symptoms suggestive of bipolar mania.   Past psychiatric history. There are multiple psychiatric hospitalizations for depression and substance use. She denies ever attempting suicide. She was at [facility] before. She has been tried on several medications. Apparently Depakote works for her.  Family psychiatric history. Other family members with substance abuse.  Social history. She is homeless and uninsured.  Total Time spent with patient: 1 hour  Past Psychiatric History:   Risk to Self: Is patient at risk for suicide?: Yes Risk to Others:   Prior Inpatient Therapy:   Prior Outpatient Therapy:    Alcohol Screening: 1. How often do you have a drink containing alcohol?: 4 or more times a week 2. How many drinks containing alcohol do you have on a typical day when you are drinking?: 10 or more 3. How often do you have six or more drinks on one occasion?: Daily or almost daily Preliminary Score: 8 4. How often during the last year have you found that you were not able to stop drinking once you had started?: Weekly 5. How often during the last year have you failed to do what was normally expected from you becasue of drinking?: Weekly 6. How often during the last year have you needed a first drink in the morning to get yourself going after a heavy drinking session?: Weekly 7. How often during the last year have you had a feeling of guilt of remorse after drinking?: Weekly 8. How often during the last year have you been unable to remember what happened the night before because you had been drinking?: Weekly 9. Have you or someone else been injured as a  result of your drinking?: No 10. Has a relative or friend or a doctor or another health worker been concerned about your drinking or suggested you cut down?: No Alcohol Use Disorder Identification Test Final Score (AUDIT): 27 Brief Intervention:  Yes Substance Abuse History in the last 12 months:  Yes.   Consequences of Substance Abuse: Negative Previous Psychotropic Medications: Yes  Psychological Evaluations: No  Past Medical History:  Past Medical History  Diagnosis Date  . Seizure   . Bipolar 1 disorder   . Manic depression     Past Surgical History  Procedure Laterality Date  . Tubal ligation Bilateral    Family History: History reviewed. No pertinent family history. Family Psychiatric  History:  Social History:  History  Alcohol Use  . Yes    Comment: pt smells strongly of ETOH     History  Drug Use  . Yes  . Special: "Crack" cocaine, Benzodiazepines    Social History   Social History  . Marital Status: Single    Spouse Name: N/A  . Number of Children: N/A  . Years of Education: N/A   Social History Main Topics  . Smoking status: Never Smoker   . Smokeless tobacco: Never Used  . Alcohol Use: Yes     Comment: pt smells strongly of ETOH  . Drug Use: Yes    Special: "Crack" cocaine, Benzodiazepines  . Sexual Activity: Yes    Birth Control/ Protection: None   Other Topics Concern  . None   Social History Narrative   Additional Social History:    History of alcohol / drug use?: Yes Negative Consequences of Use: Financial, Personal relationships Withdrawal Symptoms: Agitation                    Allergies:  No Known Allergies Lab Results:  Results for orders placed or performed during the hospital encounter of 09/12/15 (from the past 48 hour(s))  TSH     Status: None   Collection Time: 09/12/15  7:51 AM  Result Value Ref Range   TSH 3.358 0.350 - 4.500 uIU/mL    Metabolic Disorder Labs:  No results found for: HGBA1C, MPG No results found for: PROLACTIN No results found for: CHOL, TRIG, HDL, CHOLHDL, VLDL, LDLCALC  Current Medications: Current Facility-Administered Medications  Medication Dose Route Frequency Provider Last Rate Last Dose  . acetaminophen (TYLENOL) tablet 650  mg  650 mg Oral Q6H PRN Audery Amel, MD      . alum & mag hydroxide-simeth (MAALOX/MYLANTA) 200-200-20 MG/5ML suspension 30 mL  30 mL Oral Q4H PRN Audery Amel, MD      . chlordiazePOXIDE (LIBRIUM) capsule 25 mg  25 mg Oral QID Shari Prows, MD   25 mg at 09/13/15 0903  . citalopram (CELEXA) tablet 20 mg  20 mg Oral Daily Audery Amel, MD   20 mg at 09/13/15 0904  . folic acid (FOLVITE) tablet 1 mg  1 mg Oral Daily Audery Amel, MD   1 mg at 09/13/15 0903  . LORazepam (ATIVAN) tablet 1 mg  1 mg Oral Q6H PRN Audery Amel, MD   1 mg at 09/12/15 2217   Or  . LORazepam (ATIVAN) injection 1 mg  1 mg Intravenous Q6H PRN Audery Amel, MD      . magnesium hydroxide (MILK OF MAGNESIA) suspension 30 mL  30 mL Oral Daily PRN Audery Amel, MD      . multivitamin with minerals  tablet 1 tablet  1 tablet Oral Daily Audery Amel, MD   1 tablet at 09/13/15 [phone]  . thiamine (VITAMIN B-1) tablet 100 mg  100 mg Oral Daily Audery Amel, MD   100 mg at 09/13/15 0981   Or  . thiamine (B-1) injection 100 mg  100 mg Intravenous Daily Audery Amel, MD      . traZODone (DESYREL) tablet 100 mg  100 mg Oral QHS Audery Amel, MD   100 mg at 09/12/15 2216   PTA Medications: Prescriptions prior to admission  Medication Sig Dispense Refill Last Dose  . citalopram (CELEXA) 40 MG tablet Take 40 mg by mouth daily.   Past Week at Unknown time  . traZODone (DESYREL) 150 MG tablet Take by mouth at bedtime.   Past Week at Unknown time    Musculoskeletal: Strength & Muscle Tone: within normal limits Gait & Station: normal Patient leans: N/A  Psychiatric Specialty Exam: Physical Exam  Nursing note and vitals reviewed.   Review of Systems  All other systems reviewed and are negative.   Blood pressure 119/79, pulse 120, temperature 97.3 F (36.3 C), temperature source Oral, resp. rate 20, height  (1.626 m), weight 58.968 kg (130 lb), last menstrual period 08/30/2015, SpO2 100 %.Body mass  index is 22.3 kg/(m^2).  See SRA.                                                  Sleep:  Number of Hours: 6.45     Treatment Plan Summary: Daily contact with patient to assess and evaluate symptoms and progress in treatment and Medication management   Kara Lawrence is a 39 year old female with a history of depression, mood instability, and substance use admitted for suicidal threats while drunk.  1. Suicidal ideation. The patient denies any thoughts, intention, or plans to hurt herself or others.  2. Alcohol detox. The patient is on Librium taper. There is history of seizures.  3. Substance abuse treatment. The patient is not interested in residential treatment however given the fact that she keeps coming back to the hospital with elevated blood alcohol level and positive for cocaine and benzodiazepine we will refer to [facility] facility.  4. Mood. The patient was diagnosed with bipolar disorder in the past and did well on Depakote. She also has seizures. She has never been compliant with medication. During her last hospitalization she was discharged on a combination of Celexa and trazodone which she did not take the community. We'll start Depakote again.  5. Insomnia. We'll offer trazodone.  6. Disposition. To be established.  Observation Level/Precautions:  15 minute checks Seizure  Laboratory:  CBC Chemistry Profile UDS UA  Psychotherapy:    Medications:    Consultations:    Discharge Concerns:    Estimated LOS:  Other:     I certify that inpatient services furnished can reasonably be expected to improve the patient's condition.   Jolanta Pucilowska 9/29/201611:24 AM

## 2015-09-13 NOTE — Progress Notes (Signed)
Patient with appropriate affect and cooperative behavior with meals, meds and plan of care. No SI/HI at this time. Pleasant and sleepy in bed this am and with minimal interaction with peers. At times patient is irritable and then mood improves. Good appetite and po fluids encouraged. No s/s of withdrawal. Safety maintained.

## 2015-09-14 NOTE — [facility] Group Notes (Signed)
[facility] Group Notes:  (Nursing/MHT/Case Management/Adjunct)  Date:  09/14/2015  Time:  12:11 PM  Type of Therapy:  Psychoeducational Skills  Participation Level:  Did Not Attend  P  Kara Lawrence 09/14/2015, 12:11 PM

## 2015-09-14 NOTE — [facility] Group Notes (Signed)
[facility] LCSW Group Therapy  09/14/2015 3:18 PM  Type of Therapy:  Group Therapy  Participation Level:  Did Not Attend   Lulu Riding, MSW, LCSWA 09/14/2015, 3:18 PM

## 2015-09-14 NOTE — Tx Team (Signed)
Interdisciplinary Treatment Plan Update (Adult)  Date:  09/14/2015 Time Reviewed:  11:37 AM  Progress in Treatment: Attending groups: No. Participating in groups:  No. Taking medication as prescribed:  Yes. Tolerating medication:  Yes. Family/Significant othe contact made:  Yes, individual(s) contacted:  patient's boyfriend Cy Blamer [phone] Patient understands diagnosis:  Yes. Discussing patient identified problems/goals with staff:  Yes. Medical problems stabilized or resolved:  Yes. Denies suicidal/homicidal ideation: Yes. Issues/concerns per patient self-inventory:  No. Other:  New problem(s) identified: No, Describe:  none reported  Discharge Plan or Barriers: Patient will discharge home with her grandmother. Patient's boyfriend is a support but she will need [employer] bus fare to get home at [place] as no one drives in her family. Patient will followup at [facility] after discharge.   Reason for Continuation of Hospitalization: Depression Suicidal ideation  Comments:  Estimated length of stay: up to 1 day expected discharge 09/14/15  New goal(s):  Review of initial/current patient goals per problem list:   1.  Goal(s): decrease depression  Met:  No  Target date: 09/14/15  As evidenced by: patient report of depression and laying in bed all day  2.  Goal (s): illiminate SI  Met:  Yes  Target date: 09/14/15  As evidenced by: patients report of no SI   Attendees: Physician:  Orson Slick, MD 9/30/201611:37 AM  Nursing:   Elige Radon, RN 9/30/201611:37 AM  Other:  Carmell Austria, [place] 9/30/201611:37 AM  Other:   9/30/201611:37 AM  Other:   9/30/201611:37 AM  Other:  9/30/201611:37 AM  Other:  9/30/201611:37 AM  Other:  9/30/201611:37 AM  Other:  9/30/201611:37 AM  Other:  9/30/201611:37 AM  Other:  9/30/201611:37 AM  Other:   9/30/201611:37 AM   Scribe for Treatment Team:   Keene Breath, MSW, LCSWA  09/14/2015, 11:37 AM

## 2015-09-14 NOTE — Progress Notes (Signed)
  [facility] Adult Case Management Discharge Plan :  Will you be returning to the same living situation after discharge:  Yes,  home with her grandmother At discharge, do you have transportation home?: Yes,  patient called a friend to pick her up Do you have the ability to pay for your medications: Yes,  patient gets her medications from [facility] medication closet  Release of information consent forms completed and in the chart;  Patient's signature needed at discharge.  Patient to Follow up at: Follow-up Information    Follow up with [facility]. Go on 09/17/2015.   Why:  For follow-up care appt Monday 09/17/15 at 8:00am (walk in hours are MWF 8-3) Kara Lawrence [phone]   Contact information:   [address] [place], [place] Ph [phone] Fax [phone]      Patient denies SI/HI: Yes,  patient denies SI/HI     Safety Planning and Suicide Prevention discussed: Yes,  SPE discussed with patient and her boyfriend Kara Lawrence [phone]  Have you used any form of tobacco in the last 30 days? (Cigarettes, Smokeless Tobacco, Cigars, and/or Pipes): No  Has patient been referred to the Quitline?: N/A patient is not a smoker  Kara Lawrence, MSW, LCSWA 09/14/2015, 11:41 AM

## 2015-09-14 NOTE — [facility] Suicide Risk Assessment (Signed)
[facility] INPATIENT:  Family/Significant Other Suicide Prevention Education  Suicide Prevention Education:  Education Completed; Erlinda Hong (boyfriend) [phone] has been identified by the patient as the family member/significant other with whom the patient will be residing, and identified as the person(s) who will aid the patient in the event of a mental health crisis (suicidal ideations/suicide attempt).  With written consent from the patient, the family member/significant other has been provided the following suicide prevention education, prior to the and/or following the discharge of the patient.  The suicide prevention education provided includes the following:  Suicide risk factors  Suicide prevention and interventions  National Suicide Hotline telephone number  [facility] assessment telephone number  [facility] Emergency Assistance 911  [facility] and/or Residential Mobile Crisis Unit telephone number  Request made of family/significant other to:  Remove weapons (e.g., guns, rifles, knives), all items previously/currently identified as safety concern.    Remove drugs/medications (over-the-counter, prescriptions, illicit drugs), all items previously/currently identified as a safety concern.  The family member/significant other verbalizes understanding of the suicide prevention education information provided.  The family member/significant other agrees to remove the items of safety concern listed above.  Lulu Riding, MSW, LCSWA 09/14/2015, 10:56 AM

## 2015-09-14 NOTE — Plan of Care (Signed)
Problem: Alteration in mood Goal: LTG-Pt's behavior demonstrates decreased signs of depression (Patient's behavior demonstrates decreased signs of depression to the point the patient is safe to return home and continue treatment in an outpatient setting)  Outcome: Not Progressing Patient not attending group or socializing with peers.

## 2015-09-14 NOTE — [facility] Group Notes (Deleted)
[facility] Group Notes:  (Nursing/MHT/Case Management/Adjunct)  Date:  09/14/2015  Time:  12:10 PM  Type of Therapy:  Psychoeducational Skills  Participation Level:  Active  Participation Quality:  Appropriate  Affect:  Appropriate  Cognitive:  Appropriate  Insight:  Appropriate  Engagement in Group:  Engaged  Modes of Intervention:  Discussion and Education    Summary of Progress/Problems:  [name] [name] 09/14/2015, 12:10 PM 

## 2015-09-14 NOTE — Progress Notes (Signed)
Patient looks tense although talks easily.  Denies depression or SI.  Verbalizes that she is ready to go home and realizes that she needs to stop drinking.  Further states she is around alcohol all the time and that makes it even harder. Discharge instructions given, verbalized understanding.  Prescriptions and seven day supply given.  Personal belongings returned.  Escorted off unit by this Clinical research associate to meet friend to travel home.

## 2015-09-14 NOTE — Plan of Care (Signed)
Problem: Alteration in mood Goal: LTG-Patient reports reduction in suicidal thoughts (Patient reports reduction in suicidal thoughts and is able to verbalize a safety plan for whenever patient is feeling suicidal)  Outcome: Progressing Patient denies SI/HI.      

## 2015-09-14 NOTE — Progress Notes (Addendum)
Patient alert and oriented x 4 ,denies SI/HI, affect is flat and sad. Patient is hostile towards staff, mood is irritable, angry and verbally agressive. Patient was unwilling to participate in plan of care. Pt refused to get up for evening group, also she refused her medication. No distress noted at this time, resting in bed with eyes closed. 15 minutes checks maintained, will continue to monitor.

## 2015-09-14 NOTE — [facility] Suicide Risk Assessment (Signed)
[facility] Discharge Suicide Risk Assessment   Demographic Factors:  Low socioeconomic status and Unemployed  Total Time spent with patient: 30 minutes  Musculoskeletal: Strength & Muscle Tone: within normal limits Gait & Station: normal Patient leans: N/A  Psychiatric Specialty Exam: Physical Exam  Nursing note and vitals reviewed.   Review of Systems  All other systems reviewed and are negative.   Blood pressure 111/76, pulse 102, temperature 98.8 F (37.1 C), temperature source Oral, resp. rate 20, height  (1.626 m), weight 58.968 kg (130 lb), last menstrual period 08/30/2015, SpO2 100 %.Body mass index is 22.3 kg/(m^2).  General Appearance: Casual  Eye Contact::  Good  Speech:  Clear and Coherent409  Volume:  Normal  Mood:  Euthymic  Affect:  Appropriate  Thought Process:  Goal Directed  Orientation:  Full (Time, Place, and Person)  Thought Content:  WDL  Suicidal Thoughts:  No  Homicidal Thoughts:  No  Memory:  Immediate;   Fair Recent;   Fair Remote;   Fair  Judgement:  Fair  Insight:  Fair  Psychomotor Activity:  Normal  Concentration:  Fair  Recall:  [employer] of Knowledge:Fair  Language: Fair  Akathisia:  No  Handed:  Right  AIMS (if indicated):     Assets:  Communication Skills Desire for Improvement Physical Health Resilience  Sleep:  Number of Hours: 8.5  Cognition: WNL  ADL's:  Intact   Have you used any form of tobacco in the last 30 days? (Cigarettes, Smokeless Tobacco, Cigars, and/or Pipes): No  Has this patient used any form of tobacco in the last 30 days? (Cigarettes, Smokeless Tobacco, Cigars, and/or Pipes) No  Mental Status Per Nursing Assessment::   On Admission:     Current Mental Status by Physician: NA  Loss Factors: Financial problems/change in socioeconomic status  Historical Factors: Impulsivity  Risk Reduction Factors:   Sense of responsibility to family  Continued Clinical Symptoms:  Bipolar Disorder:   Depressive  phase Alcohol/Substance Abuse/Dependencies  Cognitive Features That Contribute To Risk:  None    Suicide Risk:  Minimal: No identifiable suicidal ideation.  Patients presenting with no risk factors but with morbid ruminations; may be classified as minimal risk based on the severity of the depressive symptoms  Principal Problem: Severe recurrent major depression without psychotic features Discharge Diagnoses:  Patient Active Problem List   Diagnosis Date Noted  . Severe recurrent major depression without psychotic features [F33.2] 09/12/2015  . Alcohol use disorder, severe, dependence [F10.20] 08/31/2015  . Alcohol withdrawal [F10.239] 08/31/2015  . Stimulant use disorder (cocaine) [F15.99] 08/31/2015  . 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 Diet:  low sodium heart healthy. Other:  keep follow up appointments.  Is patient on multiple antipsychotic therapies at discharge:  No   Has Patient had three or more failed trials of antipsychotic monotherapy by history:  No  Recommended Plan for Multiple Antipsychotic Therapies: NA    Jordynn Perrier 09/14/2015, 7:42 AM

## 2015-09-14 NOTE — Discharge Summary (Signed)
Physician Discharge Summary Note  Patient:  Kara Lawrence is an 39 y.o., female MRN:  [id number] DOB:  January 13, 1976 Patient phone:  [phone] (home)  Patient address:   [address] [place] [address],  Total Time spent with patient: 30 minutes  Date of Admission:  09/12/2015 Date of Discharge: 09/14/2015  Reason for Admission:  Suicidal ideation.  Identifying data. Kara Lawrence is a 39 year old female with a history of depression, mood instability, and substance use.   Chief complaint. "I am not suicidal."  History of present illness. Information was obtained from the patient and the chart. Kara Lawrence has a long history of substance use and mood instability. As Kara Lawrence was hospitalized here a few weeks ago. Kara Lawrence was discharged on a combination of trazodone and Celexa. Kara Lawrence did not follow-up with her outpatient Arretta Toenjes and was noncompliant with medications. Kara Lawrence relapsed on alcohol and cocaine immediately following and has been drinking heavily. Her blood alcohol on admission was 331. Kara Lawrence was also positive for cocaine and benzodiazepine. The patient has a history of seizures. He reports that Kara Lawrence had a seizure episodes prior to coming to the emergency room. Kara Lawrence reports some symptoms of depression with poor sleep, decreased appetite, poor energy and concentration. On the day of admission Kara Lawrence reportedly made some suicidal threats as well as threats against her husband while drunk. Kara Lawrence now denies feeling suicidal or homicidal. Kara Lawrence feels sad and tired from the binge. Kara Lawrence is not interested in residential substance abuse treatment but given the extend of her heartbeat that we will try to refer her to [facility] program. Kara Lawrence denies symptoms of anxiety. There are no psychotic symptoms. There are no symptoms suggestive of bipolar mania.   Past psychiatric history. There are multiple psychiatric hospitalizations for depression and substance use. Kara Lawrence denies ever attempting suicide. Kara Lawrence was at [facility] before. Kara Lawrence  has been tried on several medications. Apparently Depakote works for her.  Family psychiatric history. Other family members with substance abuse.  Social history. Kara Lawrence is homeless and uninsured.  Principal Problem: Severe recurrent major depression without psychotic features Discharge Diagnoses: Patient Active Problem List   Diagnosis Date Noted  . Severe recurrent major depression without psychotic features [F33.2] 09/12/2015  . Alcohol use disorder, severe, dependence [F10.20] 08/31/2015  . Alcohol withdrawal [F10.239] 08/31/2015  . Stimulant use disorder (cocaine) [F15.99] 08/31/2015  . 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   Review of Systems  All other systems reviewed and are negative.   Blood pressure 111/76, pulse 102, temperature 98.8 F (37.1 C), temperature source Oral, resp. rate 20, height  (1.626 m), weight 58.968 kg (130 lb), last menstrual period 08/30/2015, SpO2 100 %.Body mass index is 22.3 kg/(m^2).  See SRA.                                                  Sleep:  Number of Hours: 8.5   Have you used any form of tobacco in the last 30 days? (Cigarettes, Smokeless Tobacco, Cigars, and/or Pipes): No  Has this patient used any form of tobacco in the last 30 days? (Cigarettes, Smokeless Tobacco, Cigars, and/or Pipes) No  Past Medical History:  Past Medical History  Diagnosis Date  . Seizure   . Bipolar 1 disorder   . Manic depression     Past Surgical History  Procedure Laterality Date  . Tubal ligation Bilateral    Family History: History reviewed. No pertinent family history. Social History:  History  Alcohol Use  . Yes    Comment: pt smells strongly of ETOH     History  Drug Use  . Yes   . Special: "Crack" cocaine, Benzodiazepines    Social History   Social History  . Marital Status: Single    Spouse Name: N/A  . Number of Children: N/A  . Years of Education: N/A   Social History Main Topics  . Smoking status: Never Smoker   . Smokeless tobacco: Never Used  . Alcohol Use: Yes     Comment: pt smells strongly of ETOH  . Drug Use: Yes    Special: "Crack" cocaine, Benzodiazepines  . Sexual Activity: Yes    Birth Control/ Protection: None   Other Topics Concern  . None   Social History Narrative    Past Psychiatric History: Hospitalizations:  Outpatient Care:  Substance Abuse Care:  Self-Mutilation:  Suicidal Attempts:  Violent Behaviors:   Risk to Self: Is patient at risk for suicide?: Yes Risk to Others:   Prior Inpatient Therapy:   Prior Outpatient Therapy:    Level of Care:  OP  Hospital Course:    Ms. Leopard is a 39 year old female with a history of depression, mood instability, and substance use admitted for suicidal threats while drunk.  1. Suicidal ideation. This has resolved. The patient denies any thoughts, intention, or plans to hurt herself or others.  2. Alcohol detox. The patient completed Librium taper. There is history of seizures. This was uncomplicated detox vital signs were stable.  3. Substance abuse treatment. The patient is not interested in residential treatment.   4. Mood. The patient was diagnosed with bipolar disorder in the past and did well on Depakote. Kara Lawrence also has seizures. Kara Lawrence has never been compliant with medication. During her last hospitalization Kara Lawrence was discharged on a combination of Celexa and trazodone which Kara Lawrence did not take the community. We'll start Depakote again.  5. Insomnia. We offered trazodone.  6. Disposition. Kara Lawrence was discharged to her grandmother's place. Kara Lawrence will follow up with [facility] substance abuse outpatient treatment.   Consults:  None  Significant Diagnostic Studies:  None  Discharge Vitals:    Blood pressure 111/76, pulse 102, temperature 98.8 F (37.1 C), temperature source Oral, resp. rate 20, height  (1.626 m), weight 58.968 kg (130 lb), last menstrual period 08/30/2015, SpO2 100 %. Body mass index is 22.3 kg/(m^2). Lab Results:   Results for orders placed or performed during the hospital encounter of 09/12/15 (from the past 72 hour(s))  TSH     Status: None   Collection Time: 09/12/15  7:51 AM  Result Value Ref Range   TSH 3.358 0.350 - 4.500 uIU/mL    Physical Findings: AIMS: Facial and Oral Movements Muscles of Facial Expression: None, normal Lips and Perioral Area: None, normal Jaw: None, normal Tongue: None, normal,Extremity Movements Upper (arms, wrists, hands, fingers): None, normal Lower (legs, knees, ankles, toes): None, normal, Trunk Movements Neck, shoulders, hips: None, normal, Overall Severity Severity of abnormal movements (highest score from questions above): None, normal Incapacitation due to abnormal movements: None, normal Patient's awareness of abnormal movements (rate only patient's report): No Awareness, Dental Status Current problems with teeth and/or  dentures?: No Does patient usually wear dentures?: No  CIWA:  CIWA-Ar Total: 4 COWS:  COWS Total Score: 5   See Psychiatric Specialty Exam and Suicide Risk Assessment completed by Attending Physician prior to discharge.  Discharge destination:  Home  Is patient on multiple antipsychotic therapies at discharge:  No   Has Patient had three or more failed trials of antipsychotic monotherapy by history:  No    Recommended Plan for Multiple Antipsychotic Therapies: NA  Discharge Instructions    Diet - low sodium heart healthy    Complete by:  As directed      Increase activity slowly    Complete by:  As directed             Medication List    TAKE these medications      Indication   citalopram 40 MG tablet  Commonly known as:  CELEXA  Take 1 tablet (40 mg total) by mouth  daily.   Indication:  Depression     divalproex 500 MG DR tablet  Commonly known as:  DEPAKOTE  Take 1 tablet (500 mg total) by mouth 2 (two) times daily.   Indication:  Rapidly Alternating Manic-Depressive Psychosis     traZODone 150 MG tablet  Commonly known as:  DESYREL  Take 1 tablet (150 mg total) by mouth at bedtime.   Indication:  Trouble Sleeping         Follow-up recommendations:  Activity:  As tolerated. Diet:  Low sodium heart healthy. Other:  Keep follow-up appointments.  Comments:    Total Discharge Time: 35 min.  Signed: Kristine Linea 09/14/2015, 10:43 AM

## 2015-10-14 ENCOUNTER — Emergency Department
Admission: EM | Admit: 2015-10-14 | Discharge: 2015-10-14 | Disposition: A | Discharge status: Home / Self Care | Payer: Self-pay | Attending: Emergency Medicine | Admitting: Emergency Medicine

## 2015-10-14 ENCOUNTER — Encounter: Payer: Self-pay | Admitting: Emergency Medicine

## 2015-10-14 DIAGNOSIS — F1310 Sedative, hypnotic or anxiolytic abuse, uncomplicated: Secondary | ICD-10-CM | POA: Insufficient documentation

## 2015-10-14 DIAGNOSIS — Z79899 Other long term (current) drug therapy: Secondary | ICD-10-CM | POA: Insufficient documentation

## 2015-10-14 DIAGNOSIS — F1210 Cannabis abuse, uncomplicated: Secondary | ICD-10-CM | POA: Insufficient documentation

## 2015-10-14 DIAGNOSIS — F329 Major depressive disorder, single episode, unspecified: Secondary | ICD-10-CM | POA: Insufficient documentation

## 2015-10-14 DIAGNOSIS — F32A Depression, unspecified: Secondary | ICD-10-CM

## 2015-10-14 DIAGNOSIS — F1410 Cocaine abuse, uncomplicated: Secondary | ICD-10-CM | POA: Insufficient documentation

## 2015-10-14 LAB — URINE DRUG SCREEN, QUALITATIVE (ARMC ONLY)
Amphetamines, Ur Screen: NOT DETECTED
Barbiturates, Ur Screen: NOT DETECTED
Benzodiazepine, Ur Scrn: POSITIVE — AB
Cannabinoid 50 Ng, Ur ~~LOC~~: POSITIVE — AB
Cocaine Metabolite,Ur ~~LOC~~: POSITIVE — AB
MDMA (Ecstasy)Ur Screen: NOT DETECTED
Methadone Scn, Ur: NOT DETECTED
Opiate, Ur Screen: NOT DETECTED
Phencyclidine (PCP) Ur S: NOT DETECTED
Tricyclic, Ur Screen: NOT DETECTED

## 2015-10-14 LAB — CBC
HCT: 30.2 % — ABNORMAL LOW (ref 35.0–47.0)
Hemoglobin: 9.4 g/dL — ABNORMAL LOW (ref 12.0–16.0)
MCH: 19.7 pg — ABNORMAL LOW (ref 26.0–34.0)
MCHC: 31.2 g/dL — ABNORMAL LOW (ref 32.0–36.0)
MCV: 63.2 fL — ABNORMAL LOW (ref 80.0–100.0)
Platelets: 501 10*3/uL — ABNORMAL HIGH (ref 150–440)
RBC: 4.77 MIL/uL (ref 3.80–5.20)
RDW: 21.2 % — ABNORMAL HIGH (ref 11.5–14.5)
WBC: 6.6 10*3/uL (ref 3.6–11.0)

## 2015-10-14 LAB — COMPREHENSIVE METABOLIC PANEL
ALT: 24 U/L (ref 14–54)
AST: 56 U/L — ABNORMAL HIGH (ref 15–41)
Albumin: 3.7 g/dL (ref 3.5–5.0)
Alkaline Phosphatase: 75 U/L (ref 38–126)
Anion gap: 11 (ref 5–15)
BUN: 7 mg/dL (ref 6–20)
CO2: 25 mmol/L (ref 22–32)
Calcium: 8.6 mg/dL — ABNORMAL LOW (ref 8.9–10.3)
Chloride: 105 mmol/L (ref 101–111)
Creatinine, Ser: 0.64 mg/dL (ref 0.44–1.00)
GFR calc Af Amer: >60 mL/min (ref >60)
GFR calc non Af Amer: >60 mL/min (ref >60)
Glucose, Bld: 100 mg/dL — ABNORMAL HIGH (ref 65–99)
Potassium: 3.8 mmol/L (ref 3.5–5.1)
Sodium: 141 mmol/L (ref 135–145)
Total Bilirubin: 0.2 mg/dL — ABNORMAL LOW (ref 0.3–1.2)
Total Protein: 8.4 g/dL — ABNORMAL HIGH (ref 6.5–8.1)

## 2015-10-14 LAB — ETHANOL: Alcohol, Ethyl (B): 392 mg/dL (ref <5)

## 2015-10-14 LAB — ACETAMINOPHEN LEVEL: Acetaminophen (Tylenol), Serum: <10 ug/mL — ABNORMAL LOW (ref 10–30)

## 2015-10-14 LAB — SALICYLATE LEVEL

## 2015-10-14 MED ORDER — HALOPERIDOL LACTATE 5 MG/ML IJ SOLN
INTRAMUSCULAR | Status: AC
  Administered 2015-10-14: 5 mg via INTRAMUSCULAR
  Filled 2015-10-14: qty 1

## 2015-10-14 MED ORDER — HALOPERIDOL LACTATE 5 MG/ML IJ SOLN
5.0000 mg | Freq: Once | INTRAMUSCULAR | Status: AC
  Administered 2015-10-14: 5 mg via INTRAMUSCULAR

## 2015-10-14 NOTE — ED Notes (Signed)
NAD noted at this time. Pt sitting up in bed and having a conversation with EDT at this time. Respirations even and unlabored at this time.

## 2015-10-14 NOTE — ED Provider Notes (Addendum)
-----------------------------------------   6:06 PM on 10/14/2015 -----------------------------------------  Dr. Guss [place] of psychiatry has evaluated the patient and lifted IVC. She recommends discharge with close follow-up with Dr. Suzie PortelaMoffitt as an outpatient. We'll DC with return precautions. The patient is clinically sober. She is mildly hypotensive but asymptomatic with no history of hypertension and this may be her baseline.  Gayla DossEryka A Pete Merten, MD 10/14/15 [phone]  Gayla DossEryka A Dorean Hiebert, MD 10/14/15 [phone]

## 2015-10-14 NOTE — ED Notes (Signed)

## 2015-10-14 NOTE — ED Notes (Signed)
NAD noted at this time. Pt noted to be asleep in the bed, snoring noted at this time. Pt awakens with mild stimuli.

## 2015-10-14 NOTE — ED Notes (Signed)
Patient refused lunch at this time. Patient didn't speak to this staff.

## 2015-10-14 NOTE — ED Notes (Signed)
Report given to Sonjia, RN 

## 2015-10-14 NOTE — ED Notes (Signed)
BEHAVIORAL HEALTH ROUNDING Patient sleeping: Yes.   Patient alert and oriented: yes Behavior appropriate: Yes.  ; If no, describe:  Nutrition and fluids offered: Yes  Toileting and hygiene offered: Yes  Sitter present: yes Law enforcement present: Yes ODS  

## 2015-10-14 NOTE — ED Notes (Signed)
BEHAVIORAL HEALTH ROUNDING  Patient sleeping: No.  Patient alert and oriented: yes  Behavior appropriate: Yes. ; If no, describe:  Nutrition and fluids offered: Yes  Toileting and hygiene offered: Yes  Sitter present: not applicable  Law enforcement present: Yes ODS  

## 2015-10-14 NOTE — ED Notes (Signed)
Patient arrives to ED this evening voluntarily.  Patient tearful.  States "I am going to kill myself"   States "I am going to run in front of a car"  Law enforcement state that patient has had several deaths of her family and friends.

## 2015-10-14 NOTE — ED Provider Notes (Signed)
[facility] Emergency Department Provider Note  ____________________________________________  Time seen: Approximately 2:42 AM  I have reviewed the triage vital signs and the nursing notes.   HISTORY  Chief Complaint Suicidal    HPI Kara Lawrence is a 39 y.o. female who comes into the emergency room intoxicated saying she wants to kill herself. She says she wants to run in front of a car. Patient apparently has had several deaths in her immediate family and cannot stand it anymore. Patient is also being very abusive to the nurses calling them names like bitch and cracker.   Past Medical History  Diagnosis Date  . Seizure (HCC)   . Bipolar 1 disorder (HCC)   . Manic depression (HCC)     Patient Active Problem List   Diagnosis Date Noted  . Severe recurrent major depression without psychotic features (HCC) 09/12/2015  . Alcohol use disorder, severe, dependence (HCC) 08/31/2015  . Alcohol withdrawal (HCC) 08/31/2015  . Stimulant use disorder (cocaine) 08/31/2015  . 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 Tubal ligation Bilateral     Current Outpatient Rx  Name  Route  Sig  Dispense  Refill  . citalopram (CELEXA) 40 MG tablet   Oral   Take 1 tablet (40 mg total) by mouth daily.   30 tablet   0   . divalproex (DEPAKOTE) 500 MG DR tablet   Oral   Take 1 tablet (500 mg total) by mouth 2 (two) times daily.   60 tablet   0   . traZODone (DESYREL) 150 MG tablet   Oral   Take 1 tablet (150 mg total) by mouth at bedtime.   30 tablet   0     Allergies Review of patient's allergies indicates no known allergies.  No family history on file.  Social History Social History  Substance Use Topics  . Smoking status: Never Smoker   . Smokeless tobacco: Never Used  . Alcohol Use: Yes     Comment: pt smells strongly of ETOH    Review of  Systems Constitutional: No fever/chills Eyes: No visual changes. ENT: No sore throat. Cardiovascular: Denies chest pain. Respiratory: Denies shortness of breath. Gastrointestinal: No abdominal pain.  No nausea, no vomiting.  No diarrhea.  No constipation. Genitourinary: Negative for dysuria.   10-point ROS otherwise negative.  ____________________________________________   PHYSICAL EXAM:  VITAL SIGNS: ED Triage Vitals  Enc Vitals Group     BP 10/14/15 0028 103/72 mmHg     Pulse Rate 10/14/15 0028 94     Resp 10/14/15 0028 16     Temp 10/14/15 0028 98 F (36.7 C)     Temp Source 10/14/15 0028 Oral     SpO2 10/14/15 0028 100 %     Weight 10/14/15 0028 125 lb (56.7 kg)     Height 10/14/15 0028  (1.6 m)     Head Cir --      Peak Flow --      Pain Score 10/14/15 0029 0     Pain Loc --      Pain Edu? --      Excl. in GC? --    After having received Haldol Constitutional: Alert and oriented. Well appearing and in no acute distress. Eyes: Conjunctivae are normal. PERRL. EOMI. Head: Atraumatic. Nose: No congestion/rhinnorhea. Mouth/Throat: Mucous membranes are moist.  Oropharynx non-erythematous. Neck: No stridor.  Cardiovascular: Normal  rate, regular rhythm. Grossly normal heart sounds.  Good peripheral circulation. Respiratory: Normal respiratory effort.  No retractions. Lungs CTAB. Gastrointestinal: Soft and nontender. No distention. No abdominal bruits. No CVA tenderness. Musculoskeletal: No lower extremity tenderness nor edema.  No joint effusions. Neurologic:  Normal speech and language. No gross focal neurologic deficits are appreciated.  .   ____________________________________________   LABS (all labs ordered are listed, but only abnormal results are displayed)  Labs Reviewed  COMPREHENSIVE METABOLIC PANEL - Abnormal; Notable for the following:    Glucose, Bld 100 (*)    Calcium 8.6 (*)    Total Protein 8.4 (*)    AST 56 (*)    Total Bilirubin 0.2  (*)    All other components within normal limits  ETHANOL - Abnormal; Notable for the following:    Alcohol, Ethyl (B) 392 (*)    All other components within normal limits  ACETAMINOPHEN LEVEL - Abnormal; Notable for the following:    Acetaminophen (Tylenol), Serum <10 (*)    All other components within normal limits  CBC - Abnormal; Notable for the following:    Hemoglobin 9.4 (*)    HCT 30.2 (*)    MCV 63.2 (*)    MCH 19.7 (*)    MCHC 31.2 (*)    RDW 21.2 (*)    Platelets 501 (*)    All other components within normal limits  URINE DRUG SCREEN, QUALITATIVE ([facility] ONLY) - Abnormal; Notable for the following:    Cocaine Metabolite,Ur [place] POSITIVE (*)    Cannabinoid 50 Ng, Ur [place] POSITIVE (*)    Benzodiazepine, Ur Scrn POSITIVE (*)    All other components within normal limits  SALICYLATE LEVEL   ____________________________________________  EKG  ____________________________________________  RADIOLOGY   ____________________________________________   PROCEDURES    ____________________________________________   INITIAL IMPRESSION / ASSESSMENT AND PLAN / ED COURSE  Pertinent labs & imaging results that were available during my care of the patient were reviewed by me and considered in my medical decision making (see chart for details).   ____________________________________________   FINAL CLINICAL IMPRESSION(S) / ED DIAGNOSES  Final diagnoses:  Depressed      Arnaldo NatalPaul F Malinda, MD 10/14/15 [phone]

## 2015-10-14 NOTE — ED Notes (Signed)
BEHAVIORAL HEALTH ROUNDING Patient sleeping: Yes.   Patient alert and oriented: not applicable SLEEPING Behavior appropriate: Yes.  ; If no, describe: SLEEPING Nutrition and fluids offered: No SLEEPING Toileting and hygiene offered: NoSLEEPING Sitter present: not applicable Law enforcement present: Yes ODS 

## 2015-10-14 NOTE — ED Notes (Addendum)
Pt cursing, spitting in the floor. Upset that she is in a hall bed and not a room. Explained to the patient that she would be put in a room as soon as one was available. Pt continues to curse at staff and be loud. Dr Darnelle CatalanMalinda aware.

## 2015-10-14 NOTE — Progress Notes (Signed)
Pt not seen as of yet. Pt presents a incoherent due to elevated alcohol levels, agitation, pt continues to be lethargic. TTS will assess when pt is appropriate.   10/14/2015 Cheryl FlashNicole Arella Blinder, MS, NCC, LPCA Therapeutic Triage Specialist

## 2015-10-14 NOTE — Consult Note (Signed)
[place] Psychiatry Consult   Reason for Consult:  Follow up Referring Physician:  Er Patient Identification: Kara Lawrence MRN:  [id number] Principal Diagnosis: <principal problem not specified> Diagnosis:   Patient Active Problem List   Diagnosis Date Noted  . Severe recurrent major depression without psychotic features ([place]) [F33.2] 09/12/2015  . Alcohol use disorder, severe, dependence ([place]) [F10.20] 08/31/2015  . Alcohol withdrawal ([place]) [F10.239] 08/31/2015  . Stimulant use disorder (cocaine) [F15.90] 08/31/2015  . 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 female patient admitted with a long H/OBJECTIVE: depression and is being followed at [facility].. \  HPI:  Last night she had arguments with her boy friend of 9 yrs and started having suicidal thoughts.  Past Psychiatric History: Had many Inpt to psychiatry with multiple suicide ideas . Has apt coming up with Dr. Ernie Hew in few days and has enough meds at home.  Risk to Self: Is patient at risk for suicide?: Yes Risk to Others:   Prior Inpatient Therapy:   Prior Outpatient Therapy:    Past Medical History:  Past Medical History  Diagnosis Date  . Seizure ([place])   . Bipolar 1 disorder ([place])   . Manic depression ([place])     Past Surgical History  Procedure Laterality Date  . Tubal ligation Bilateral    Family History: No family history on file. Family Psychiatric  History: none Social History:  History  Alcohol Use  . Yes    Comment: pt smells strongly of ETOH     History  Drug Use  . Yes  . Special: "Crack" cocaine, Benzodiazepines    Social History   Social History  . Marital Status: Single    Spouse Name: N/A  . Number of Children: N/A  . Years of Education: N/A   Social History Main Topics  . Smoking status: Never Smoker   .  Smokeless tobacco: Never Used  . Alcohol Use: Yes     Comment: pt smells strongly of ETOH  . Drug Use: Yes    Special: "Crack" cocaine, Benzodiazepines  . Sexual Activity: Yes    Birth Control/ Protection: None   Other Topics Concern  . None   Social History Narrative   Additional Social History:                          Allergies:  No Known Allergies  Labs:  Results for orders placed or performed during the hospital encounter of 10/14/15 (from the past 48 hour(s))  Comprehensive metabolic panel     Status: Abnormal   Collection Time: 10/14/15 12:31 AM  Result Value Ref Range   Sodium 141 135 - 145 mmol/L   Potassium 3.8 3.5 - 5.1 mmol/L   Chloride 105 101 - 111 mmol/L   CO2 25 22 - 32 mmol/L   Glucose, Bld 100 (H) 65 - 99 mg/dL   BUN 7 6 - 20 mg/dL   Creatinine, Ser 0.64 0.44 - 1.00 mg/dL   Calcium 8.6 (L) 8.9 - 10.3 mg/dL   Total Protein 8.4 (H) 6.5 - 8.1 g/dL   Albumin 3.7 3.5 - 5.0 g/dL   AST 56 (H) 15 - 41 U/L   ALT 24 14 - 54 U/L   Alkaline Phosphatase 75 38 - 126 U/L   Total Bilirubin 0.2 (L)  0.3 - 1.2 mg/dL   GFR calc non Af Amer >60 >60 mL/min   GFR calc Af Amer >60 >60 mL/min    Comment: (NOTE) The eGFR has been calculated using the CKD EPI equation. This calculation has not been validated in all clinical situations. eGFR's persistently <60 mL/min signify possible Chronic Kidney Disease.    Anion gap 11 5 - 15  Ethanol (ETOH)     Status: Abnormal   Collection Time: 10/14/15 12:31 AM  Result Value Ref Range   Alcohol, Ethyl (B) 392 (HH) <5 mg/dL    Comment: CRITICAL RESULT CALLED TO, READ BACK BY AND VERIFIED WITH DAWN TULLOCH AT 0152 ON 10/14/15 RWW        LOWEST DETECTABLE LIMIT FOR SERUM ALCOHOL IS 5 mg/dL FOR MEDICAL PURPOSES ONLY   Salicylate level     Status: None   Collection Time: 10/14/15 12:31 AM  Result Value Ref Range   Salicylate Lvl <2.7 2.8 - 30.0 mg/dL  Acetaminophen level     Status: Abnormal   Collection Time: 10/14/15  12:31 AM  Result Value Ref Range   Acetaminophen (Tylenol), Serum <10 (L) 10 - 30 ug/mL    Comment:        THERAPEUTIC CONCENTRATIONS VARY SIGNIFICANTLY. A RANGE OF 10-30 ug/mL MAY BE AN EFFECTIVE CONCENTRATION FOR MANY PATIENTS. HOWEVER, SOME ARE BEST TREATED AT CONCENTRATIONS OUTSIDE THIS RANGE. ACETAMINOPHEN CONCENTRATIONS >150 ug/mL AT 4 HOURS AFTER INGESTION AND >50 ug/mL AT 12 HOURS AFTER INGESTION ARE OFTEN ASSOCIATED WITH TOXIC REACTIONS.   CBC     Status: Abnormal   Collection Time: 10/14/15 12:31 AM  Result Value Ref Range   WBC 6.6 3.6 - 11.0 K/uL   RBC 4.77 3.80 - 5.20 MIL/uL   Hemoglobin 9.4 (L) 12.0 - 16.0 g/dL   HCT 30.2 (L) 35.0 - 47.0 %   MCV 63.2 (L) 80.0 - 100.0 fL   MCH 19.7 (L) 26.0 - 34.0 pg   MCHC 31.2 (L) 32.0 - 36.0 g/dL   RDW 21.2 (H) 11.5 - 14.5 %   Platelets 501 (H) 150 - 440 K/uL  Urine Drug Screen, Qualitative ([facility] only)     Status: Abnormal   Collection Time: 10/14/15 12:31 AM  Result Value Ref Range   Tricyclic, Ur Screen NONE DETECTED NONE DETECTED   Amphetamines, Ur Screen NONE DETECTED NONE DETECTED   MDMA (Ecstasy)Ur Screen NONE DETECTED NONE DETECTED   Cocaine Metabolite,Ur [place] POSITIVE (A) NONE DETECTED   Opiate, Ur Screen NONE DETECTED NONE DETECTED   Phencyclidine (PCP) Ur S NONE DETECTED NONE DETECTED   Cannabinoid 50 Ng, Ur [place] POSITIVE (A) NONE DETECTED   Barbiturates, Ur Screen NONE DETECTED NONE DETECTED   Benzodiazepine, Ur Scrn POSITIVE (A) NONE DETECTED   Methadone Scn, Ur NONE DETECTED NONE DETECTED    Comment: (NOTE) 782  Tricyclics, urine               Cutoff 1000 ng/mL 200  Amphetamines, urine             Cutoff 1000 ng/mL 300  MDMA (Ecstasy), urine           Cutoff 500 ng/mL 400  Cocaine Metabolite, urine       Cutoff 300 ng/mL 500  Opiate, urine                   Cutoff 300 ng/mL 600  Phencyclidine (PCP), urine      Cutoff 25 ng/mL 700  Cannabinoid, urine  Cutoff 50 ng/mL 800  Barbiturates, urine              Cutoff 200 ng/mL 900  Benzodiazepine, urine           Cutoff 200 ng/mL 1000 Methadone, urine                Cutoff 300 ng/mL 1100 1200 The urine drug screen provides only a preliminary, unconfirmed 1300 analytical test result and should not be used for non-medical 1400 purposes. Clinical consideration and professional judgment should 1500 be applied to any positive drug screen result due to possible 1600 interfering substances. A more specific alternate chemical method 1700 must be used in order to obtain a confirmed analytical result.  1800 Gas chromato graphy / mass spectrometry (GC/MS) is the preferred 1900 confirmatory method.     No current facility-administered medications for this encounter.   Current Outpatient Prescriptions  Medication Sig Dispense Refill  . citalopram (CELEXA) 40 MG tablet Take 1 tablet (40 mg total) by mouth daily. 30 tablet 0  . divalproex (DEPAKOTE) 500 MG DR tablet Take 1 tablet (500 mg total) by mouth 2 (two) times daily. 60 tablet 0  . traZODone (DESYREL) 150 MG tablet Take 1 tablet (150 mg total) by mouth at bedtime. 30 tablet 0    Musculoskeletal: Strength & Muscle Tone: within normal limits Gait & Station: normal Patient leans: N/A  Psychiatric Specialty Exam: Review of Systems  All other systems reviewed and are negative.   Blood pressure 95/68, pulse 95, temperature 98.4 F (36.9 C), temperature source Oral, resp. rate 18, height 5' 3"  (1.6 m), weight 125 lb (56.7 kg), last menstrual period 09/17/2015, SpO2 99 %.Body mass index is 22.15 kg/(m^2).  General Appearance: Casual  Eye Contact::  Fair  Speech:  Clear and Coherent  Volume:  Normal  Mood:  Anxious  Affect:  Appropriate  Thought Process:  Circumstantial  Orientation:  Full (Time, Place, and Person)  Thought Content:  NA  Suicidal Thoughts:  No  Homicidal Thoughts:  No  Memory:  Immediate;   Fair Recent;   Fair Remote;   Fair adequate.  Judgement:  Fair  Insight:   Fair  Psychomotor Activity:  Normal  Concentration:  Fair  Recall:  [employer] of Knowledge:Fair  Language: Fair  Akathisia:  No  Handed:  Right  AIMS (if indicated):     Assets:  Communication Skills Desire for [place] Talents/Skills  ADL's:  Intact  Cognition: WNL  Sleep:      Treatment Plan Summary: Plan D/C IVC and discharge pt home and will keep her follow up apt with Dr.Moffit.  Disposition: No evidence of imminent risk to self or others at present.    Dewain Penning 10/14/2015 4:51 PM

## 2015-10-14 NOTE — ED Provider Notes (Signed)
Patient was not seen by a PA ----------------------------------------- 8:05 AM on 10/14/2015 -----------------------------------------   Blood pressure 103/72, pulse 94, temperature 98 F (36.7 C), temperature source Oral, resp. rate 16, height 5\' 3"  (1.6 m), weight 125 lb (56.7 kg), last menstrual period 09/17/2015, SpO2 100 %.  The patient had no acute events since last update.  Calm and cooperative at this time.  Disposition is pending per Psychiatry/Behavioral Medicine team recommendations.     Arnaldo NatalPaul F Conception Doebler, MD 10/14/15 [phone]

## 2015-10-14 NOTE — ED Notes (Signed)
Patient assigned to appropriate care area. Patient oriented to unit/care area: Informed that, for their safety, care areas are designed for safety and monitored by security cameras at all times; and visiting hours explained to patient. Patient verbalizes understanding, and verbal contract for safety obtained. 

## 2015-10-14 NOTE — ED Notes (Signed)
NAD noted at this time. Pt resting in bed bed with eyes closed. Respirations noted to be even and unlabored. Awakens with mild stimuli.

## 2015-10-14 NOTE — ED Notes (Signed)
BEHAVIORAL HEALTH ROUNDING  Patient sleeping: No.  Patient alert and oriented: yes  Behavior appropriate: No. ; If no, describe: loud, belligerent Nutrition and fluids offered: Yes  Toileting and hygiene offered: Yes  Sitter present: not applicable  Law enforcement present: Yes ODS

## 2015-10-14 NOTE — ED Provider Notes (Signed)
Medical screening examination/treatment/procedure(s) were performed by non-physician practitioner and as supervising physician I was immediately available for consultation/collaboration.    Arnaldo NatalPaul F Malinda, MD 10/14/15 [phone]

## 2015-10-14 NOTE — ED Notes (Signed)
ED BHU PLACEMENT JUSTIFICATION Is the patient under IVC or is there intent for IVC: No. Is the patient medically cleared: Yes.   Is there vacancy in the ED BHU: Yes.   Is the population mix appropriate for patient: Yes.   Is the patient awaiting placement in inpatient or outpatient setting: No. Has the patient had a psychiatric consult: No. Survey of unit performed for contraband, proper placement and condition of furniture, tampering with fixtures in bathroom, shower, and each patient room: Yes.  ; Findings: Unremarkable APPEARANCE/BEHAVIOR calm and cooperative NEURO ASSESSMENT Orientation: time, place and person Hallucinations: No.None noted (Hallucinations) Speech: Normal Gait: normal RESPIRATORY ASSESSMENT Normal expansion.  Clear to auscultation.  No rales, rhonchi, or wheezing. CARDIOVASCULAR ASSESSMENT regular rate and rhythm, S1, S2 normal, no murmur, click, rub or gallop GASTROINTESTINAL ASSESSMENT soft, nontender, BS WNL, no r/g EXTREMITIES normal strength, tone, and muscle mass PLAN OF CARE Provide calm/safe environment. Vital signs assessed twice daily. ED BHU Assessment once each 12-hour shift. Collaborate with intake RN daily or as condition indicates. Assure the ED provider has rounded once each shift. Provide and encourage hygiene. Provide redirection as needed. Assess for escalating behavior; address immediately and inform ED provider.  Assess family dynamic and appropriateness for visitation as needed: Yes.  ; If necessary, describe findings:  Educate the patient/family about BHU procedures/visitation: Yes.  ; If necessary, describe findings:

## 2015-10-24 ENCOUNTER — Encounter: Payer: Self-pay | Admitting: *Deleted

## 2015-10-24 ENCOUNTER — Ambulatory Visit (HOSPITAL_COMMUNITY)
Admission: AD | Admit: 2015-10-24 | Discharge: 2015-10-24 | Disposition: A | Discharge status: Home / Self Care | Payer: Self-pay | Source: Other Acute Inpatient Hospital | Attending: Emergency Medicine | Admitting: Emergency Medicine

## 2015-10-24 ENCOUNTER — Emergency Department: Payer: Self-pay

## 2015-10-24 ENCOUNTER — Emergency Department
Admission: EM | Admit: 2015-10-24 | Discharge: 2015-10-24 | Disposition: A | Discharge status: Other Acute Inpatient Hospital | Payer: Self-pay | Attending: Emergency Medicine | Admitting: Emergency Medicine

## 2015-10-24 DIAGNOSIS — S36119A Unspecified injury of liver, initial encounter: Secondary | ICD-10-CM

## 2015-10-24 DIAGNOSIS — I10 Essential (primary) hypertension: Secondary | ICD-10-CM | POA: Insufficient documentation

## 2015-10-24 DIAGNOSIS — K922 Gastrointestinal hemorrhage, unspecified: Secondary | ICD-10-CM | POA: Insufficient documentation

## 2015-10-24 DIAGNOSIS — Z79899 Other long term (current) drug therapy: Secondary | ICD-10-CM | POA: Insufficient documentation

## 2015-10-24 DIAGNOSIS — K7290 Hepatic failure, unspecified without coma: Secondary | ICD-10-CM | POA: Insufficient documentation

## 2015-10-24 DIAGNOSIS — R1011 Right upper quadrant pain: Secondary | ICD-10-CM

## 2015-10-24 HISTORY — DX: Unspecified injury of liver, initial encounter: S36.119A

## 2015-10-24 LAB — AMMONIA: Ammonia: 49 umol/L — ABNORMAL HIGH (ref 9–35)

## 2015-10-24 LAB — CBC WITH DIFFERENTIAL/PLATELET
Basophils Absolute: 0 10*3/uL (ref 0–0.1)
Basophils Relative: 0 %
EOS ABS: 0.2 10*3/uL (ref 0–0.7)
EOS PCT: 1 %
HCT: 28.2 % — ABNORMAL LOW (ref 35.0–47.0)
Hemoglobin: 8.4 g/dL — ABNORMAL LOW (ref 12.0–16.0)
LYMPHS ABS: 0.6 10*3/uL — AB (ref 1.0–3.6)
LYMPHS PCT: 3 %
MCH: 19.8 pg — AB (ref 26.0–34.0)
MCHC: 30 g/dL — ABNORMAL LOW (ref 32.0–36.0)
MCV: 66.1 fL — ABNORMAL LOW (ref 80.0–100.0)
MONO ABS: 0.9 10*3/uL (ref 0.2–0.9)
MONOS PCT: 5 %
Neutro Abs: 17 10*3/uL — ABNORMAL HIGH (ref 1.4–6.5)
Neutrophils Relative %: 91 %
PLATELETS: 209 10*3/uL (ref 150–440)
RBC: 4.26 MIL/uL (ref 3.80–5.20)
RDW: 21.8 % — ABNORMAL HIGH (ref 11.5–14.5)
WBC: 18.8 10*3/uL — ABNORMAL HIGH (ref 3.6–11.0)

## 2015-10-24 LAB — HEMOGLOBIN AND HEMATOCRIT, BLOOD
HEMATOCRIT: 25.8 % — AB (ref 35.0–47.0)
Hemoglobin: 7.8 g/dL — ABNORMAL LOW (ref 12.0–16.0)

## 2015-10-24 LAB — GLUCOSE, CAPILLARY
GLUCOSE-CAPILLARY: 145 mg/dL — AB (ref 65–99)
GLUCOSE-CAPILLARY: 108 mg/dL — AB (ref 65–99)
Glucose-Capillary: 136 mg/dL — ABNORMAL HIGH (ref 65–99)

## 2015-10-24 LAB — COMPREHENSIVE METABOLIC PANEL
ALT: 2638 U/L — AB (ref 14–54)
ANION GAP: 28 — AB (ref 5–15)
AST: >2275 U/L — ABNORMAL HIGH (ref 15–41)
Albumin: 3.1 g/dL — ABNORMAL LOW (ref 3.5–5.0)
Alkaline Phosphatase: 124 U/L (ref 38–126)
BUN: 16 mg/dL (ref 6–20)
CHLORIDE: 84 mmol/L — AB (ref 101–111)
CO2: 20 mmol/L — AB (ref 22–32)
CREATININE: 3.9 mg/dL — AB (ref 0.44–1.00)
Calcium: 6.1 mg/dL — CL (ref 8.9–10.3)
GFR calc non Af Amer: 13 mL/min — ABNORMAL LOW (ref >60)
GFR, EST AFRICAN AMERICAN: 15 mL/min — AB (ref >60)
Glucose, Bld: 24 mg/dL — CL (ref 65–99)
Potassium: 5 mmol/L (ref 3.5–5.1)
SODIUM: 132 mmol/L — AB (ref 135–145)
Total Bilirubin: 6.2 mg/dL — ABNORMAL HIGH (ref 0.3–1.2)
Total Protein: 6.4 g/dL — ABNORMAL LOW (ref 6.5–8.1)

## 2015-10-24 LAB — PREPARE RBC (CROSSMATCH)

## 2015-10-24 LAB — SALICYLATE LEVEL: Salicylate Lvl: 7.1 mg/dL (ref 2.8–30.0)

## 2015-10-24 LAB — ABO/RH: ABO/RH(D): O POS

## 2015-10-24 LAB — LIPASE, BLOOD: Lipase: 253 U/L — ABNORMAL HIGH (ref 11–51)

## 2015-10-24 LAB — ACETAMINOPHEN LEVEL

## 2015-10-24 LAB — ETHANOL: ALCOHOL ETHYL (B): 17 mg/dL — AB (ref <5)

## 2015-10-24 MED ORDER — HYDROMORPHONE HCL 1 MG/ML IJ SOLN
1.0000 mg | Freq: Once | INTRAMUSCULAR | Status: AC
  Administered 2015-10-24: 1 mg via INTRAVENOUS
  Filled 2015-10-24: qty 1

## 2015-10-24 MED ORDER — PANTOPRAZOLE SODIUM 40 MG IV SOLR
40.0000 mg | Freq: Once | INTRAVENOUS | Status: AC
Start: 2015-10-24 — End: 2015-10-24
  Administered 2015-10-24: 40 mg via INTRAVENOUS
  Filled 2015-10-24: qty 40

## 2015-10-24 MED ORDER — DEXTROSE 50 % IV SOLN
INTRAVENOUS | Status: AC
  Administered 2015-10-24: 50 mL via INTRAVENOUS
  Filled 2015-10-24: qty 50

## 2015-10-24 MED ORDER — SODIUM CHLORIDE 0.9 % IV BOLUS (SEPSIS)
500.0000 mL | Freq: Once | INTRAVENOUS | Status: AC
Start: 2015-10-24 — End: 2015-10-24
  Administered 2015-10-24: 500 mL via INTRAVENOUS

## 2015-10-24 MED ORDER — SODIUM CHLORIDE 0.9 % IV BOLUS (SEPSIS)
500.0000 mL | Freq: Once | INTRAVENOUS | Status: AC
  Administered 2015-10-24: 500 mL via INTRAVENOUS

## 2015-10-24 MED ORDER — ACETYLCYSTEINE LOAD VIA INFUSION
150.0000 mg/kg | Freq: Once | INTRAVENOUS | Status: AC
  Administered 2015-10-24: 6885 mg via INTRAVENOUS
  Filled 2015-10-24: qty 173

## 2015-10-24 MED ORDER — DEXTROSE 50 % IV SOLN
50.0000 mL | Freq: Once | INTRAVENOUS | Status: AC
  Administered 2015-10-24: 50 mL via INTRAVENOUS

## 2015-10-24 MED ORDER — ONDANSETRON HCL 4 MG/2ML IJ SOLN
4.0000 mg | Freq: Once | INTRAMUSCULAR | Status: AC
  Administered 2015-10-24: 4 mg via INTRAVENOUS
  Filled 2015-10-24: qty 2

## 2015-10-24 MED ORDER — SODIUM CHLORIDE 0.9 % IV BOLUS (SEPSIS)
1000.0000 mL | Freq: Once | INTRAVENOUS | Status: AC
  Administered 2015-10-24: 1000 mL via INTRAVENOUS

## 2015-10-24 MED ORDER — DEXTROSE 5 % IV SOLN
15.0000 mg/kg/h | INTRAVENOUS | Status: DC
  Administered 2015-10-24: 15 mg/kg/h via INTRAVENOUS
  Filled 2015-10-24: qty 150

## 2015-10-24 MED ORDER — SODIUM CHLORIDE 0.9 % IV SOLN
10.0000 mL/h | Freq: Once | INTRAVENOUS | Status: AC
  Administered 2015-10-24: 10 mL/h via INTRAVENOUS

## 2015-10-24 NOTE — ED Notes (Signed)
[facility] left with patient 

## 2015-10-24 NOTE — ED Notes (Signed)
Patient is easily awakened by voice, but is unable to maintain alertness, mumbles answers to questions.

## 2015-10-24 NOTE — ED Notes (Signed)
Patient ambulated to and from bathroom with a slightly unsteady gait. No vomiting has been noted since Zofran was given.

## 2015-10-24 NOTE — ED Notes (Signed)
Per EMS report, patient has been vomiting for one week and now is vomiting coffee-ground emesis. Patient is not cooperative with questions.

## 2015-10-24 NOTE — ED Notes (Signed)
Patient left for US

## 2015-10-24 NOTE — ED Provider Notes (Signed)
-----------------------------------------   5:03 PM on 10/24/2015 -----------------------------------------  Assumed care patient from Dr. Huel CoteQuigley. Patient does have elevated liver enzymes. She does admit to some Tylenol use. She unfortunately is unable to give a precise amount of Tylenol or how long she has been using the Tylenol.  Although I have not received the Tylenol level will go ahead and start treating with N-acetylcysteine. At this point I feel like any risk of the N-acetylcysteine is greatly outweighed by possible benefit. We will work on getting patient transferred to outside facility.  ----------------------------------------- 6:42 PM on 10/24/2015 -----------------------------------------  Was able to discuss the patient with [facility] who has accepted the patient for transfer.    The patient's blood pressure continued to be low. I rechecked the hemoglobin which showed some slight decreased. Because of this I did ask that the patient receive blood. The Tylenol level did come back and was less than 10. This point I do not have a clear etiology of the liver failure and given the patient stated use of Tylenol will continue with the N-acetylcysteine. In addition the patient's ammonia was found to be elevated.  CRITICAL CARE Performed by: Phineas SemenGOODMAN, Nikolina Simerson   Total critical care time: 30 minutes  Critical care time was exclusive of separately billable procedures and treating other patients.  Critical care was necessary to treat or prevent imminent or life-threatening deterioration.  Critical care was time spent personally by me on the following activities: development of treatment plan with patient and/or surrogate as well as nursing, discussions with consultants, evaluation of patient's response to treatment, examination of patient, obtaining history from patient or surrogate, ordering and performing treatments and interventions, ordering and review of laboratory  studies, ordering and review of radiographic studies, pulse oximetry and re-evaluation of patient's condition.   Phineas SemenGraydon Teal Raben, MD 10/24/15 2233

## 2015-10-24 NOTE — ED Notes (Signed)
Patient is back from ultra sound.

## 2015-10-24 NOTE — ED Notes (Signed)
Two attempts made to obtain 2nd IV site. Both were unsuccessful. Will defer to another RN.

## 2015-10-24 NOTE — ED Notes (Signed)
Patient ambulated to and from bathroom with an unsteady gait.

## 2015-10-24 NOTE — ED Notes (Signed)
Blood infusing, Acetylcysteine infusing  [facility]. All were transferred to [facility]'s pumps.

## 2015-10-24 NOTE — Progress Notes (Signed)
MEDICATION RELATED CONSULT NOTE - INITIAL   Pharmacy Consult for NAC  Indication: Possible APAP overdose/Liver Toxicity  No Known Allergies  Patient Measurements: Height: 5\' 3"  (160 cm) Weight: 101 lb 3.1 oz (45.9 kg) IBW/kg (Calculated) : 52.4  Vital Signs: Temp: 97.4 F (36.3 C) (11/09 1146) Temp Source: Oral (11/09 1146) BP: 92/52 mmHg (11/09 1805) Pulse Rate: 106 (11/09 1805) Intake/Output from previous day:    Labs:  Recent Labs  10/24/15 1235  WBC 18.8*  HGB 8.4*  HCT 28.2*  PLT 209  CREATININE 3.90*  ALBUMIN 3.1*  PROT 6.4*  AST >2275*  ALT 2638*  ALKPHOS 124  BILITOT 6.2*   Estimated Creatinine Clearance: 13.8 mL/min (by C-G formula based on Cr of 3.9).   Microbiology: No results found for this or any previous visit (from the past 720 hour(s)).  Medical History: Past Medical History  Diagnosis Date  . Seizure ([facility])   . Bipolar 1 disorder ([facility])   . Manic depression ([facility])     Medications:  Scheduled:  . acetylcysteine  150 mg/kg Intravenous Once    Assessment: Kara Lawrence is a 39 yo female admitted for epigastric pain and confusion. Pharmacy consulted to dose NAC for possible APAP toxicity/liver toxicity.  AST: >2275 ALT: 2638  Plan:  [facility] with load of 150 mg/kg (6885mg ) and then give 15 mg/kg/hr (17.21 ml/hr) for at least 24 hours.   Poison control also recommends getting repeat salicylate and APAP levels, INR, valproic acid level, as well as giving fluids.   Pharmacy will continue to monitor.  Cy Blamerllison K Lillian Ballester 10/24/2015,6:23 PM

## 2015-10-24 NOTE — ED Notes (Signed)
Lab called for a redraw on red tube. 2nd RN at bedside attempting second IV insertion and blood draw.

## 2015-10-24 NOTE — ED Notes (Signed)
Positive guaiac from stool sample obtained by Dr. Huel CoteQuigley.

## 2015-10-24 NOTE — ED Notes (Signed)
[facility] at bedside 

## 2015-10-24 NOTE — ED Provider Notes (Signed)
Time Seen: Approximately 1131 I have reviewed the triage notes  Chief Complaint: Emesis   History of Present Illness: Kara Lawrence is a 39 y.o. female who is an extremely poor historian who states she started developing some epigastric pain with persistent nausea and vomiting "" one week ago "". The patient denies any toxic ingestions other than alcohol. Patient arrives somewhat confused and again is reluctant to answer any questions or interaction. Patient was found to be hypoglycemic and was given an amp of D50 and had some symptomatic improvement as far as answering questions. She denies any suicidal thoughts, homicidal thoughts, hallucinations. She describes epigastric pain according to EMS there was some coffee-ground emesis at home. Patient has a history of alcohol abuse.   Past Medical History  Diagnosis Date  . Seizure (HCC)   . Bipolar 1 disorder (HCC)   . Manic depression (HCC)     Patient Active Problem List   Diagnosis Date Noted  . Severe recurrent major depression without psychotic features (HCC) 09/12/2015  . Alcohol use disorder, severe, dependence (HCC) 08/31/2015  . Alcohol withdrawal (HCC) 08/31/2015  . Stimulant use disorder (cocaine) 08/31/2015  . 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 Tubal ligation Bilateral     Past Surgical History  Procedure Laterality Date  . Tubal ligation Bilateral     Current Outpatient Rx  Name  Route  Sig  Dispense  Refill  . citalopram (CELEXA) 40 MG tablet   Oral   Take 40 mg by mouth daily.         . divalproex (DEPAKOTE ER) 500 MG 24 hr tablet   Oral   Take 500 mg by mouth 2 (two) times daily.         . traZODone (DESYREL) 150 MG tablet   Oral   Take 150 mg by mouth at bedtime.           Allergies:  Review of patient's allergies indicates no known allergies.  Family History: No family history on  file.  Social History: Social History  Substance Use Topics  . Smoking status: Never Smoker   . Smokeless tobacco: Never Used  . Alcohol Use: Yes     Comment: pt smells strongly of ETOH     Review of Systems:   10 point review of systems was performed and was otherwise negative:  Constitutional: No fever Eyes: No visual disturbances ENT: No sore throat, ear pain Cardiac: No chest pain Respiratory: No shortness of breath, wheezing, or stridor Abdomen: No abdominal pain, no vomiting, No diarrhea Endocrine: No weight loss, No night sweats Extremities: No peripheral edema, cyanosis Skin: No rashes, easy bruising Neurologic: No focal weakness, trouble with speech or swollowing Urologic: No dysuria, Hematuria, or urinary frequency   Physical Exam:  ED Triage Vitals  Enc Vitals Group     BP 10/24/15 1146 76/45 mmHg     Pulse Rate 10/24/15 1146 97     Resp 10/24/15 1146 18     Temp 10/24/15 1146 97.4 F (36.3 C)     Temp Source 10/24/15 1146 Oral     SpO2 10/24/15 1146 100 %     Weight 10/24/15 1146 101 lb 3.1 oz (45.9 kg)     Height 10/24/15 1146 5\' 3"  (1.6 m)     Head Cir --      Peak Flow --      Pain Score 10/24/15  1148 7     Pain Loc --      Pain Edu? --      Excl. in GC? --     General: Awake , Alert , and Oriented times 3; GCS 15 Head: Normal cephalic , atraumatic Eyes: Pupils equal , round, reactive to light. Patient has jaundice Nose/Throat: No nasal drainage, patent upper airway without erythema or exudate.  Neck: Supple, Full range of motion, No anterior adenopathy or palpable thyroid masses Lungs: Clear to ascultation without wheezes , rhonchi, or rales Heart: Tachycardic regular rhythm without murmurs , gallops , or rubs Abdomen: Tender primarily in the epigastric area without rebound, guarding , or rigidity; bowel sounds positive and symmetric in all 4 quadrants. No organomegaly .        Extremities: 2 plus symmetric pulses. No edema, clubbing or  cyanosis Neurologic: normal ambulation, Motor symmetric without deficits, sensory intact Skin: warm, dry, no rashes Rectal exam with chaperone present was guaiac positive with little stool in the rectal vault. Normal sphincter tone and no palpable masses were noted  Labs:   All laboratory work was reviewed including any pertinent negatives or positives listed below:  Labs Reviewed  CBC WITH DIFFERENTIAL/PLATELET - Abnormal; Notable for the following:    WBC 18.8 (*)    Hemoglobin 8.4 (*)    HCT 28.2 (*)    MCV 66.1 (*)    MCH 19.8 (*)    MCHC 30.0 (*)    RDW 21.8 (*)    Neutro Abs 17.0 (*)    Lymphs Abs 0.6 (*)    All other components within normal limits  COMPREHENSIVE METABOLIC PANEL - Abnormal; Notable for the following:    Sodium 132 (*)    Chloride 84 (*)    CO2 20 (*)    Glucose, Bld 24 (*)    Creatinine, Ser 3.90 (*)    Calcium 6.1 (*)    Total Protein 6.4 (*)    Albumin 3.1 (*)    AST >2275 (*)    ALT 2638 (*)    Total Bilirubin 6.2 (*)    GFR calc non Af Amer 13 (*)    GFR calc Af Amer 15 (*)    Anion gap 28 (*)    All other components within normal limits  LIPASE, BLOOD - Abnormal; Notable for the following:    Lipase 253 (*)    All other components within normal limits  GLUCOSE, CAPILLARY - Abnormal; Notable for the following:    Glucose-Capillary 145 (*)    All other components within normal limits  HEPATITIS PANEL, ACUTE  ETHANOL  ACETAMINOPHEN LEVEL  SALICYLATE LEVEL  TYPE AND SCREEN   Laboratory work shows numerous abnormalities with an elevated creatinine level along with significantly elevated liver panel and total bilirubin. EKG: ED ECG REPORT I, Jennye Moccasin, the attending physician, personally viewed and interpreted this ECG.  Date: 10/24/2015 EKG Time: 1151 Rate: 99 Rhythm: normal sinus rhythm QRS Axis: normal Intervals: Right bundle-branch block ST/T Wave abnormalities: normal Conduction Disutrbances: none Narrative Interpretation:  unremarkable   Radiology:    EXAM: US ABDOMEN LIMITED - RIGHT UPPER QUADRANT  COMPARISON: None.  FINDINGS: Gallbladder:  Gallbladder appears completely contracted, precluding diagnostic gallbladder evaluation. No appreciable gallstones. No definite gallbladder wall thickening. No pericholecystic fluid. No sonographic Murphy sign.  Common bile duct:  Diameter: 3 mm  Liver:  Liver parenchyma is diffusely mildly echogenic with mild posterior acoustic attenuation, in keeping with mild diffuse hepatic steatosis. No  liver surface irregularity is demonstrated. No liver mass is detected, noting decreased sensitivity in the setting of an echogenic liver.  IMPRESSION: 1. Completely contracted gallbladder, precluding diagnostic gallbladder evaluation. No appreciable gallstones, gallbladder wall thickening, pericholecystic fluid or sonographic Murphy sign. If the patient is in an NPO state, the contracted gallbladder is abnormal and suggestive of biliary dyskinesia. Consider further evaluation with hepatobiliary scintigraphy study with CCK as clinically warranted. 2. No biliary ductal dilatation. 3. Mild diffuse hepatic steatosis.     I personally reviewed the radiologic studies    Critical Care: CRITICAL CARE Performed by: Jennye Moccasin   Total critical care time: 41 minutes   Critical care time was exclusive of separately billable procedures and treating other patients.  Critical care was necessary to treat or prevent imminent or life-threatening deterioration.  Critical care was time spent personally by me on the following activities: development of treatment plan with patient and/or surrogate as well as nursing, discussions with consultants, evaluation of patient's response to treatment, examination of patient, obtaining history from patient or surrogate, ordering and performing treatments and interventions, ordering and review of laboratory studies, ordering  and review of radiographic studies, pulse oximetry and re-evaluation of patient's condition.  Evaluation and assessment for altered mental status with new onset hepatic failure with anemia and guaiac positive stool   ED Course: The patient was given IV fluids and her blood pressure has improved but still remained slightly hypotensive. Her mental status improved with fluids and time here in emergency department. Repeat fingerstick showed some stabilization of her blood sugars at this time. It has pending Tylenol toxicity levels along with aspirin and other toxins and assessments. Her ultrasound of her right upper quadrant does not show any obvious biliary tree obstruction. Plan is to review the case with the hospitalist team likely for inpatient admission.    Assessment:  Liver failure Gastrointestinal bleed  Final Clinical Impression:   Final diagnoses:  Right upper quadrant abdominal pain     Plan:  Inpatient management            Jennye Moccasin, MD 10/24/15 1626

## 2015-10-24 NOTE — ED Notes (Addendum)
Patient had two episodes of dry heaves. No emesis noted. Patient is irritable, but compliant.

## 2015-10-24 NOTE — ED Notes (Signed)
Patient given swabs for c/o dry mouth. Patient declined swab.

## 2015-10-25 DIAGNOSIS — K7200 Acute and subacute hepatic failure without coma: Secondary | ICD-10-CM | POA: Insufficient documentation

## 2015-10-25 DIAGNOSIS — D65 Disseminated intravascular coagulation [defibrination syndrome]: Secondary | ICD-10-CM | POA: Insufficient documentation

## 2015-10-25 DIAGNOSIS — F332 Major depressive disorder, recurrent severe without psychotic features: Secondary | ICD-10-CM | POA: Insufficient documentation

## 2015-10-25 LAB — TYPE AND SCREEN
ABO/RH(D): O POS
Antibody Screen: NEGATIVE
Unit division: 0

## 2015-10-25 LAB — HEPATITIS PANEL, ACUTE
HCV Ab: >11 s/co ratio — ABNORMAL HIGH (ref 0.0–0.9)
HEP A IGM: NEGATIVE
HEP B C IGM: NEGATIVE
Hepatitis B Surface Ag: NEGATIVE

## 2015-11-18 ENCOUNTER — Emergency Department
Admission: EM | Admit: 2015-11-18 | Discharge: 2015-11-18 | Disposition: A | Discharge status: Home / Self Care | Payer: Self-pay | Attending: Emergency Medicine | Admitting: Emergency Medicine

## 2015-11-18 DIAGNOSIS — K7210 Chronic hepatic failure without coma: Secondary | ICD-10-CM | POA: Insufficient documentation

## 2015-11-18 DIAGNOSIS — R188 Other ascites: Secondary | ICD-10-CM | POA: Insufficient documentation

## 2015-11-18 DIAGNOSIS — Z79899 Other long term (current) drug therapy: Secondary | ICD-10-CM | POA: Insufficient documentation

## 2015-11-18 LAB — COMPREHENSIVE METABOLIC PANEL
ALBUMIN: 2.3 g/dL — AB (ref 3.5–5.0)
ALT: 35 U/L (ref 14–54)
ANION GAP: 9 (ref 5–15)
AST: 75 U/L — ABNORMAL HIGH (ref 15–41)
Alkaline Phosphatase: 117 U/L (ref 38–126)
BILIRUBIN TOTAL: 5.4 mg/dL — AB (ref 0.3–1.2)
BUN: <5 mg/dL — ABNORMAL LOW (ref 6–20)
CALCIUM: 8 mg/dL — AB (ref 8.9–10.3)
CHLORIDE: 104 mmol/L (ref 101–111)
CO2: 21 mmol/L — ABNORMAL LOW (ref 22–32)
Creatinine, Ser: 0.93 mg/dL (ref 0.44–1.00)
GFR calc Af Amer: >60 mL/min (ref >60)
Glucose, Bld: 108 mg/dL — ABNORMAL HIGH (ref 65–99)
POTASSIUM: 3.3 mmol/L — AB (ref 3.5–5.1)
Sodium: 134 mmol/L — ABNORMAL LOW (ref 135–145)
Total Protein: 7.5 g/dL (ref 6.5–8.1)

## 2015-11-18 LAB — URINALYSIS COMPLETE WITH MICROSCOPIC (ARMC ONLY)
Bacteria, UA: NONE SEEN
Glucose, UA: NEGATIVE mg/dL
Ketones, ur: NEGATIVE mg/dL
Nitrite: NEGATIVE
Protein, ur: 30 mg/dL — AB
Specific Gravity, Urine: 1.026 (ref 1.005–1.030)
pH: 5 (ref 5.0–8.0)

## 2015-11-18 LAB — CBC
HCT: 30.9 % — ABNORMAL LOW (ref 35.0–47.0)
Hemoglobin: 10.1 g/dL — ABNORMAL LOW (ref 12.0–16.0)
MCH: 26.3 pg (ref 26.0–34.0)
MCHC: 32.6 g/dL (ref 32.0–36.0)
MCV: 80.5 fL (ref 80.0–100.0)
Platelets: 313 K/uL (ref 150–440)
RBC: 3.83 MIL/uL (ref 3.80–5.20)
RDW: 28.4 % — ABNORMAL HIGH (ref 11.5–14.5)
WBC: 9.7 K/uL (ref 3.6–11.0)

## 2015-11-18 LAB — LIPASE, BLOOD: LIPASE: 115 U/L — AB (ref 11–51)

## 2015-11-18 LAB — ETHANOL: Alcohol, Ethyl (B): <5 mg/dL (ref <5)

## 2015-11-18 MED ORDER — SPIRONOLACTONE 25 MG PO TABS
50.0000 mg | ORAL_TABLET | Freq: Every day | ORAL | Status: DC
  Administered 2015-11-18: 50 mg via ORAL
  Filled 2015-11-18: qty 2

## 2015-11-18 MED ORDER — FUROSEMIDE 20 MG PO TABS: 20.0000 mg | ORAL_TABLET | Freq: Two times a day (BID) | ORAL | Status: DC

## 2015-11-18 MED ORDER — SPIRONOLACTONE 50 MG PO TABS: 50.0000 mg | ORAL_TABLET | Freq: Every day | ORAL | Status: DC

## 2015-11-18 MED ORDER — FUROSEMIDE 10 MG/ML IJ SOLN
20.0000 mg | Freq: Once | INTRAMUSCULAR | Status: AC
  Administered 2015-11-18: 20 mg via INTRAVENOUS
  Filled 2015-11-18: qty 4

## 2015-11-18 NOTE — Discharge Instructions (Signed)
Take Lasix twice a day and Aldactone once a day as prescribed. This will help pull excess fluid from your body can reduce the fluid buildup in your belly and your legs. [facility]. Call them tomorrow to set up an appointment soon. If they are unable to see you, you can follow-up with Dr. Servando SnareWohl. Return to the emergency department if you have worsening pain, if you have weakness or fever, or feel other urgent concerns.  Ascites Ascites is a collection of excess fluid in the abdomen. Ascites can range from mild to severe. It can get worse without treatment. CAUSES Possible causes include:  Cirrhosis. This is the most common cause of ascites.  Infection or inflammation in the abdomen.  Cancer in the abdomen.  Heart failure.  Kidney disease.  Inflammation of the pancreas.  Clots in the veins of the liver. SIGNS AND SYMPTOMS Signs and symptoms may include:  A feeling of fullness in your abdomen. This is common.  An increase in the size of your abdomen or your waist.  Swelling in your legs.  Swelling of the scrotum in men.  Difficulty breathing.  Abdominal pain.  Sudden weight gain. If the condition is mild, you may not have symptoms. DIAGNOSIS To make a diagnosis, your health care provider will:  Ask about your medical history.  Perform a physical exam.  Order imaging tests, such as an ultrasound or CT scan of your abdomen. TREATMENT Treatment depends on the cause of the ascites. It may include:  Taking a pill to make you urinate. This is called a water pill (diuretic pill).  Strictly reducing your salt (sodium) intake. Salt can cause extra fluid to be kept in the body, and this makes ascites worse.  Having a procedure to remove fluid from your abdomen (paracentesis).  Having a procedure to transfer fluid from your abdomen into a vein.  Having a procedure that connects two of the major veins within your liver and relieves pressure on your  liver (TIPS procedure). Ascites may go away or improve with treatment of the condition that caused it.  HOME CARE INSTRUCTIONS  Keep track of your weight. To do this, weigh yourself at the same time every day and record your weight.  Keep track of how much you drink and any changes in the amount you urinate.  Follow any instructions that your health care provider gives you about how much to drink.  Try not to eat salty (high-sodium) foods.  Take medicines only as directed by your health care provider.  Keep all follow-up visits as directed by your health care provider. This is important.  Report any changes in your health to your health care provider, especially if you develop new symptoms or your symptoms get worse. SEEK MEDICAL CARE IF:  Your gain more than 3 pounds in 3 days.  Your abdominal size or your waist size increases.  You have new swelling in your legs.  The swelling in your legs gets worse. SEEK IMMEDIATE MEDICAL CARE IF:  You develop a fever.  You develop confusion.  You develop new or worsening difficulty breathing.  You develop new or worsening abdominal pain.  You develop new or worsening swelling in the scrotum (in men).   This information is not intended to replace advice given to you by your health care provider. Make sure you discuss any questions you have with your health care provider.   Document Released: 12/01/2005 Document Revised: 12/22/2014 Document Reviewed: 06/30/2014 Elsevier Interactive Patient Education  2016 [employer] ° °

## 2015-11-18 NOTE — ED Notes (Signed)
[facility] c/o either vaginal of rectal bleeding X 3 days. Pt [facility] and had [facility]acentesis performed. Abdomen tight and distended. Pt c/o abdominal pain and headache. Pt alert and oriented X4, active, cooperative, pt in NAD. RR even and unlabored, color WNL.

## 2015-11-18 NOTE — ED Provider Notes (Addendum)
[facility] Emergency Department Provider Note  ____________________________________________  Time seen: 1340   History by:  patient, along with mother and girlfriend  HISTORY  Chief Complaint Abdominal Pain     HPI Kara Lawrence is a 39 y.o. female who was recently seen in the emergency department on November 9 with acute panic failure. She was transferred to [facility] where she remained for 2 weeks. She was recently discharged. 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  She presents to the emergency part in today due to noting blood on toilet paper. She is unable to tell me if this was from a vaginal or rectal source.  She complains of abdominal pain. While at [facility], she did have a paracentesis. She has follow-up with gastroenterology at [facility] in March.  She denies any fever, nausea or vomiting.   Past Medical History  Diagnosis Date  . Seizure (HCC)   . Bipolar 1 disorder (HCC)   . Manic depression (HCC)     Patient Active Problem List   Diagnosis Date Noted  . Severe recurrent major depression without psychotic features (HCC) 09/12/2015  . Alcohol use disorder, severe, dependence (HCC) 08/31/2015  . Alcohol withdrawal (HCC) 08/31/2015  . Stimulant use disorder (cocaine) 08/31/2015  . 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 Tubal ligation Bilateral     Current Outpatient Rx  Name  Route  Sig  Dispense  Refill  . citalopram (CELEXA) 40 MG tablet   Oral   Take 40 mg by mouth daily.         . divalproex (DEPAKOTE ER) 500 MG 24 hr tablet   Oral   Take 500 mg by mouth 2 (two) times daily.         . furosemide (LASIX) 20 MG tablet   Oral   Take 1 tablet (20 mg total) by mouth 2 (two) times daily.   60 tablet   0   . spironolactone (ALDACTONE) 50 MG tablet   Oral   Take 1 tablet (50 mg total) by mouth daily.   30  tablet   0   . traZODone (DESYREL) 150 MG tablet   Oral   Take 150 mg by mouth at bedtime.           Allergies Review of patient's allergies indicates no known allergies.  No family history on file.  Social History Social History  Substance Use Topics  . Smoking status: Never Smoker   . Smokeless tobacco: Never Used  . Alcohol Use: Yes     Comment: pt smells strongly of ETOH    Review of Systems  Constitutional: Negative for fever/chills. ENT: Negative for congestion. Cardiovascular: Negative for chest pain. Respiratory: Negative for cough. Gastrointestinal: Recent diagnosis of liver failure, ascites, now with abdominal pain. See history of present illness Genitourinary: Negative for dysuria. Musculoskeletal: No back pain. Skin: Negative for rash. Neurological: Negative for headache or focal weakness   10-point ROS otherwise negative.  ____________________________________________   PHYSICAL EXAM:  VITAL SIGNS: ED Triage Vitals  Enc Vitals Group     BP 11/18/15 1230 127/91 mmHg     Pulse Rate 11/18/15 1230 97     Resp 11/18/15 1230 20     Temp 11/18/15 1230 98.8 F (37.1 C)     Temp Source 11/18/15 1230 Oral     SpO2 11/18/15 1230 98 %  Weight 11/18/15 1230 101 lb (45.813 kg)     Height 11/18/15 1230  (1.626 m)     Head Cir --      Peak Flow --      Pain Score 11/18/15 1231 7     Pain Loc --      Pain Edu? --      Excl. in GC? --     Constitutional: Alert and oriented. Well appearing and in no distress. ENT   Head: Normocephalic and atraumatic.   Nose: No congestion/rhinnorhea.       Eyes: Notable jaundice.      Mouth: No erythema, no swelling   Cardiovascular: Normal rate, regular rhythm, no murmur noted Respiratory:  Normal respiratory effort, no tachypnea.    Breath sounds are clear and equal bilaterally.  Gastrointestinal: Distended abdomen with ascites. Mild diffuse tenderness. Back: No muscle spasm, no tenderness, no CVA  tenderness. Musculoskeletal: No deformity noted. Nontender with normal range of motion in all extremities.  No noted edema. Neurologic:  Communicative. Normal appearing spontaneous movement in all 4 extremities. No gross focal neurologic deficits are appreciated.  Skin:  Skin is warm, dry. No rash noted. Psychiatric: Mood and affect are normal. Speech and behavior are normal.  ____________________________________________    LABS (pertinent positives/negatives)  Labs Reviewed  LIPASE, BLOOD - Abnormal; Notable for the following:    Lipase 115 (*)    All other components within normal limits  COMPREHENSIVE METABOLIC PANEL - Abnormal; Notable for the following:    Sodium 134 (*)    Potassium 3.3 (*)    CO2 21 (*)    Glucose, Bld 108 (*)    BUN <5 (*)    Calcium 8.0 (*)    Albumin 2.3 (*)    AST 75 (*)    Total Bilirubin 5.4 (*)    All other components within normal limits  CBC - Abnormal; Notable for the following:    Hemoglobin 10.1 (*)    HCT 30.9 (*)    RDW 28.4 (*)    All other components within normal limits  ETHANOL  URINALYSIS COMPLETEWITH MICROSCOPIC ([facility] ONLY)  URINE DRUG SCREEN, QUALITATIVE ([facility] ONLY)  POC URINE PREG, ED     ____________________________________________   EKG  ED ECG REPORT I, Jana Swartzlander W, the attending physician, personally viewed and interpreted this ECG.   Date: 11/18/2015  EKG Time: 1234  Rate: 92  Rhythm:  Normal sinus rhythm  Axis: Normal  Intervals: QTC is 496  ST&T Change: None noted   ____________________________________________   ____________________________________________   INITIAL IMPRESSION / ASSESSMENT AND PLAN / ED COURSE  Pertinent labs & imaging results that were available during my care of the patient were reviewed by me and considered in my medical decision making (see chart for details).  39 year old female with recently diagnosed acute hepatic failure area and her blood tests are significantly better  now than they were when seen here on November 9. Her liver enzyme tests are significant only lower. Her bilirubin is a little bit lower then at that time and seen immediately lower than some of the higher values she had a [facility] (records have been reviewed)  Patient overall appears stable. She has mild discomfort with her ascites.   ----------------------------------------- 2:49 PM on 11/18/2015 -----------------------------------------  Have called and spoken with Dr. Servando Snare, gastroenterology. We reviewed her clinical appearance. I do not believe the patient has peritonitis. Her exam is too benign for this. We will not pursue a diagnostic  or therapeutic tap at this time. He does recommend being sure the patient is on a diuretic regimen. He recommends 20 of Lasix and 50 of Aldactone.   ----------------------------------------- 3:33 PM on 11/18/2015 -----------------------------------------  I spoken with [facility]. They agree with the above plan. They will attempt to arrange closer follow-up for her in the next 1-2 weeks.     ____________________________________________   FINAL CLINICAL IMPRESSION(S) / ED DIAGNOSES  Final diagnoses:  Ascites  Chronic liver failure without hepatic coma (HCC)  Hyperbilirubinemia      Darien Ramusavid W Klever [place], MD 11/18/15 1515  Darien Ramusavid W Milany Geck, MD 11/18/15 [phone]

## 2015-11-18 NOTE — ED Notes (Signed)
AAOx3.  Skin warm and dry. No SOB/ DOE.  D/C home. 

## 2015-11-23 ENCOUNTER — Other Ambulatory Visit: Payer: Self-pay

## 2015-11-23 DIAGNOSIS — Z87891 Personal history of nicotine dependence: Secondary | ICD-10-CM | POA: Insufficient documentation

## 2015-11-26 ENCOUNTER — Ambulatory Visit (INDEPENDENT_AMBULATORY_CARE_PROVIDER_SITE_OTHER): Payer: Self-pay | Admitting: Gastroenterology

## 2015-11-26 ENCOUNTER — Encounter: Payer: Self-pay | Admitting: Gastroenterology

## 2015-11-26 ENCOUNTER — Other Ambulatory Visit
Admission: RE | Admit: 2015-11-26 | Discharge: 2015-11-26 | Disposition: A | Discharge status: Home / Self Care | Payer: Self-pay | Source: Ambulatory Visit | Attending: Gastroenterology | Admitting: Gastroenterology

## 2015-11-26 VITALS — BP 119/80 | HR 102 | Temp 99.6°F | Ht 63.0 in | Wt 124.0 lb

## 2015-11-26 DIAGNOSIS — K7031 Alcoholic cirrhosis of liver with ascites: Secondary | ICD-10-CM | POA: Insufficient documentation

## 2015-11-26 LAB — COMPREHENSIVE METABOLIC PANEL
ALBUMIN: 2.4 g/dL — AB (ref 3.5–5.0)
ALT: 21 U/L (ref 14–54)
AST: 54 U/L — AB (ref 15–41)
Alkaline Phosphatase: 128 U/L — ABNORMAL HIGH (ref 38–126)
Anion gap: 9 (ref 5–15)
BUN: 6 mg/dL (ref 6–20)
CHLORIDE: 97 mmol/L — AB (ref 101–111)
CO2: 31 mmol/L (ref 22–32)
Calcium: 9 mg/dL (ref 8.9–10.3)
Creatinine, Ser: 0.78 mg/dL (ref 0.44–1.00)
GFR calc Af Amer: >60 mL/min (ref >60)
GFR calc non Af Amer: >60 mL/min (ref >60)
GLUCOSE: 95 mg/dL (ref 65–99)
POTASSIUM: 3.1 mmol/L — AB (ref 3.5–5.1)
SODIUM: 137 mmol/L (ref 135–145)
TOTAL PROTEIN: 8.1 g/dL (ref 6.5–8.1)
Total Bilirubin: 5.8 mg/dL — ABNORMAL HIGH (ref 0.3–1.2)

## 2015-11-26 NOTE — Progress Notes (Signed)
Gastroenterology Consultation  Referring Provider:     No ref. provider found Primary Care Physician:  No PCP Per Patient Primary Gastroenterologist:  Dr. Servando Snare     Reason for Consultation:     Cirrhosis        HPI:   Kara Lawrence is a 39 y.o. y/o female referred for consultation & management of cirrhosis by Dr. Bonnetta Barry PCP Per Patient.  This patient was admitted to [facility] with alcoholic cirrhosis and ascites tach in September. The patient states she has not drank since then. The patient was in the emergency room recently with distention of her abdomen. The patient was started on 50 mg of Aldactone and 20 mg of Lasix twice a day. The patient states she has been taking the Lasix in the morning and at night and she takes the Aldactone in the morning. The patient states that her swelling of her legs have gone completely away and her bili has gone down but not back to normal. The patient reports that she still has not been drinking since her discharge from the hospital. There is no report of any fevers chills nausea or vomiting. The patient also states that she has a lot of heartburn but it appears that she completely stopped her for tonic. Is no report of any NSAID use and she has been worried about what she can eat.  Past Medical History  Diagnosis Date  . Seizure (HCC)   . Bipolar 1 disorder (HCC)   . Manic depression (HCC)     Past Surgical History  Procedure Laterality Date  . Tubal ligation Bilateral     Prior to Admission medications   Medication Sig Start Date End Date Taking? Authorizing Provider  furosemide (LASIX) 20 MG tablet Take 1 tablet (20 mg total) by mouth 2 (two) times daily. 11/18/15 11/17/16 Yes Darien Ramus, MD  magnesium oxide (MAG-OX) 400 MG tablet Take 400 mg by mouth. 11/12/15 11/11/16 Yes Historical Provider, MD  Melatonin 3 MG TABS Take 6 mg by mouth. 11/12/15  Yes Historical Provider, MD  pantoprazole (PROTONIX) 40 MG tablet Take 40 mg by mouth.  11/12/15 12/12/15 Yes Historical Provider, MD  spironolactone (ALDACTONE) 50 MG tablet Take 1 tablet (50 mg total) by mouth daily. 11/18/15 11/17/16 Yes Darien Ramus, MD  citalopram (CELEXA) 40 MG tablet Take 40 mg by mouth daily.    Historical Provider, MD  divalproex (DEPAKOTE ER) 500 MG 24 hr tablet Take 500 mg by mouth 2 (two) times daily.    Historical Provider, MD  sucralfate (CARAFATE) 1 GM/10ML suspension Take by mouth. 11/12/15 12/12/15  Historical Provider, MD  thiamine 100 MG tablet Take 100 mg by mouth. 11/12/15 11/11/16  Historical Provider, MD  traZODone (DESYREL) 150 MG tablet Take 150 mg by mouth at bedtime.    Historical Provider, MD    Family History  Problem Relation Age of Onset  .        Social History  Substance Use Topics  . Smoking status: Never Smoker   . Smokeless tobacco: Never Used  . Alcohol Use: No     Comment: pt smells strongly of ETOH    Allergies as of 11/26/2015  . (No Known Allergies)    Review of Systems:    All systems reviewed and negative except where noted in HPI.   Physical Exam:  BP 119/80 mmHg  Pulse 102  Temp(Src) 99.6 F (37.6 C) (Oral)  Ht  (1.6 m)  Wt  124 lb (56.246 kg)  BMI 21.97 kg/m2 No LMP recorded. Psych:  Alert and cooperative. Normal mood and affect. General:   Alert,  Well-developed, well-nourished, pleasant and cooperative in NAD Head:  Normocephalic and atraumatic. Eyes:  Sclera clear, no icterus.   Conjunctiva pink. Ears:  Normal auditory acuity. Nose:  No deformity, discharge, or lesions. Mouth:  No deformity or lesions,oropharynx pink & moist. Abdomen:  Distended with positive ascites.  Rectal:  Deferred.  Msk:  Symmetrical without gross deformities.  Good, equal movement & strength bilaterally. Extremities:  No clubbing or edema.  No cyanosis. Neurologic:  Alert and oriented x3;  grossly normal neurologically. Skin:  Intact without significant lesions or rashes.  No jaundice. Lymph Nodes:  No  significant cervical adenopathy. Psych:  Alert and cooperative. Normal mood and affect.  Imaging Studies: No results found.  Assessment and Plan:   [name] is a 39 y.o. y/o female who has cirrhosis from alcohol abuse. The patient will have her blood sent off for a CMP and alpha-fetoprotein. The patient will start taking the Lasix once a day and continue the Aldactone once a day. The patient will also have her labs checked today and again in a week from now to make sure her renal function is stable. Her blood pressure is now stable and she denies any dizziness associated with her diuresis. He should has also been told to try and avoid anti-inflammatory medications. She has also been told that a rehabilitation program would be beneficial in case she should desire a liver transplant in the future. The patient will be contacted if her labs show anything abnormal.   Note: This dictation was prepared with Dragon dictation along with smaller phrase technology. Any transcriptional errors that result from this process are unintentional.

## 2015-11-27 ENCOUNTER — Telehealth: Payer: Self-pay

## 2015-11-27 LAB — AFP TUMOR MARKER: AFP-Tumor Marker: 7.3 ng/mL (ref 0.0–8.3)

## 2015-11-27 NOTE — Telephone Encounter (Signed)
-----   Message from Midge Miniumarren Wohl, MD sent at 11/27/2015  1:24 PM EST ----- The patient know that her labs are better but not back to normal. She also should have her blood checked in one week. Her blood test for liver cancer was negative.

## 2015-11-27 NOTE — Telephone Encounter (Signed)
Pt's grandmother has been notified of lab results. Grandmother stated pt doesn't live with her but will give her the information when she sees her. No other number available to contact pt.

## 2015-11-28 ENCOUNTER — Other Ambulatory Visit: Payer: Self-pay

## 2015-11-28 DIAGNOSIS — K7031 Alcoholic cirrhosis of liver with ascites: Secondary | ICD-10-CM

## 2015-12-03 ENCOUNTER — Other Ambulatory Visit: Payer: Self-pay

## 2015-12-03 DIAGNOSIS — K2100 Gastro-esophageal reflux disease with esophagitis, without bleeding: Secondary | ICD-10-CM

## 2015-12-03 MED ORDER — PANTOPRAZOLE SODIUM 40 MG PO TBEC: 40.0000 mg | DELAYED_RELEASE_TABLET | Freq: Every day | ORAL | Status: DC

## 2015-12-13 ENCOUNTER — Other Ambulatory Visit
Admission: RE | Admit: 2015-12-13 | Discharge: 2015-12-13 | Disposition: A | Discharge status: Home / Self Care | Payer: Self-pay | Source: Ambulatory Visit | Attending: Gastroenterology | Admitting: Gastroenterology

## 2015-12-13 DIAGNOSIS — F314 Bipolar disorder, current episode depressed, severe, without psychotic features: Secondary | ICD-10-CM | POA: Insufficient documentation

## 2015-12-13 LAB — VALPROIC ACID LEVEL: VALPROIC ACID LVL: 62 ug/mL (ref 50.0–100.0)

## 2015-12-19 ENCOUNTER — Other Ambulatory Visit: Payer: Self-pay | Admitting: Gastroenterology

## 2015-12-19 NOTE — Telephone Encounter (Signed)
Should she continue medication? Needs refill. She feels the fluid is coming off. [facility] Call her at [phone] please

## 2016-01-15 ENCOUNTER — Telehealth: Payer: Self-pay

## 2016-01-15 NOTE — Telephone Encounter (Signed)
She can try and stop it but it may come back.

## 2016-01-15 NOTE — Telephone Encounter (Signed)
Pt called today stating her ascites has resolved and she isn't retaining fluid. She would like to stop her Lasix and Spironolactone. I advised her it is controlled because she has been taking her medication. She still would like to stop. Please advise if she medically should continue taking or if she can stop it.

## 2016-01-15 NOTE — Telephone Encounter (Signed)
Called pt back to inform her that per Dr. Servando Snare she can try and stop her Lasix and Spironolactone if she would like but the ascites may come back. Pt is aware and will stop tomorrow as she has already taken her medication today. Advised her if ascites come back she needs to call me and let me know.

## 2016-01-20 ENCOUNTER — Other Ambulatory Visit: Payer: Self-pay | Admitting: Gastroenterology

## 2016-01-28 ENCOUNTER — Other Ambulatory Visit: Payer: Self-pay

## 2016-01-28 DIAGNOSIS — R188 Other ascites: Principal | ICD-10-CM

## 2016-01-28 DIAGNOSIS — K7460 Unspecified cirrhosis of liver: Secondary | ICD-10-CM

## 2016-01-28 MED ORDER — SPIRONOLACTONE 50 MG PO TABS: 50.0000 mg | ORAL_TABLET | Freq: Every day | ORAL | Status: DC

## 2017-08-05 ENCOUNTER — Emergency Department: Payer: Self-pay

## 2017-08-05 ENCOUNTER — Emergency Department
Admission: EM | Admit: 2017-08-05 | Discharge: 2017-08-06 | Disposition: A | Discharge status: Home / Self Care | Payer: Self-pay | Attending: Emergency Medicine | Admitting: Emergency Medicine

## 2017-08-05 DIAGNOSIS — Z79899 Other long term (current) drug therapy: Secondary | ICD-10-CM | POA: Insufficient documentation

## 2017-08-05 DIAGNOSIS — S0083XA Contusion of other part of head, initial encounter: Secondary | ICD-10-CM | POA: Insufficient documentation

## 2017-08-05 DIAGNOSIS — W19XXXA Unspecified fall, initial encounter: Secondary | ICD-10-CM

## 2017-08-05 DIAGNOSIS — Y9301 Activity, walking, marching and hiking: Secondary | ICD-10-CM | POA: Insufficient documentation

## 2017-08-05 DIAGNOSIS — Y908 Blood alcohol level of 240 mg/100 ml or more: Secondary | ICD-10-CM | POA: Insufficient documentation

## 2017-08-05 DIAGNOSIS — F10929 Alcohol use, unspecified with intoxication, unspecified: Secondary | ICD-10-CM | POA: Insufficient documentation

## 2017-08-05 DIAGNOSIS — Y999 Unspecified external cause status: Secondary | ICD-10-CM | POA: Insufficient documentation

## 2017-08-05 DIAGNOSIS — W1789XA Other fall from one level to another, initial encounter: Secondary | ICD-10-CM | POA: Insufficient documentation

## 2017-08-05 DIAGNOSIS — Y92410 Unspecified street and highway as the place of occurrence of the external cause: Secondary | ICD-10-CM | POA: Insufficient documentation

## 2017-08-05 LAB — COMPREHENSIVE METABOLIC PANEL
ALK PHOS: 71 U/L (ref 38–126)
ALT: 37 U/L (ref 14–54)
AST: 89 U/L — ABNORMAL HIGH (ref 15–41)
Albumin: 3.7 g/dL (ref 3.5–5.0)
Anion gap: 10 (ref 5–15)
BILIRUBIN TOTAL: 0.4 mg/dL (ref 0.3–1.2)
BUN: 8 mg/dL (ref 6–20)
CALCIUM: 8.3 mg/dL — AB (ref 8.9–10.3)
CO2: 22 mmol/L (ref 22–32)
CREATININE: 0.64 mg/dL (ref 0.44–1.00)
Chloride: 109 mmol/L (ref 101–111)
GFR calc non Af Amer: >60 mL/min (ref >60)
Glucose, Bld: 86 mg/dL (ref 65–99)
Potassium: 3.4 mmol/L — ABNORMAL LOW (ref 3.5–5.1)
SODIUM: 141 mmol/L (ref 135–145)
TOTAL PROTEIN: 7.9 g/dL (ref 6.5–8.1)

## 2017-08-05 LAB — URINE DRUG SCREEN, QUALITATIVE (ARMC ONLY)
Amphetamines, Ur Screen: NOT DETECTED
BARBITURATES, UR SCREEN: NOT DETECTED
Benzodiazepine, Ur Scrn: NOT DETECTED
CANNABINOID 50 NG, UR ~~LOC~~: NOT DETECTED
Cocaine Metabolite,Ur ~~LOC~~: NOT DETECTED
MDMA (ECSTASY) UR SCREEN: NOT DETECTED
Methadone Scn, Ur: NOT DETECTED
Opiate, Ur Screen: NOT DETECTED
PHENCYCLIDINE (PCP) UR S: NOT DETECTED
TRICYCLIC, UR SCREEN: NOT DETECTED

## 2017-08-05 LAB — CBC WITH DIFFERENTIAL/PLATELET
Basophils Absolute: 0 10*3/uL (ref 0–0.1)
Basophils Relative: 1 %
EOS PCT: 0 %
Eosinophils Absolute: 0 10*3/uL (ref 0–0.7)
HCT: 31.3 % — ABNORMAL LOW (ref 35.0–47.0)
Hemoglobin: 10.3 g/dL — ABNORMAL LOW (ref 12.0–16.0)
LYMPHS ABS: 1.9 10*3/uL (ref 1.0–3.6)
LYMPHS PCT: 33 %
MCH: 23.1 pg — AB (ref 26.0–34.0)
MCHC: 33 g/dL (ref 32.0–36.0)
MCV: 69.8 fL — AB (ref 80.0–100.0)
Monocytes Absolute: 0.3 10*3/uL (ref 0.2–0.9)
Monocytes Relative: 6 %
NEUTROS PCT: 60 %
Neutro Abs: 3.3 10*3/uL (ref 1.4–6.5)
Platelets: 411 10*3/uL (ref 150–440)
RBC: 4.48 MIL/uL (ref 3.80–5.20)
RDW: 18.8 % — ABNORMAL HIGH (ref 11.5–14.5)
WBC: 5.6 10*3/uL (ref 3.6–11.0)

## 2017-08-05 LAB — URINALYSIS, COMPLETE (UACMP) WITH MICROSCOPIC
BILIRUBIN URINE: NEGATIVE
GLUCOSE, UA: NEGATIVE mg/dL
KETONES UR: NEGATIVE mg/dL
LEUKOCYTES UA: NEGATIVE
Nitrite: NEGATIVE
PROTEIN: NEGATIVE mg/dL
Specific Gravity, Urine: 1.003 — ABNORMAL LOW (ref 1.005–1.030)
WBC UA: NONE SEEN WBC/hpf (ref 0–5)
pH: 5 (ref 5.0–8.0)

## 2017-08-05 LAB — PREGNANCY, URINE: Preg Test, Ur: NEGATIVE

## 2017-08-05 LAB — ACETAMINOPHEN LEVEL: Acetaminophen (Tylenol), Serum: <10 ug/mL — ABNORMAL LOW (ref 10–30)

## 2017-08-05 LAB — LACTIC ACID, PLASMA
Lactic Acid, Venous: 2.2 mmol/L (ref 0.5–1.9)
Lactic Acid, Venous: 1.8 mmol/L (ref 0.5–1.9)

## 2017-08-05 LAB — TROPONIN I: Troponin I: <0.03 ng/mL (ref <0.03)

## 2017-08-05 LAB — ETHANOL: Alcohol, Ethyl (B): 424 mg/dL (ref <5)

## 2017-08-05 LAB — LIPASE, BLOOD: LIPASE: 188 U/L — AB (ref 11–51)

## 2017-08-05 MED ORDER — THIAMINE HCL 100 MG/ML IJ SOLN
Freq: Once | INTRAVENOUS | Status: AC
  Administered 2017-08-06: 01:00:00 via INTRAVENOUS
  Filled 2017-08-05: qty 1000

## 2017-08-05 MED ORDER — TETRACAINE HCL 0.5 % OP SOLN
OPHTHALMIC | Status: AC
  Administered 2017-08-05: 2 [drp]
  Filled 2017-08-05: qty 4

## 2017-08-05 MED ORDER — LORAZEPAM 2 MG/ML IJ SOLN
1.0000 mg | Freq: Once | INTRAMUSCULAR | Status: AC
  Administered 2017-08-05: 1 mg via INTRAVENOUS
  Filled 2017-08-05: qty 1

## 2017-08-05 MED ORDER — BACITRACIN ZINC 500 UNIT/GM EX OINT
TOPICAL_OINTMENT | CUTANEOUS | Status: AC
  Administered 2017-08-05: 22:00:00
  Filled 2017-08-05: qty 0.9

## 2017-08-05 NOTE — ED Notes (Signed)
Date and time results received: 08/05/17 2056 (use smartphrase ".now" to insert current time)  Test: Lactic Acid Critical Value: 2.2  Name of Provider Notified: Dr. Darnelle Catalan  Orders Received? Or Actions Taken?:

## 2017-08-05 NOTE — ED Notes (Signed)
Kara Lawrence [phone]

## 2017-08-05 NOTE — ED Notes (Signed)
Pt is still not allowing me to take her vital signs and being extremely difficult and verbally abusive.

## 2017-08-05 NOTE — ED Notes (Signed)
Pt is refusing to let me give her care. Wont allow me take vital signs. Pt is extremely difficult.
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
## 2017-08-05 NOTE — ED Notes (Signed)
Kara Lawrence (sister) [phone] (cell) [phone] (home)

## 2017-08-05 NOTE — ED Notes (Signed)
Took neck brace off per Dr. Darnelle Catalan. Cleaned and bandaged left eye.

## 2017-08-05 NOTE — ED Notes (Signed)
Date and time results received: 08/05/17 2116 (use smartphrase ".now" to insert current time)  Test: Alcohol level Critical Value: 424  Name of Provider Notified: Dr. Darnelle Catalan  Orders Received? Or Actions Taken?:

## 2017-08-05 NOTE — ED Notes (Signed)
Helped pt use bathroom

## 2017-08-05 NOTE — ED Notes (Signed)
Dr Darnelle Catalan stated to call CT to come and scan pt that he did not want to wait on urine or preg test just to shield her - CT notified

## 2017-08-06 LAB — ETHANOL: Alcohol, Ethyl (B): 77 mg/dL — ABNORMAL HIGH (ref <5)

## 2017-08-06 MED ORDER — ERYTHROMYCIN 5 MG/GM OP OINT: TOPICAL_OINTMENT | Freq: Three times a day (TID) | OPHTHALMIC | 0 refills | Status: AC

## 2017-08-06 MED ORDER — SODIUM CHLORIDE 0.9 % IV BOLUS (SEPSIS)
1000.0000 mL | Freq: Once | INTRAVENOUS | Status: AC
  Administered 2017-08-06: 1000 mL via INTRAVENOUS

## 2017-08-06 NOTE — ED Notes (Signed)
Per pt request lights cut out and door closed.

## 2017-08-06 NOTE — ED Notes (Signed)
Pt laying in bed, VSS, in NAD, IV continues to run at 150 ml/hour with no complications, site clean dry and intact without evidence of infiltration or phlebitis. Pt awoke during VS check, requested coke, provided by this RN. Pt denies further needs at this time. Will continue to monitor.

## 2017-08-06 NOTE — ED Notes (Addendum)
Pt needs to use toilet. Pt refusing to wear socks to restroom, this RN recommended d/t sanitation concerns for bare feet on bathroom floor. Pt states rather use bedpan than wear socks during ambulation. This RN assist with bedpan. Pt requesting milk and sprite, provided by this RN. Pt denies further needs at this time. NAD noted. This RN continue to monitor.

## 2017-08-06 NOTE — ED Notes (Signed)
Pt given breakfast tray. Pt is aware and ask that tray be placed on bedside table. IV monitor silenced due to being complete and RN notified.

## 2017-08-06 NOTE — ED Notes (Signed)
Pt resting comfortably in bed at this time in NAD with equal, unlabored respirations noted. This RN will continue to monitor.

## 2017-08-06 NOTE — Consult Note (Signed)
Patient seen this morning with significant periorbital swelling, ecchymosis, periocular skin abrasion, reassuring CT scan.  Soft tissues are still very tender and patient is not allowing examination at this time.   Will return about noon and reattempt examination at that time.

## 2017-08-06 NOTE — ED Notes (Signed)
Pt ambulated to bathroom with minimal assistance. Pt ambulated back to bed with minimal assistance and told this tech to shut her door.

## 2017-08-06 NOTE — ED Notes (Signed)
Pt very angry and refused to allow this tech to get vitals. Pt is very short with staff and wants staff to leave her alone.

## 2017-08-06 NOTE — ED Provider Notes (Signed)
-----------------------------------------   7:11 AM on 08/06/2017 -----------------------------------------  Patient slept overnight after receiving calming agents. Awaiting ophthalmology evaluation and sobriety. Care transferred to Dr. Don Perking.   Irean Hong, MD 08/06/17 [phone]
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
## 2017-08-06 NOTE — ED Provider Notes (Signed)
-----------------------------------------   7:38 AM on 08/06/2017 -----------------------------------------   Blood pressure 100/75, pulse 95, temperature 98.1 F (36.7 C), temperature source Oral, resp. rate 14, height 5\' 2"  (1.575 m), weight 58.1 kg (128 lb), SpO2 97 %.  Assuming care from Dr. Dolores Frame of [place] is a 41 y.o. female with a chief complaint of Fall .    Please refer to H&P by previous MD for further details.  The current plan of care is to f/u ophtho recs and reassess once sober.  Ophtho attempted to examine patient who refused to be examined. Will repeat alcohol level at this time. Explained to patient that ophtho is willing to return later in the day to examine her eye. If alcohol level is back to normal and patient continues to refuse eye exam will dc home. Risks of permanent visual loss have been discussed with patient.  _________________________ 1:18 PM on 08/06/2017 -----------------------------------------  Patient evaluated by ophtho with no eye injuries seen on exam. Recommended topical erythromycin to the abrasion surrounding the eye and close follow-up with them in the office on Monday.Return precautions given to patient. She is going be discharged home at this time.     Don Perking, [place], MD 08/06/17 [phone]
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
## 2017-08-06 NOTE — ED Notes (Signed)
Report to amy, rn

## 2017-08-06 NOTE — Discharge Instructions (Signed)
You were seen in the emergency department after a fall. Luckily all of your imaging studies did not show any evidence of injuries. Follow-up with you doctor within the next 2-3 days for further evaluation. Sometimes injuries can present at a later time and therefore it is imperative that you return to the emergency room if you have a severe headache, facial droop, neck pain, numbness or weakness of your extremities, slurred speech, difficulty finding words, chest pain, back pain, abdominal pain, or any other new symptoms that were not present during this visit. You may take Tylenol at home for your pain.   You must follow up with ophthalmology on Monday. Apply erythromycin ointment to the abrasion on the skin around the eye.

## 2017-08-06 NOTE — ED Notes (Addendum)
Pt can be heard yelling from her room - "Nurse  Nurse " pt  requesting her lights be turned off and her door closed

## 2017-08-06 NOTE — ED Notes (Addendum)
Pt continues to rest in bed in NAD with equal and unlabored respirations. IV remains running at 168ml/hr without complications. Will continue to monitor.

## 2017-08-06 NOTE — ED Notes (Signed)
opthalmologist consult completed - he will update EDP    Pt verbally abusive when she asked staff to close her door and turn her light off again

## 2017-10-21 ENCOUNTER — Telehealth: Payer: Self-pay | Admitting: Pharmacy Technician

## 2017-10-21 NOTE — Telephone Encounter (Signed)
Patient eligible to receive medication assistance at [facility] through 2018, as long as eligibility requirements continue to be met.  Kara Lawrence J. [name] Care Manager [facility] 

## 2017-12-21 ENCOUNTER — Emergency Department: Payer: Self-pay

## 2017-12-21 ENCOUNTER — Other Ambulatory Visit: Payer: Self-pay

## 2017-12-21 ENCOUNTER — Emergency Department
Admission: EM | Admit: 2017-12-21 | Discharge: 2017-12-22 | Disposition: A | Discharge status: Home / Self Care | Payer: Self-pay | Attending: Emergency Medicine | Admitting: Emergency Medicine

## 2017-12-21 ENCOUNTER — Encounter: Payer: Self-pay | Admitting: Emergency Medicine

## 2017-12-21 DIAGNOSIS — F10120 Alcohol abuse with intoxication, uncomplicated: Secondary | ICD-10-CM | POA: Insufficient documentation

## 2017-12-21 DIAGNOSIS — F1590 Other stimulant use, unspecified, uncomplicated: Secondary | ICD-10-CM | POA: Diagnosis present

## 2017-12-21 DIAGNOSIS — R45851 Suicidal ideations: Secondary | ICD-10-CM | POA: Insufficient documentation

## 2017-12-21 DIAGNOSIS — F319 Bipolar disorder, unspecified: Secondary | ICD-10-CM

## 2017-12-21 DIAGNOSIS — M25512 Pain in left shoulder: Secondary | ICD-10-CM | POA: Insufficient documentation

## 2017-12-21 DIAGNOSIS — F332 Major depressive disorder, recurrent severe without psychotic features: Secondary | ICD-10-CM | POA: Insufficient documentation

## 2017-12-21 DIAGNOSIS — F1020 Alcohol dependence, uncomplicated: Secondary | ICD-10-CM | POA: Diagnosis present

## 2017-12-21 DIAGNOSIS — Z79899 Other long term (current) drug therapy: Secondary | ICD-10-CM | POA: Insufficient documentation

## 2017-12-21 DIAGNOSIS — F10929 Alcohol use, unspecified with intoxication, unspecified: Secondary | ICD-10-CM

## 2017-12-21 DIAGNOSIS — Y908 Blood alcohol level of 240 mg/100 ml or more: Secondary | ICD-10-CM | POA: Insufficient documentation

## 2017-12-21 LAB — CBC WITH DIFFERENTIAL/PLATELET
Basophils Absolute: 0.1 10*3/uL (ref 0–0.1)
Basophils Relative: 1 %
EOS ABS: 0 10*3/uL (ref 0–0.7)
Eosinophils Relative: 0 %
HCT: 31.7 % — ABNORMAL LOW (ref 35.0–47.0)
HEMOGLOBIN: 10.1 g/dL — AB (ref 12.0–16.0)
LYMPHS ABS: 3.5 10*3/uL (ref 1.0–3.6)
LYMPHS PCT: 38 %
MCH: 21.6 pg — ABNORMAL LOW (ref 26.0–34.0)
MCHC: 32 g/dL (ref 32.0–36.0)
MCV: 67.4 fL — ABNORMAL LOW (ref 80.0–100.0)
Monocytes Absolute: 0.9 10*3/uL (ref 0.2–0.9)
Monocytes Relative: 10 %
NEUTROS PCT: 51 %
Neutro Abs: 4.8 10*3/uL (ref 1.4–6.5)
Platelets: 423 10*3/uL (ref 150–440)
RBC: 4.71 MIL/uL (ref 3.80–5.20)
RDW: 20.3 % — ABNORMAL HIGH (ref 11.5–14.5)
WBC: 9.3 10*3/uL (ref 3.6–11.0)

## 2017-12-21 LAB — COMPREHENSIVE METABOLIC PANEL
ALK PHOS: 82 U/L (ref 38–126)
ALT: 27 U/L (ref 14–54)
ANION GAP: 15 (ref 5–15)
AST: 55 U/L — ABNORMAL HIGH (ref 15–41)
Albumin: 4.1 g/dL (ref 3.5–5.0)
BILIRUBIN TOTAL: 0.6 mg/dL (ref 0.3–1.2)
BUN: 10 mg/dL (ref 6–20)
CALCIUM: 8.8 mg/dL — AB (ref 8.9–10.3)
CO2: 20 mmol/L — ABNORMAL LOW (ref 22–32)
Chloride: 103 mmol/L (ref 101–111)
Creatinine, Ser: 0.64 mg/dL (ref 0.44–1.00)
GFR calc Af Amer: >60 mL/min (ref >60)
GFR calc non Af Amer: >60 mL/min (ref >60)
Glucose, Bld: 64 mg/dL — ABNORMAL LOW (ref 65–99)
Potassium: 3.5 mmol/L (ref 3.5–5.1)
SODIUM: 138 mmol/L (ref 135–145)
TOTAL PROTEIN: 8.7 g/dL — AB (ref 6.5–8.1)

## 2017-12-21 LAB — ETHANOL: Alcohol, Ethyl (B): 309 mg/dL (ref <10)

## 2017-12-21 MED ORDER — ACETAMINOPHEN 325 MG PO TABS
650.0000 mg | ORAL_TABLET | Freq: Once | ORAL | Status: AC
  Administered 2017-12-22: 650 mg via ORAL
  Filled 2017-12-21: qty 2

## 2017-12-21 NOTE — ED Notes (Signed)
TTS is at bedside conducting assessment on Pt.

## 2017-12-21 NOTE — ED Notes (Addendum)
Pt dressed in burgundy scrubs by Mayra and myself. Pts belongings include sneakers, socks, jeans, shirt, hat, skull cap, coat, and purse.  Pt denies having any money or valuables to be locked up.  Pts jewelry includes 1 silver colored ring with clear stone, 1 silver colored ring with square clear stone that is broken at the bottom of the band, 1 tarnished silver colored ring with clear stone, 2 pair of silver colored hoop earrings, 1-3 piece gold colored ring with multiple different colored stones, 1 pair pearl like stud earrings, 1 pair silver colored stud earrings with clear colored stone.  Pt threw away a pair of pink underwear.  Pts belongings bagged, labelled and held at the quad nurses station.

## 2017-12-21 NOTE — ED Notes (Signed)
X-ray is at bedside completing orders on the Pt.
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
## 2017-12-21 NOTE — ED Notes (Signed)
Lab called with a critical high alcohol level of 302 Dr. York CeriseForbach was notified.
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
## 2017-12-22 DIAGNOSIS — F319 Bipolar disorder, unspecified: Secondary | ICD-10-CM

## 2017-12-22 LAB — VALPROIC ACID LEVEL: Valproic Acid Lvl: <10 ug/mL — ABNORMAL LOW (ref 50.0–100.0)

## 2017-12-22 MED ORDER — TRAZODONE HCL 150 MG PO TABS: 150.0000 mg | ORAL_TABLET | Freq: Every day | ORAL | 0 refills | Status: DC

## 2017-12-22 MED ORDER — DIVALPROEX SODIUM ER 250 MG PO TB24
ORAL_TABLET | ORAL | Status: AC
  Filled 2017-12-22: qty 2

## 2017-12-22 MED ORDER — TRAZODONE HCL 50 MG PO TABS
150.0000 mg | ORAL_TABLET | Freq: Every day | ORAL | Status: DC
  Administered 2017-12-22: 150 mg via ORAL
  Filled 2017-12-22: qty 3

## 2017-12-22 MED ORDER — ONDANSETRON 4 MG PO TBDP
4.0000 mg | ORAL_TABLET | Freq: Once | ORAL | Status: AC
  Administered 2017-12-22: 4 mg via ORAL
  Filled 2017-12-22: qty 1

## 2017-12-22 MED ORDER — TRAZODONE HCL 150 MG PO TABS: 150.0000 mg | ORAL_TABLET | Freq: Every day | ORAL | 2 refills | Status: DC

## 2017-12-22 MED ORDER — DIVALPROEX SODIUM ER 500 MG PO TB24: 500.0000 mg | ORAL_TABLET | Freq: Two times a day (BID) | ORAL | 0 refills | Status: DC

## 2017-12-22 MED ORDER — DIVALPROEX SODIUM ER 500 MG PO TB24: 500.0000 mg | ORAL_TABLET | Freq: Two times a day (BID) | ORAL | 2 refills | Status: DC

## 2017-12-22 MED ORDER — ACETAMINOPHEN 500 MG PO TABS
1000.0000 mg | ORAL_TABLET | Freq: Once | ORAL | Status: AC
  Administered 2017-12-22: 1000 mg via ORAL
  Filled 2017-12-22: qty 2

## 2017-12-22 MED ORDER — TRAZODONE HCL 100 MG PO TABS
ORAL_TABLET | ORAL | Status: AC
  Filled 2017-12-22: qty 1

## 2017-12-22 MED ORDER — TRAZODONE HCL 50 MG PO TABS
ORAL_TABLET | ORAL | Status: AC
  Filled 2017-12-22: qty 1

## 2017-12-22 MED ORDER — DIVALPROEX SODIUM ER 500 MG PO TB24
500.0000 mg | ORAL_TABLET | Freq: Two times a day (BID) | ORAL | Status: DC
  Administered 2017-12-22: 500 mg via ORAL
  Filled 2017-12-22: qty 2
  Filled 2017-12-22: qty 1

## 2017-12-22 NOTE — ED Notes (Signed)
Dr.Clapacs rescinded IVC @ 1425 Amy C. RN & ODS [place] are aware

## 2017-12-22 NOTE — ED Notes (Signed)
Patient still not ready for shower

## 2017-12-22 NOTE — ED Notes (Signed)
Patient refused shower at this time,patient states her cramps are bad and the nurse gave her medication earlier ,I WILL ASK AGAIN LATER

## 2017-12-22 NOTE — ED Notes (Signed)
Pt discharged to lobby while waiting for cab with voucher.  Pt has bag of medicines for 1 week from pharmacy.  Laney PastorStephanie rn charge nurse aware and Royetta Crochettina rn  First nurse aware.

## 2017-12-22 NOTE — ED Notes (Signed)
Dr clapacs in with pt now.  

## 2017-12-22 NOTE — ED Notes (Signed)
[facility]  Pt alert calm and cooperative.  Pt watching tv.  Pt waiting to see psychiatrist.

## 2017-12-22 NOTE — ED Notes (Signed)
Pt using phone  

## 2017-12-22 NOTE — ED Notes (Signed)
Meal given to Pt.

## 2017-12-22 NOTE — ED Notes (Signed)
Psych consult pending.

## 2017-12-22 NOTE — ED Notes (Signed)
meds given

## 2017-12-22 NOTE — ED Notes (Signed)
ivc per dr York Ceriseforbach / notified [facility]/ and nurse papers faxed to magistrate and [place] officer signed and was told the rm patient was in officer walked to room to see patient custody order was given back and placed on chart

## 2017-12-22 NOTE — ED Notes (Signed)
Social worker Audiological scientistjenay contacted and said to give patient a taxi voucher.  Charge nurse stephanie rn aware.

## 2017-12-22 NOTE — Consult Note (Signed)
[facility] Consult   Reason for Consult: Consult for 42 year old woman with a history of mood disorder and substance abuse who came into the hospital intoxicated Referring Physician: Rip Harbour Patient Identification: Kara Lawrence MRN:  [id number] Principal Diagnosis: Bipolar 1 disorder [facility]) Diagnosis:   Patient Active Problem List   Diagnosis Date Noted  . Bipolar 1 disorder ([place]) [F31.9] 12/22/2017    Priority: High  . Personal history of nicotine dependence [Z87.891] 11/23/2015  . Acute hepatic failure [K72.00] 10/25/2015  . Disseminated intravascular coagulation ([place]) [D65] 10/25/2015  . 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 Severe episode of recurrent major depressive disorder, without psychotic features ([place]) [F33.2] 10/25/2015  . Hepatic injury [S36.119A] 10/24/2015  . Severe recurrent major depression without psychotic features ([place]) [F33.2] 09/12/2015  . Alcohol use disorder, severe, dependence ([place]) [F10.20] 08/31/2015  . Alcohol withdrawal ([place]) [F10.239] 08/31/2015  . Stimulant use disorder (cocaine) [F15.90] 08/31/2015  . 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 Stimulant abuse ([place]) [F15.10] 08/31/2015    Total Time spent with patient: 1 hour  Subjective:   Kara Lawrence is a 42 y.o. female patient admitted with "me and my boyfriend are having problems".  HPI: Patient interviewed chart reviewed.  42 year old woman with a history of substance abuse and mood disorder came to the hospital last night by EMS.  Complaints were of joint pain but also suicidal ideation.  Patient was intoxicated on presentation and was talking about killing herself.  She tells me today that she had been thinking about dying last night and walking around in the street but had not really intended to kill herself.  Mood has been depressed for weeks now.  Major stressor is problems that she has  with her boyfriend.  She sleeps poorly eats poorly.  Also had an aunt die in December which was a big emotional strain for her.  Patient is continuing to drink regularly and has on occasion used other drugs.  She is noncompliant with her prescribed psychiatric medicine and has not been following up at all with outpatient psychiatric treatment.  Social history: Patient lives with her boyfriend.  Not working.  Trying to get disability.  Has extended family she stays in close touch with.  Medical history: Patient knows that she has had some liver insult from her chronic heavy drinking.  Does not really follow up with any medical care.  Substance abuse history: Long-standing alcohol abuse.  Patient does have a history of seizures although she does not remember when the last one was, no clear history of DTs.  History of cocaine use as well.  Patient has not followed up with outpatient attempts to stay sober.  Past Psychiatric History: Patient has been diagnosed variously with bipolar disorder with major depression and with substance related mood disorder in the past.  She has had several prior hospitalizations and does have a prior history of suicidality.  She was prescribed Depakote and trazodone in the past which she says was helpful in keeping her stable but she has been noncompliant with it.  Risk to Self: Suicidal Ideation: Yes-Currently Present Suicidal Intent: Yes-Currently Present Is patient at risk for suicide?: No Suicidal Plan?: Yes-Currently Present Specify Current Suicidal Plan: to get hit by a car Access to Means: Yes Specify Access to Suicidal Means: wants to walk into traffic What has been your use of drugs/alcohol within the last 12 months?: 1 beer a day How many times?: (a lot)  Other Self Harm Risks: denied Triggers for Past Attempts: Unknown Intentional Self Injurious Behavior: None Risk to Others: Homicidal Ideation: No Thoughts of Harm to Others: No Current Homicidal Intent:  No Current Homicidal Plan: No Access to Homicidal Means: No Identified Victim: None identified History of harm to others?: No Assessment of Violence: None Noted Violent Behavior Description: denied Does patient have access to weapons?: No Criminal Charges Pending?: No Does patient have a court date: No Prior Inpatient Therapy: Prior Inpatient Therapy: Yes Prior Therapy Dates: 2017 Prior Therapy Facilty/Provider(s): [facility] Reason for Treatment: Suicidal Prior Outpatient Therapy: Prior Outpatient Therapy: Yes Prior Therapy Dates: Current Prior Therapy Facilty/Provider(s): [facility] Reason for Treatment: Depression Does patient have an ACCT team?: No Does patient have Intensive In-House Services?  : No Does patient have [facility] services? : No Does patient have P4CC services?: No  Past Medical History:  Past Medical History:  Diagnosis Date  . Bipolar 1 disorder ([place])   . Manic depression ([place])   . Seizure [facility])     Past Surgical History:  Procedure Laterality Date  . TUBAL LIGATION Bilateral    Family History: History reviewed. No pertinent family history. Family Psychiatric  History: Positive for substance abuse Social History:  Social History   Substance and Sexual Activity  Alcohol Use No   Comment: pt smells strongly of ETOH     Social History   Substance and Sexual Activity  Drug Use No    Social History   Socioeconomic History  . Marital status: Single    Spouse name: None  . Number of children: None  . Years of education: None  . Highest education level: None  Social Needs  . Financial resource strain: None  . Food insecurity - worry: None  . Food insecurity - inability: None  . Transportation needs - medical: None  . Transportation needs - non-medical: None  Occupational History  . None  Tobacco Use  . Smoking status: Never Smoker  . Smokeless tobacco: Never Used  Substance and Sexual Activity  . Alcohol use: No    Comment: pt smells strongly of ETOH   . Drug use: No  . Sexual activity: Yes    Birth control/protection: None  Other Topics Concern  . None  Social History Narrative  . None   Additional Social History:    Allergies:  No Known Allergies  Labs:  Results for orders placed or performed during the hospital encounter of 12/21/17 (from the past 48 hour(s))  Comprehensive metabolic panel     Status: Abnormal   Collection Time: 12/21/17 10:58 PM  Result Value Ref Range   Sodium 138 135 - 145 mmol/L   Potassium 3.5 3.5 - 5.1 mmol/L   Chloride 103 101 - 111 mmol/L   CO2 20 (L) 22 - 32 mmol/L   Glucose, Bld 64 (L) 65 - 99 mg/dL   BUN 10 6 - 20 mg/dL   Creatinine, Ser 0.64 0.44 - 1.00 mg/dL   Calcium 8.8 (L) 8.9 - 10.3 mg/dL   Total Protein 8.7 (H) 6.5 - 8.1 g/dL   Albumin 4.1 3.5 - 5.0 g/dL   AST 55 (H) 15 - 41 U/L   ALT 27 14 - 54 U/L   Alkaline Phosphatase 82 38 - 126 U/L   Total Bilirubin 0.6 0.3 - 1.2 mg/dL   GFR calc non Af Amer >60 >60 mL/min   GFR calc Af Amer >60 >60 mL/min    Comment: (NOTE) The eGFR has been calculated using  the CKD EPI equation. This calculation has not been validated in all clinical situations. eGFR's persistently <60 mL/min signify possible Chronic Kidney Disease.    Anion gap 15 5 - 15    Comment: Performed at [facility], [address] [place], [place] [address]  Ethanol     Status: Abnormal   Collection Time: 12/21/17 10:58 PM  Result Value Ref Range   Alcohol, Ethyl (B) 309 (HH) <10 mg/dL    Comment: CRITICAL RESULT CALLED TO, READ BACK BY AND VERIFIED WITH ALLISON PATE AT 2356 12/21/17 ALV        LOWEST DETECTABLE LIMIT FOR SERUM ALCOHOL IS 10 mg/dL FOR MEDICAL PURPOSES ONLY Performed at [facility], [address] [place], [place] [address]   CBC with Diff     Status: Abnormal   Collection Time: 12/21/17 10:58 PM  Result Value Ref Range   WBC 9.3 3.6 - 11.0 K/uL   RBC 4.71 3.80 - 5.20 MIL/uL   Hemoglobin 10.1 (L) 12.0 - 16.0 g/dL   HCT 31.7 (L)  35.0 - 47.0 %   MCV 67.4 (L) 80.0 - 100.0 fL   MCH 21.6 (L) 26.0 - 34.0 pg   MCHC 32.0 32.0 - 36.0 g/dL   RDW 20.3 (H) 11.5 - 14.5 %   Platelets 423 150 - 440 K/uL   Neutrophils Relative % 51 %   Neutro Abs 4.8 1.4 - 6.5 K/uL   Lymphocytes Relative 38 %   Lymphs Abs 3.5 1.0 - 3.6 K/uL   Monocytes Relative 10 %   Monocytes Absolute 0.9 0.2 - 0.9 K/uL   Eosinophils Relative 0 %   Eosinophils Absolute 0.0 0 - 0.7 K/uL   Basophils Relative 1 %   Basophils Absolute 0.1 0 - 0.1 K/uL    Comment: Performed at [facility], [address] [place], [place] [address]  Valproic acid level     Status: Abnormal   Collection Time: 12/21/17 10:58 PM  Result Value Ref Range   Valproic Acid Lvl <10 (L) 50.0 - 100.0 ug/mL    Comment: Performed at [facility], [address] [place], [place] [address]    Current Facility-Administered Medications  Medication Dose Route Frequency Provider Last Rate Last Dose  . divalproex (DEPAKOTE ER) 24 hr tablet 500 mg  500 mg Oral BID Clapacs, Madie Reno, MD   500 mg at 12/22/17 1649  . traZODone (DESYREL) 50 MG tablet           . traZODone (DESYREL) tablet 150 mg  150 mg Oral QHS Clapacs, Madie Reno, MD   150 mg at 12/22/17 1649   Current Outpatient Medications  Medication Sig Dispense Refill  . divalproex (DEPAKOTE ER) 500 MG 24 hr tablet Take 1 tablet (500 mg total) by mouth 2 (two) times daily. 60 tablet 2  . furosemide (LASIX) 20 MG tablet TAKE 1 TABLET BY MOUTH TWICE A DAY (Patient not taking: Reported on 12/22/2017) 60 tablet 1  . pantoprazole (PROTONIX) 40 MG tablet Take 1 tablet (40 mg total) by mouth daily. 30 tablet 6  . spironolactone (ALDACTONE) 50 MG tablet Take 1 tablet (50 mg total) by mouth daily. (Patient not taking: Reported on 12/22/2017) 30 tablet 6  . traZODone (DESYREL) 150 MG tablet Take 1 tablet (150 mg total) by mouth at bedtime. 30 tablet 2    Musculoskeletal: Strength & Muscle Tone: within normal limits Gait & Station:  normal Patient leans: N/A  Psychiatric Specialty Exam: Physical Exam  Nursing note and vitals reviewed. Constitutional: She appears well-developed and well-nourished.  HENT:  Head: Normocephalic and atraumatic.  Eyes: Conjunctivae are normal. Pupils are equal, round, and reactive to light.  Neck: Normal range of motion.  Cardiovascular: Regular rhythm and normal heart sounds.  Respiratory: Effort normal. No respiratory distress.  GI: Soft. She exhibits no distension.  Musculoskeletal: Normal range of motion.  Neurological: She is alert.  Skin: Skin is warm and dry.  Psychiatric: Her mood appears anxious. Her speech is delayed. She is slowed and withdrawn. Thought content is not paranoid. She expresses impulsivity. She exhibits a depressed mood. She expresses no homicidal and no suicidal ideation. She exhibits abnormal recent memory.    Review of Systems  Constitutional: Negative.   HENT: Negative.   Eyes: Negative.   Respiratory: Negative.   Cardiovascular: Negative.   Gastrointestinal: Negative.   Musculoskeletal: Negative.   Skin: Negative.   Neurological: Negative.   Psychiatric/Behavioral: Positive for depression and substance abuse. Negative for hallucinations, memory loss and suicidal ideas. The patient is nervous/anxious and has insomnia.     Blood pressure (!) 130/94, pulse 92, temperature 98.6 F (37 C), temperature source Oral, resp. rate 18, height _0  (1.6 m), weight 58.5 kg (129 lb), last menstrual period 12/21/2017, SpO2 100 %.Body mass index is 22.85 kg/m.  General Appearance: Disheveled  Eye Contact:  Good  Speech:  Slow  Volume:  Decreased  Mood:  Dysphoric  Affect:  Congruent  Thought Process:  Goal Directed  Orientation:  Full (Time, Place, and Person)  Thought Content:  Logical  Suicidal Thoughts:  No  Homicidal Thoughts:  No  Memory:  Immediate;   Fair Recent;   Fair Remote;   Fair  Judgement:  Fair  Insight:  Fair  Psychomotor Activity:   Decreased  Concentration:  Concentration: Fair  Recall:  [employer] of Knowledge:  Fair  Language:  Fair  Akathisia:  No  Handed:  Right  AIMS (if indicated):     Assets:  Desire for Improvement Housing Physical Health Resilience Social Support  ADL's:  Intact  Cognition:  WNL  Sleep:        Treatment Plan Summary: Medication management and Plan 42 year old woman with chronic alcohol abuse and mood disorder.  Now that she has sobered up she is no longer suicidal.  No sign of psychosis.  Patient is able to articulate an understanding of her condition and the importance of getting outpatient care.  She does not meet criteria for commitment and does not require inpatient treatment.  Patient is requesting to get back on her medicine.  I agreed to provide her with a 7-day supply of her Depakote and trazodone as well as prescriptions to get back on her medicine so that she can follow-up with Dr. Jacqualine Code at [facility].  Counseling completed about substance abuse treatment in the community.  Case reviewed with ER physician and TTS DC the involuntary commitment.  Disposition: No evidence of imminent risk to self or others at present.   Patient does not meet criteria for psychiatric inpatient admission. Discussed crisis plan, support from social network, calling 911, coming to the Emergency Department, and calling Suicide Hotline.  Alethia Berthold, MD 12/22/2017 8:41 PM

## 2017-12-22 NOTE — ED Provider Notes (Signed)
Dr. Mat Carnelay packs a seen the patient and will let them go they can follow-up with [facility] as they had been.   Arnaldo NatalMalinda, Paul F, MD 12/22/17 [id number]

## 2017-12-22 NOTE — Discharge Instructions (Signed)
please continue to follow-up with [facility]. Return as needed.

## 2017-12-22 NOTE — Clinical Social Work Note (Signed)
CSW received a consult for "Patient needs a ride home." Pt has no other means of transportation to get home. CSW verified pt address with RN Amy. RN Amy to complete transportation voucher for taxi transport. CSW signing off as no further Social Work needs identified.   Corlis HoveJeneya Gabby Rackers, Theresia MajorsLCSWA, [facility] Clinical Social Worker-ED [phone]

## 2017-12-24 ENCOUNTER — Emergency Department
Admission: EM | Admit: 2017-12-24 | Discharge: 2017-12-26 | Disposition: A | Discharge status: Other Acute Inpatient Hospital | Payer: Self-pay | Attending: Emergency Medicine | Admitting: Emergency Medicine

## 2017-12-24 DIAGNOSIS — F10239 Alcohol dependence with withdrawal, unspecified: Secondary | ICD-10-CM | POA: Diagnosis present

## 2017-12-24 DIAGNOSIS — F1020 Alcohol dependence, uncomplicated: Secondary | ICD-10-CM | POA: Diagnosis present

## 2017-12-24 DIAGNOSIS — F10939 Alcohol use, unspecified with withdrawal, unspecified: Secondary | ICD-10-CM | POA: Diagnosis present

## 2017-12-24 DIAGNOSIS — R45851 Suicidal ideations: Secondary | ICD-10-CM | POA: Insufficient documentation

## 2017-12-24 DIAGNOSIS — Z79899 Other long term (current) drug therapy: Secondary | ICD-10-CM | POA: Insufficient documentation

## 2017-12-24 DIAGNOSIS — F332 Major depressive disorder, recurrent severe without psychotic features: Secondary | ICD-10-CM | POA: Diagnosis present

## 2017-12-24 DIAGNOSIS — F1510 Other stimulant abuse, uncomplicated: Secondary | ICD-10-CM | POA: Diagnosis present

## 2017-12-24 DIAGNOSIS — F10929 Alcohol use, unspecified with intoxication, unspecified: Secondary | ICD-10-CM | POA: Insufficient documentation

## 2017-12-24 LAB — COMPREHENSIVE METABOLIC PANEL
ALK PHOS: 86 U/L (ref 38–126)
ALT: 32 U/L (ref 14–54)
AST: 70 U/L — AB (ref 15–41)
Albumin: 3.7 g/dL (ref 3.5–5.0)
Anion gap: 11 (ref 5–15)
BUN: 9 mg/dL (ref 6–20)
CALCIUM: 8.7 mg/dL — AB (ref 8.9–10.3)
CHLORIDE: 106 mmol/L (ref 101–111)
CO2: 24 mmol/L (ref 22–32)
CREATININE: 0.57 mg/dL (ref 0.44–1.00)
GFR calc Af Amer: >60 mL/min (ref >60)
GFR calc non Af Amer: >60 mL/min (ref >60)
Glucose, Bld: 98 mg/dL (ref 65–99)
Potassium: 3.5 mmol/L (ref 3.5–5.1)
SODIUM: 141 mmol/L (ref 135–145)
Total Bilirubin: 0.4 mg/dL (ref 0.3–1.2)
Total Protein: 8 g/dL (ref 6.5–8.1)

## 2017-12-24 LAB — ACETAMINOPHEN LEVEL: Acetaminophen (Tylenol), Serum: <10 ug/mL — ABNORMAL LOW (ref 10–30)

## 2017-12-24 LAB — CBC
HCT: 29 % — ABNORMAL LOW (ref 35.0–47.0)
Hemoglobin: 9.2 g/dL — ABNORMAL LOW (ref 12.0–16.0)
MCH: 21.4 pg — AB (ref 26.0–34.0)
MCHC: 31.8 g/dL — ABNORMAL LOW (ref 32.0–36.0)
MCV: 67.4 fL — AB (ref 80.0–100.0)
PLATELETS: 450 10*3/uL — AB (ref 150–440)
RBC: 4.31 MIL/uL (ref 3.80–5.20)
RDW: 20 % — AB (ref 11.5–14.5)
WBC: 5.9 10*3/uL (ref 3.6–11.0)

## 2017-12-24 LAB — SALICYLATE LEVEL

## 2017-12-24 LAB — ETHANOL: Alcohol, Ethyl (B): 342 mg/dL (ref <10)

## 2017-12-24 MED ORDER — DIPHENHYDRAMINE HCL 50 MG/ML IJ SOLN
50.0000 mg | Freq: Once | INTRAMUSCULAR | Status: AC
  Administered 2017-12-24: 50 mg via INTRAMUSCULAR

## 2017-12-24 MED ORDER — LORAZEPAM 2 MG/ML IJ SOLN
2.0000 mg | Freq: Once | INTRAMUSCULAR | Status: AC
  Administered 2017-12-24: 2 mg via INTRAMUSCULAR

## 2017-12-24 MED ORDER — HALOPERIDOL LACTATE 5 MG/ML IJ SOLN
5.0000 mg | Freq: Once | INTRAMUSCULAR | Status: AC
Start: 2017-12-24 — End: 2017-12-24
  Administered 2017-12-24: 5 mg via INTRAMUSCULAR
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
## 2017-12-25 DIAGNOSIS — F332 Major depressive disorder, recurrent severe without psychotic features: Secondary | ICD-10-CM

## 2017-12-25 LAB — URINE DRUG SCREEN, QUALITATIVE (ARMC ONLY)
AMPHETAMINES, UR SCREEN: NOT DETECTED
Barbiturates, Ur Screen: NOT DETECTED
Benzodiazepine, Ur Scrn: NOT DETECTED
CANNABINOID 50 NG, UR ~~LOC~~: NOT DETECTED
Cocaine Metabolite,Ur ~~LOC~~: POSITIVE — AB
MDMA (ECSTASY) UR SCREEN: NOT DETECTED
Methadone Scn, Ur: NOT DETECTED
OPIATE, UR SCREEN: NOT DETECTED
PHENCYCLIDINE (PCP) UR S: NOT DETECTED
Tricyclic, Ur Screen: NOT DETECTED

## 2017-12-25 LAB — PREGNANCY, URINE: PREG TEST UR: NEGATIVE

## 2017-12-25 MED ORDER — FOLIC ACID 1 MG PO TABS
1.0000 mg | ORAL_TABLET | Freq: Every day | ORAL | Status: DC
  Administered 2017-12-26: 1 mg via ORAL
  Filled 2017-12-25: qty 1

## 2017-12-25 MED ORDER — TRAZODONE HCL 50 MG PO TABS
150.0000 mg | ORAL_TABLET | Freq: Every day | ORAL | Status: DC
  Administered 2017-12-25: 150 mg via ORAL
  Filled 2017-12-25: qty 1

## 2017-12-25 MED ORDER — VITAMIN B-1 100 MG PO TABS
100.0000 mg | ORAL_TABLET | Freq: Every day | ORAL | Status: DC
  Administered 2017-12-26: 100 mg via ORAL
  Filled 2017-12-25: qty 1

## 2017-12-25 MED ORDER — LORAZEPAM 1 MG PO TABS: 1.0000 mg | ORAL_TABLET | Freq: Four times a day (QID) | ORAL | Status: DC | PRN

## 2017-12-25 MED ORDER — DIVALPROEX SODIUM 500 MG PO DR TAB
500.0000 mg | DELAYED_RELEASE_TABLET | Freq: Two times a day (BID) | ORAL | Status: DC
  Administered 2017-12-25 – 2017-12-26 (×2): 500 mg via ORAL
  Filled 2017-12-25 (×2): qty 1

## 2017-12-25 MED ORDER — ADULT MULTIVITAMIN W/MINERALS CH
1.0000 | ORAL_TABLET | Freq: Every day | ORAL | Status: DC
  Administered 2017-12-26: 1 via ORAL
  Filled 2017-12-25: qty 1

## 2017-12-25 MED ORDER — LORAZEPAM 2 MG/ML IJ SOLN: 1.0000 mg | Freq: Four times a day (QID) | INTRAMUSCULAR | Status: DC | PRN

## 2017-12-25 MED ORDER — THIAMINE HCL 100 MG/ML IJ SOLN: 100.0000 mg | Freq: Every day | INTRAMUSCULAR | Status: DC

## 2017-12-25 NOTE — Consult Note (Signed)
[facility] Consult   Reason for Consult: Consult for 42 year old woman with a history of substance abuse who came back to the hospital intoxicated and agitated last night Referring Physician: Rip Harbour Patient Identification: Kara Lawrence MRN:  [id number] Principal Diagnosis: Severe episode of recurrent major depressive disorder, without psychotic features [facility]) Diagnosis:   Patient Active Problem List   Diagnosis Date Noted  . Bipolar 1 disorder ([place]) [F31.9] 12/22/2017    Priority: High  . Severe episode of recurrent major depressive disorder, without psychotic features ([place]) [F33.2] 10/25/2015    Priority: High  . Alcohol use disorder, severe, dependence ([place]) [F10.20] 08/31/2015    Priority: High  . Alcohol withdrawal ([place]) [F10.239] 08/31/2015    Priority: High  . Stimulant abuse ([place]) [F15.10] 08/31/2015    Priority: High  . Personal history of nicotine dependence [Z87.891] 11/23/2015  . Acute hepatic failure [K72.00] 10/25/2015  . Disseminated intravascular coagulation ([place]) [D65] 10/25/2015  . 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 Hepatic injury [S36.119A] 10/24/2015  . Severe recurrent major depression without psychotic features ([place]) [F33.2] 09/12/2015  . Stimulant use disorder (cocaine) [F15.90] 08/31/2015  . 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 female patient admitted with "I am not doing well".  HPI: Patient interviewed chart reviewed.  Patient known from previous encounters.  Previous notes reviewed.  42 year old woman presented to the emergency room last night intoxicated, agitated, belligerent and voicing suicidal ideation.  Elevated alcohol level and evidence of cocaine abuse.  Patient is continuing to state suicidal ideation.  Mood is depressed down and overwhelmed.  Major  stresses include her substance abuse and fighting with her husband again.  Denies any acute hallucinations.  Claims that she was taking her prescribed medicine after we saw her the other day.  No longer feels safe about going home.  Medical history: Patient has a history of seizure disorder which might be mostly related to substance abuse but nevertheless is treated with Depakote when she takes it.  No other active medical problems.  Social history: Patient lives with her long-term partner or husband.  She is not working outside the home.  Chronic chaotic relationship with other members of her family although I think she does have a pretty close extended family.  Substance abuse history: Long-standing problems with alcohol and cocaine abuse.  Positive withdrawal seizures in the past.  Minimal participation in extended outpatient treatment although she has had referral to rehab in the past  Past Psychiatric History: Diagnosis of depression versus bipolar depression.  Depakote is reportedly uniquely helpful for her.  No history of suicide attempts.  History of suicidal ideation agitation and aggression especially when intoxicated.  Risk to Self: Is patient at risk for suicide?: (UTA) Risk to Others:   Prior Inpatient Therapy:   Prior Outpatient Therapy:    Past Medical History:  Past Medical History:  Diagnosis Date  . Bipolar 1 disorder ([place])   . Manic depression ([place])   . Seizure [facility])     Past Surgical History:  Procedure Laterality Date  . TUBAL LIGATION Bilateral    Family History: No family history on file. Family Psychiatric  History: Alcohol abuse Social History:  Social History   Substance and Sexual Activity  Alcohol Use No   Comment: pt smells strongly of ETOH     Social History  Substance and Sexual Activity  Drug Use No    Social History   Socioeconomic History  . Marital status: Single    Spouse name: None  . Number of children: None  . Years of education:  None  . Highest education level: None  Social Needs  . Financial resource strain: None  . Food insecurity - worry: None  . Food insecurity - inability: None  . Transportation needs - medical: None  . Transportation needs - non-medical: None  Occupational History  . None  Tobacco Use  . Smoking status: Never Smoker  . Smokeless tobacco: Never Used  Substance and Sexual Activity  . Alcohol use: No    Comment: pt smells strongly of ETOH  . Drug use: No  . Sexual activity: Yes    Birth control/protection: None  Other Topics Concern  . None  Social History Narrative  . None   Additional Social History:    Allergies:  No Known Allergies  Labs:  Results for orders placed or performed during the hospital encounter of 12/24/17 (from the past 48 hour(s))  Comprehensive metabolic panel     Status: Abnormal   Collection Time: 12/24/17 11:05 PM  Result Value Ref Range   Sodium 141 135 - 145 mmol/L   Potassium 3.5 3.5 - 5.1 mmol/L   Chloride 106 101 - 111 mmol/L   CO2 24 22 - 32 mmol/L   Glucose, Bld 98 65 - 99 mg/dL   BUN 9 6 - 20 mg/dL   Creatinine, Ser 0.57 0.44 - 1.00 mg/dL   Calcium 8.7 (L) 8.9 - 10.3 mg/dL   Total Protein 8.0 6.5 - 8.1 g/dL   Albumin 3.7 3.5 - 5.0 g/dL   AST 70 (H) 15 - 41 U/L   ALT 32 14 - 54 U/L   Alkaline Phosphatase 86 38 - 126 U/L   Total Bilirubin 0.4 0.3 - 1.2 mg/dL   GFR calc non Af Amer >60 >60 mL/min   GFR calc Af Amer >60 >60 mL/min    Comment: (NOTE) The eGFR has been calculated using the CKD EPI equation. This calculation has not been validated in all clinical situations. eGFR's persistently <60 mL/min signify possible Chronic Kidney Disease.    Anion gap 11 5 - 15    Comment: Performed at [facility], [address] [place], [place] [address]  Ethanol     Status: Abnormal   Collection Time: 12/24/17 11:05 PM  Result Value Ref Range   Alcohol, Ethyl (B) 342 (HH) <10 mg/dL    Comment: CRITICAL RESULT CALLED TO, READ BACK  BY AND VERIFIED WITH IRIS GUIDRY AT 0002 12/25/17 ALV        LOWEST DETECTABLE LIMIT FOR SERUM ALCOHOL IS 10 mg/dL FOR MEDICAL PURPOSES ONLY Performed at [facility], [address] [place], [place] [address]   Salicylate level     Status: None   Collection Time: 12/24/17 11:05 PM  Result Value Ref Range   Salicylate Lvl <0.0 2.8 - 30.0 mg/dL    Comment: Performed at [facility], [address] [place], [place] [address]  Acetaminophen level     Status: Abnormal   Collection Time: 12/24/17 11:05 PM  Result Value Ref Range   Acetaminophen (Tylenol), Serum <10 (L) 10 - 30 ug/mL    Comment:        THERAPEUTIC CONCENTRATIONS VARY SIGNIFICANTLY. A RANGE OF 10-30 ug/mL MAY BE AN EFFECTIVE CONCENTRATION FOR MANY PATIENTS. HOWEVER, SOME ARE BEST TREATED  AT CONCENTRATIONS OUTSIDE THIS RANGE. ACETAMINOPHEN CONCENTRATIONS >150 ug/mL AT 4 HOURS AFTER INGESTION AND >50 ug/mL AT 12 HOURS AFTER INGESTION ARE OFTEN ASSOCIATED WITH TOXIC REACTIONS. Performed at [facility], [address] [place], [place] [address]   cbc     Status: Abnormal   Collection Time: 12/24/17 11:05 PM  Result Value Ref Range   WBC 5.9 3.6 - 11.0 K/uL   RBC 4.31 3.80 - 5.20 MIL/uL   Hemoglobin 9.2 (L) 12.0 - 16.0 g/dL   HCT 29.0 (L) 35.0 - 47.0 %   MCV 67.4 (L) 80.0 - 100.0 fL   MCH 21.4 (L) 26.0 - 34.0 pg   MCHC 31.8 (L) 32.0 - 36.0 g/dL   RDW 20.0 (H) 11.5 - 14.5 %   Platelets 450 (H) 150 - 440 K/uL    Comment: Performed at [facility], [address] [place], [place] [address]  Urine Drug Screen, Qualitative     Status: Abnormal   Collection Time: 12/24/17 11:05 PM  Result Value Ref Range   Tricyclic, Ur Screen NONE DETECTED NONE DETECTED   Amphetamines, Ur Screen NONE DETECTED NONE DETECTED   MDMA (Ecstasy)Ur Screen NONE DETECTED NONE DETECTED   Cocaine Metabolite,Ur [place] POSITIVE (A) NONE DETECTED   Opiate, Ur Screen NONE DETECTED NONE DETECTED    Phencyclidine (PCP) Ur S NONE DETECTED NONE DETECTED   Cannabinoid 50 Ng, Ur [place] NONE DETECTED NONE DETECTED   Barbiturates, Ur Screen NONE DETECTED NONE DETECTED   Benzodiazepine, Ur Scrn NONE DETECTED NONE DETECTED   Methadone Scn, Ur NONE DETECTED NONE DETECTED    Comment: (NOTE) Tricyclics + metabolites, urine    Cutoff 1000 ng/mL Amphetamines + metabolites, urine  Cutoff 1000 ng/mL MDMA (Ecstasy), urine              Cutoff 500 ng/mL Cocaine Metabolite, urine          Cutoff 300 ng/mL Opiate + metabolites, urine        Cutoff 300 ng/mL Phencyclidine (PCP), urine         Cutoff 25 ng/mL Cannabinoid, urine                 Cutoff 50 ng/mL Barbiturates + metabolites, urine  Cutoff 200 ng/mL Benzodiazepine, urine              Cutoff 200 ng/mL Methadone, urine                   Cutoff 300 ng/mL The urine drug screen provides only a preliminary, unconfirmed analytical test result and should not be used for non-medical purposes. Clinical consideration and professional judgment should be applied to any positive drug screen result due to possible interfering substances. A more specific alternate chemical method must be used in order to obtain a confirmed analytical result. Gas chromatography / mass spectrometry (GC/MS) is the preferred confirmat ory method. Performed at [facility], [address] [place], [place] [address]     Current Facility-Administered Medications  Medication Dose Route Frequency Provider Last Rate Last Dose  . divalproex (DEPAKOTE) DR tablet 500 mg  500 mg Oral Q12H Clapacs, John T, MD      . folic acid (FOLVITE) tablet 1 mg  1 mg Oral Daily Clapacs, John T, MD      . LORazepam (ATIVAN) tablet 1 mg  1 mg Oral Q6H PRN Clapacs, Madie [place], MD       Or  . LORazepam (ATIVAN) injection 1 mg  1  mg Intravenous Q6H PRN Clapacs, John T, MD      . multivitamin with minerals tablet 1 tablet  1 tablet Oral Daily Clapacs, John T, MD      . thiamine (VITAMIN B-1)  tablet 100 mg  100 mg Oral Daily Clapacs, John T, MD       Or  . thiamine (B-1) injection 100 mg  100 mg Intravenous Daily Clapacs, John T, MD      . traZODone (DESYREL) tablet 150 mg  150 mg Oral QHS Clapacs, Madie Reno, MD       Current Outpatient Medications  Medication Sig Dispense Refill  . divalproex (DEPAKOTE ER) 500 MG 24 hr tablet Take 1 tablet (500 mg total) by mouth 2 (two) times daily. 60 tablet 2  . furosemide (LASIX) 20 MG tablet TAKE 1 TABLET BY MOUTH TWICE A DAY (Patient not taking: Reported on 12/22/2017) 60 tablet 1  . pantoprazole (PROTONIX) 40 MG tablet Take 1 tablet (40 mg total) by mouth daily. 30 tablet 6  . spironolactone (ALDACTONE) 50 MG tablet Take 1 tablet (50 mg total) by mouth daily. (Patient not taking: Reported on 12/22/2017) 30 tablet 6  . traZODone (DESYREL) 150 MG tablet Take 1 tablet (150 mg total) by mouth at bedtime. 30 tablet 2    Musculoskeletal: Strength & Muscle Tone: within normal limits Gait & Station: normal Patient leans: N/A  Psychiatric Specialty Exam: Physical Exam  Nursing note and vitals reviewed. Constitutional: She appears well-developed and well-nourished.  HENT:  Head: Normocephalic and atraumatic.  Eyes: Conjunctivae are normal. Pupils are equal, round, and reactive to light.  Neck: Normal range of motion.  Cardiovascular: Regular rhythm and normal heart sounds.  Respiratory: Effort normal. No respiratory distress.  GI: Soft.  Musculoskeletal: Normal range of motion.  Neurological: She is alert.  Skin: Skin is warm and dry.  Psychiatric: Her affect is angry and blunt. Her speech is delayed. She is slowed and withdrawn. Cognition and memory are impaired. She expresses impulsivity. She exhibits a depressed mood. She expresses suicidal ideation. She exhibits abnormal recent memory.    Review of Systems  Constitutional: Negative.   HENT: Negative.   Eyes: Negative.   Respiratory: Negative.   Cardiovascular: Negative.    Gastrointestinal: Negative.   Musculoskeletal: Negative.   Skin: Negative.   Neurological: Negative.   Psychiatric/Behavioral: Positive for depression, memory loss, substance abuse and suicidal ideas. Negative for hallucinations. The patient is nervous/anxious and has insomnia.     Weight 58.5 kg (129 lb), last menstrual period 12/21/2017.Body mass index is 22.85 kg/m.  General Appearance: Disheveled  Eye Contact:  Minimal  Speech:  Slow  Volume:  Decreased  Mood:  Depressed and Dysphoric  Affect:  Constricted and Depressed  Thought Process:  Goal Directed  Orientation:  Full (Time, Place, and Person)  Thought Content:  Logical, Paranoid Ideation, Rumination and Tangential  Suicidal Thoughts:  Yes.  without intent/plan  Homicidal Thoughts:  No  Memory:  Immediate;   Fair Recent;   Poor Remote;   Fair  Judgement:  Impaired  Insight:  Shallow  Psychomotor Activity:  Decreased  Concentration:  Concentration: Fair  Recall:  [employer] of Knowledge:  Fair  Language:  Fair  Akathisia:  No  Handed:  Right  AIMS (if indicated):     Assets:  Desire for Improvement Housing Physical Health Social Support  ADL's:  Intact  Cognition:  WNL  Sleep:        Treatment Plan Summary:  Daily contact with patient to assess and evaluate symptoms and progress in treatment, Medication management and Plan Patient with depression suicidal ideation active intoxication history of withdrawal seizures.  She was seen the other day in the emergency room in a similar condition and thought that she would be safe outside the hospital but has continued to drink and display dangerous out of control mood symptoms.  Patient meets criteria for admission.  Orders completed for admission to the psychiatric unit.  15-minute checks in place.  Orders completed for alcohol detox medication and continuing her usual Depakote and trazodone.  Labs reviewed.  Full set of labs will be done on admission.  Disposition:  Recommend psychiatric Inpatient admission when medically cleared. Supportive therapy provided about ongoing stressors.  Alethia Berthold, MD 12/25/2017 4:08 PM

## 2017-12-25 NOTE — ED Notes (Signed)
Patient transferred to room 5 BHU from the quad, no signs of distress, Patient is quiet and wants to sleep at this time, Nurse will continue to monitor, q 15 minute checks and camera surveillance in progress for safety.

## 2017-12-25 NOTE — ED Notes (Signed)
Spoke with patient while taking vitals, patient aware of being admitted to Behavior Medicine downstairs.  Pt. States prior admissions.  Pt. Denies SI/HI or AV/H.

## 2017-12-25 NOTE — ED Notes (Signed)
Patient is sleeping,  Has slept most of the day, [facility], Patient without any complaints today.

## 2017-12-25 NOTE — ED Notes (Signed)
Date and time results received: 12/25/17 0000  Test: Ethanol Critical Value: 342  Name of Provider Notified: Dr. York CeriseForbach  Orders Received? Or Actions Taken?: acknowledged

## 2017-12-25 NOTE — BH Assessment (Signed)
[facility]. Toni Amendlapacs.  Attending Physician will be Dr. Jennet MaduroPucilowska.   Patient has been [facility] to room 325, [facility] Nurse Lorenda HatchetSlade.   Intake Paper Work has been signed and placed on patient chart.  ER staff is aware of the admission Delaney Meigs( [facility].; Dr. Derrill KayGoodman, ER MD; Susy FrizzleMatt Patient's Nurse & Marylene LandAngela Patient Access).

## 2017-12-25 NOTE — ED Notes (Signed)
PT  IVC  SEEN  BY  DR  CLAPACS  PENDING  PLACEMENT 

## 2017-12-25 NOTE — ED Notes (Signed)
IVC pending consult   

## 2017-12-25 NOTE — ED Notes (Signed)
Unable to assess patient at this time.  Pt is too agitated to respond to questions at this time.

## 2017-12-25 NOTE — ED Notes (Signed)
Patient ate 100% of lunch and beverage, she has angry affect, nurse attempted to speak to her and she showed no interest, poor eye contact, she did ask where bathroom was, will continue to monitor for safety.

## 2017-12-25 NOTE — ED Notes (Signed)
Patient awake, drinking beverage, Patient is quiet, she wants to rest she states, nurse ask her if she needed anything or had any concerns and she said " no". Nurse will continue to monitor.

## 2017-12-25 NOTE — ED Notes (Signed)
Patient moved to BHU 5, informed Kara BambergerLisa Lawrence

## 2017-12-25 NOTE — ED Notes (Signed)
Patient did talk with sister on the phone and was calm and cooperative, Patient was told that she would be admitted and she states she is [facility] to [facility], Patient states that she still feels like dying, but no plan. Nurse will continue to monitor and camera surveillance for safety.

## 2017-12-25 NOTE — ED Notes (Signed)
Dr. Toni Amendlapacs talking with Patient at this time.

## 2017-12-26 ENCOUNTER — Inpatient Hospital Stay
Admission: RE | Admit: 2017-12-26 | Discharge: 2017-12-29 | DRG: 885 | Disposition: A | Discharge status: Home / Self Care | Payer: No Typology Code available for payment source | Attending: Psychiatry | Admitting: Psychiatry

## 2017-12-26 ENCOUNTER — Other Ambulatory Visit: Payer: Self-pay

## 2017-12-26 DIAGNOSIS — F1010 Alcohol abuse, uncomplicated: Secondary | ICD-10-CM | POA: Diagnosis present

## 2017-12-26 DIAGNOSIS — Z915 Personal history of self-harm: Secondary | ICD-10-CM

## 2017-12-26 DIAGNOSIS — F1410 Cocaine abuse, uncomplicated: Secondary | ICD-10-CM | POA: Diagnosis present

## 2017-12-26 DIAGNOSIS — F4310 Post-traumatic stress disorder, unspecified: Secondary | ICD-10-CM | POA: Diagnosis present

## 2017-12-26 DIAGNOSIS — K2100 Gastro-esophageal reflux disease with esophagitis, without bleeding: Secondary | ICD-10-CM

## 2017-12-26 DIAGNOSIS — Z811 Family history of alcohol abuse and dependence: Secondary | ICD-10-CM

## 2017-12-26 DIAGNOSIS — R45851 Suicidal ideations: Secondary | ICD-10-CM | POA: Diagnosis present

## 2017-12-26 DIAGNOSIS — F332 Major depressive disorder, recurrent severe without psychotic features: Principal | ICD-10-CM | POA: Diagnosis present

## 2017-12-26 DIAGNOSIS — Z599 Problem related to housing and economic circumstances, unspecified: Secondary | ICD-10-CM | POA: Diagnosis not present

## 2017-12-26 DIAGNOSIS — Z91410 Personal history of adult physical and sexual abuse: Secondary | ICD-10-CM | POA: Diagnosis not present

## 2017-12-26 DIAGNOSIS — F1910 Other psychoactive substance abuse, uncomplicated: Secondary | ICD-10-CM

## 2017-12-26 DIAGNOSIS — Z79899 Other long term (current) drug therapy: Secondary | ICD-10-CM

## 2017-12-26 DIAGNOSIS — G4700 Insomnia, unspecified: Secondary | ICD-10-CM | POA: Diagnosis present

## 2017-12-26 DIAGNOSIS — F1590 Other stimulant use, unspecified, uncomplicated: Secondary | ICD-10-CM | POA: Diagnosis present

## 2017-12-26 DIAGNOSIS — F1020 Alcohol dependence, uncomplicated: Secondary | ICD-10-CM | POA: Diagnosis present

## 2017-12-26 MED ORDER — VITAMIN B-1 100 MG PO TABS
100.0000 mg | ORAL_TABLET | Freq: Every day | ORAL | Status: DC
  Administered 2017-12-26 – 2017-12-29 (×4): 100 mg via ORAL
  Filled 2017-12-26 (×4): qty 1

## 2017-12-26 MED ORDER — MAGNESIUM HYDROXIDE 400 MG/5ML PO SUSP
30.0000 mL | Freq: Every day | ORAL | Status: DC | PRN
Start: 2017-12-26 — End: 2017-12-29

## 2017-12-26 MED ORDER — FOLIC ACID 1 MG PO TABS
1.0000 mg | ORAL_TABLET | Freq: Every day | ORAL | Status: DC
  Administered 2017-12-26 – 2017-12-29 (×4): 1 mg via ORAL
  Filled 2017-12-26 (×4): qty 1

## 2017-12-26 MED ORDER — INFLUENZA VAC SPLIT QUAD 0.5 ML IM SUSY: 0.5000 mL | PREFILLED_SYRINGE | INTRAMUSCULAR | Status: DC

## 2017-12-26 MED ORDER — DIVALPROEX SODIUM 500 MG PO DR TAB
500.0000 mg | DELAYED_RELEASE_TABLET | Freq: Two times a day (BID) | ORAL | Status: DC
  Administered 2017-12-26 – 2017-12-29 (×6): 500 mg via ORAL
  Filled 2017-12-26 (×6): qty 1

## 2017-12-26 MED ORDER — TRAZODONE HCL 50 MG PO TABS
150.0000 mg | ORAL_TABLET | Freq: Every day | ORAL | Status: DC
  Administered 2017-12-26 – 2017-12-28 (×3): 150 mg via ORAL
  Filled 2017-12-26 (×3): qty 1

## 2017-12-26 MED ORDER — LORAZEPAM 2 MG/ML IJ SOLN: 1.0000 mg | Freq: Four times a day (QID) | INTRAMUSCULAR | Status: DC | PRN

## 2017-12-26 MED ORDER — THIAMINE HCL 100 MG/ML IJ SOLN
100.0000 mg | Freq: Every day | INTRAMUSCULAR | Status: DC
  Filled 2017-12-26 (×4): qty 1

## 2017-12-26 MED ORDER — ADULT MULTIVITAMIN W/MINERALS CH
1.0000 | ORAL_TABLET | Freq: Every day | ORAL | Status: DC
  Administered 2017-12-26 – 2017-12-29 (×4): 1 via ORAL
  Filled 2017-12-26 (×4): qty 1

## 2017-12-26 MED ORDER — LORAZEPAM 1 MG PO TABS: 1.0000 mg | ORAL_TABLET | Freq: Four times a day (QID) | ORAL | Status: DC | PRN

## 2017-12-26 MED ORDER — ALUM & MAG HYDROXIDE-SIMETH 200-200-20 MG/5ML PO SUSP: 30.0000 mL | ORAL | Status: DC | PRN

## 2017-12-26 MED ORDER — ACETAMINOPHEN 325 MG PO TABS: 650.0000 mg | ORAL_TABLET | Freq: Four times a day (QID) | ORAL | Status: DC | PRN

## 2017-12-26 NOTE — BH Assessment (Signed)
Assessment Note  Kara Lawrence is an 42 y.o. female  who present to the ED for a Major Depressive episode. Pt came into the ED via [facility] initially for intoxication and aggressive behaviors. Pt states that she is currently having active SI. When asked by TTS if the pt was suicidal the pt responded "YES!" When asked if she had a plan or means for suicide pt stated, "I don't know".  Pt was angry, irritable, and reluctant to answer assessment questions. During the interview the pt ask to go to the bathroom, upon return the pt answered the rest of the assessment questions with "no". Pt denies using drugs or alcohol and refused to give any additional social hx. Pt reports that she see Dr. Suzie Portela at [facility] but missed her most recent appointment because she was here at [facility].   Pt denies HI A/V H   Diagnosis: Major Depression w/o psychotic features.   Past Medical History:  Past Medical History:  Diagnosis Date  . Bipolar 1 disorder (HCC)   . Manic depression (HCC)   . Seizure [facility])     Past Surgical History:  Procedure Laterality Date  . TUBAL LIGATION Bilateral     Family History: No family history on file.  Social History:  reports that  has never smoked. she has never used smokeless tobacco. She reports that she does not drink alcohol or use drugs.  Additional Social History:  Alcohol / Drug Use Pain Medications: See MAR Prescriptions: See MAR Over the Counter: See MAR History of alcohol / drug use?: No history of alcohol / drug abuse(Pt denies)  CIWA: CIWA-Ar BP: 109/67 Pulse Rate: (!) 102 Nausea and Vomiting: no nausea and no vomiting Tactile Disturbances: none Tremor: no tremor Auditory Disturbances: not present Paroxysmal Sweats: no sweat visible Visual Disturbances: not present Anxiety: mildly anxious Headache, Fullness in Head: none present Agitation: normal activity Orientation and Clouding of Sensorium: oriented and can do serial additions CIWA-Ar Total: 1 COWS:     Allergies: No Known Allergies  Home Medications:  (Not in a hospital admission)  OB/GYN Status:  Patient's last menstrual period was 12/21/2017.  General Assessment Data Location of Assessment: [facility] ED TTS Assessment: In system Is this a Tele or Face-to-Face Assessment?: Face-to-Face Is this an Initial Assessment or a Re-assessment for this encounter?: Initial Assessment Marital status: Single Is patient pregnant?: No Pregnancy Status: No Living Arrangements: Non-relatives/Friends Can pt return to current living arrangement?: Yes Admission Status: Involuntary Is patient capable of signing voluntary admission?: No Referral Source: Psychiatrist Insurance type: None  Medical Screening Exam [facility] Walk-in ONLY) Medical Exam completed: Yes  Crisis Care Plan Living Arrangements: Non-relatives/Friends Legal Guardian: Other:(Self) Name of Psychiatrist: [facility] Name of Therapist: [facility]  Education Status Is patient currently in school?: No Current Grade: n/a Highest grade of school patient has completed: 8th Name of school: [employer] Contact person: n/a  Risk to self with the past 6 months Suicidal Ideation: Yes-Currently Present Has patient been a risk to self within the past 6 months prior to admission? : Yes Suicidal Intent: Yes-Currently Present Has patient had any suicidal intent within the past 6 months prior to admission? : No Is patient at risk for suicide?: Yes Suicidal Plan?: No Has patient had any suicidal plan within the past 6 months prior to admission? : Yes Specify Current Suicidal Plan: (none reported) Access to Means: No Specify Access to Suicidal Means: none reported What has been your use of drugs/alcohol within the last 12 months?:  denied substance use Previous Attempts/Gestures: Yes How many times?: (multiple attempts) Other Self Harm Risks: denied Triggers for Past Attempts: Unknown Intentional Self Injurious Behavior: None Family Suicide History:  No Recent stressful life event(s): Conflict (Comment) Persecutory voices/beliefs?: No Depression: Yes Depression Symptoms: Despondent, Feeling worthless/self pity, Feeling angry/irritable Substance abuse history and/or treatment for substance abuse?: Yes Suicide prevention information given to non-admitted patients: Not applicable  Risk to Others within the past 6 months Homicidal Ideation: No Does patient have any lifetime risk of violence toward others beyond the six months prior to admission? : No Thoughts of Harm to Others: No Current Homicidal Intent: No Current Homicidal Plan: No Access to Homicidal Means: No Identified Victim: none History of harm to others?: No Assessment of Violence: None Noted Violent Behavior Description: denied Does patient have access to weapons?: No Criminal Charges Pending?: No Does patient have a court date: No Is patient on probation?: No  Psychosis Hallucinations: None noted Delusions: None noted  Mental Status Report Appearance/Hygiene: In scrubs Eye Contact: Fair Motor Activity: Agitation Speech: Aggressive, Logical/coherent, Loud Level of Consciousness: Alert, Irritable Mood: Depressed, Angry, Irritable, Worthless, low self-esteem Affect: Angry, Irritable Anxiety Level: Minimal Thought Processes: Coherent, Relevant Judgement: Partial Orientation: Person, Place, Time, Situation, Appropriate for developmental age Obsessive Compulsive Thoughts/Behaviors: None  Cognitive Functioning Concentration: Decreased Memory: Unable to Assess IQ: Average Insight: Poor Impulse Control: Poor Appetite: Fair Weight Loss: 0 Weight Gain: 0 Sleep: Decreased Total Hours of Sleep: 0 Vegetative Symptoms: Unable to Assess  ADLScreening [facility] Assessment Services) Patient able to express need for assistance with ADLs?: Yes  Prior Inpatient Therapy Prior Inpatient Therapy: Yes Prior Therapy Dates: 2017 Prior Therapy Facilty/Provider(s):  [facility] Reason for Treatment: Suicidal  Prior Outpatient Therapy Prior Outpatient Therapy: Yes Prior Therapy Dates: Current Prior Therapy Facilty/Provider(s): [facility] Reason for Treatment: Depression Does patient have an ACCT team?: No Does patient have Intensive In-House Services?  : No Does patient have [facility] services? : No Does patient have P4CC services?: No  ADL Screening (condition at time of admission) Is the patient deaf or have difficulty hearing?: No Does the patient have difficulty seeing, even when wearing glasses/contacts?: No Does the patient have difficulty concentrating, remembering, or making decisions?: No Patient able to express need for assistance with ADLs?: Yes Does the patient have difficulty dressing or bathing?: No Does the patient have difficulty walking or climbing stairs?: No Weakness of Legs: None Weakness of Arms/Hands: None  Home Assistive Devices/Equipment Home Assistive Devices/Equipment: None  Therapy Consults (therapy consults require a physician order) PT Evaluation Needed: No OT Evalulation Needed: No SLP Evaluation Needed: No Abuse/Neglect Assessment (Assessment to be complete while patient is alone) Abuse/Neglect Assessment Can Be Completed: Yes Physical Abuse: Denies Verbal Abuse: Denies Sexual Abuse: Denies Exploitation of patient/patient's resources: Denies Self-Neglect: Denies Values / Beliefs Cultural Requests During Hospitalization: None Spiritual Requests During Hospitalization: None Consults Spiritual Care Consult Needed: No Social Work Consult Needed: No Merchant navy officer (For Healthcare) Does Patient Have a Medical Advance Directive?: (UTA) Would patient like information on creating a medical advance directive?: (UTA)    Additional Information 1:1 In Past 12 Months?: No CIRT Risk: No Elopement Risk: No Does patient have medical clearance?: Yes  Child/Adolescent Assessment Running Away Risk: Denies Bed-Wetting:  Denies Destruction of Property: Denies Cruelty to Animals: Denies Stealing: Denies Rebellious/Defies Authority: Denies Satanic Involvement: Denies Archivist: Denies Problems at [employer]: Denies Gang Involvement: Denies  Disposition:  Disposition Initial Assessment Completed for this Encounter: Yes Disposition of Patient: Inpatient  treatment program Type of inpatient treatment program: Adult  On Site Evaluation by:   Reviewed with Physician:    Diannie Willner D Darnella Zeiter 12/26/2017 12:43 AM

## 2017-12-26 NOTE — BHH Suicide Risk Assessment (Signed)
[facility] Admission Suicide Risk Assessment   Total Time spent with patient: 45 minutes Principal Problem: <principal problem not specified> Diagnosis:   Patient Active Problem List   Diagnosis Date Noted  . Polysubstance abuse (HCC) [F19.10]   . Bipolar 1 disorder (HCC) [F31.9] 12/22/2017  . Personal history of nicotine dependence [Z87.891] 11/23/2015  . Acute hepatic failure [K72.00] 10/25/2015  . 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 Severe episode of recurrent major depressive disorder, without psychotic features (HCC) [F33.2] 10/25/2015  . Hepatic injury [S36.119A] 10/24/2015  . Severe recurrent major depression without psychotic features (HCC) [F33.2] 09/12/2015  . Alcohol use disorder, severe, dependence (HCC) [F10.20] 08/31/2015  . Alcohol withdrawal (HCC) [F10.239] 08/31/2015  . Stimulant use disorder (cocaine) [F15.90] 08/31/2015  . 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 Stimulant abuse (HCC) [F15.10] 08/31/2015   Subjective Data:  42 yo woman with hx of substance abuse and mdd vs bipolar disorder.  She presented to the ER yesterday drunk and UDS positive for cocaine.  She reported suicideal ideation, increased depression.  Stressors - financial issues, fighting with husband.  She was at [facility] intoxicated, c/o of SI and low mood.  She was given her a weeks worth of her meds and asked to f/u with Dr. Marguerite Olea.  However pt states her appt with Dr. Carman Ching was on 1/8 so she missed it being at the hospital (?? Not sure if this true).  Anyways she presented to the hospital again yesterday.  Admitted due to SI, for safety and stabilization, help getting back on meds and getting sober.   Of note pt is a brief historian and often vague despite requests for clarification.  Today patient states she is "fine, I ain't suicidal no more."  She states "a  long time ago" (cannot give more specific timeline, but sounds like years) she did try commit suicide with pills.  She states she comes from a family of drinkers and doesn't know any other way.  She is interested in sobriety.  She states she has been depressed for the past couple of months, endorses low mood and motivation.  No homicidal ideation.  She denies withdrawal symptoms at this time.  States she drinks everyday "a normal amount."  Counseled about alcohol withdrawal.  Denies symptoms at this time.        Continued Clinical Symptoms:  Alcohol Use Disorder Identification Test Final Score (AUDIT): 15 The "Alcohol Use Disorders Identification Test", Guidelines for Use in Primary Care, Second Edition.  World Science writer [facility]). Score between 0-7:  no or low risk or alcohol related problems. Score between 8-15:  moderate risk of alcohol related problems. Score between 16-19:  high risk of alcohol related problems. Score 20 or above:  warrants further diagnostic evaluation for alcohol dependence and treatment.   CLINICAL FACTORS:   Depression:   Aggression Comorbid alcohol abuse/dependence Impulsivity Alcohol/Substance Abuse/Dependencies Epilepsy Unstable or Poor Therapeutic Relationship Previous Psychiatric Diagnoses and Treatments Medical Diagnoses and Treatments/Surgeries  Musculoskeletal: Strength & Muscle Tone: within normal limits Gait & Station: normal Patient leans: N/A  Psychiatric Specialty Exam: Physical Exam  Constitutional: She appears well-developed.  HENT:  Head:Normocephalicand atraumatic.  Eyes:Conjunctivaeare normal. Pupils are equal, round, and reactive to light.  Neck:Normal range of motion.  Cardiovascular:Regular rhythmand normal heart sounds.  Respiratory:Effort normal. Norespiratory distress.  EA:VWUJ.  Musculoskeletal:Normal range of motion.  Neurological: She isalert.  Skin: Skin iswarmand dry.  Review of Systems   Constitutional: Negative.   HENT: Negative.   Eyes: Negative.   Respiratory: Negative for cough, shortness of breath and wheezing.   Cardiovascular: Negative for chest pain.  Gastrointestinal: Negative for abdominal pain, constipation, diarrhea, heartburn, nausea and vomiting.  Genitourinary: Negative.   Musculoskeletal: Positive for joint pain and myalgias.  Skin: Negative for itching and rash.  Neurological: Positive for seizures. Negative for dizziness and headaches.  Endo/Heme/Allergies: Negative.   Psychiatric/Behavioral: Positive for depression. Negative for hallucinations.    Blood pressure 122/75, pulse 95, temperature 98.1 F (36.7 C), temperature source Oral, resp. rate 18, height 5' 2.99" (1.6 m), weight 60.3 kg (133 lb), last menstrual period 12/21/2017, SpO2 100 %.Body mass index is 23.57 kg/m.  General Appearance: Disheveled  Eye Contact:  Good  Speech:  Normal Rate  Volume:  Normal  Mood:  Anxious  Affect:  Congruent  Thought Process:  Coherent  Orientation:  Full (Time, Place, and Person)  Thought Content:  Logical  Suicidal Thoughts:  denies currently  Homicidal Thoughts:  No  Memory:  Immediate;   Good Recent;   Good Remote;   Good  Judgement:  Poor  Insight:  Lacking  Psychomotor Activity:  Normal  Concentration:  Concentration: Good and Attention Span: Fair  Recall:  Good  Fund of Knowledge:  Fair  Language:  Good  Akathisia:  No  Handed:  Right  AIMS (if indicated):     Assets:  Communication Skills Leisure Time  ADL's:  Intact  Cognition:  WNL  Sleep:       COGNITIVE FEATURES THAT CONTRIBUTE TO RISK:  Closed-mindedness, Loss of executive function and Thought constriction (tunnel vision)    SUICIDE RISK:   Moderate:  Frequent suicidal ideation with limited intensity, and duration, some specificity in terms of plans, no associated intent, good self-control, limited dysphoria/symptomatology, some risk factors present, and identifiable  protective factors, including available and accessible social support. Moderate risk for suicide due to hx of attempt, substance use, mental illness.    PLAN OF CARE: Admit to inpt unit for safety, stabilization and detox.  Refer to h&p for specific plan and med management.  I certify that inpatient services furnished can reasonably be expected to improve the patient's condition.   Cindee LameLauren M Mahlia Fernando, MD 12/26/2017, 7:04 PM

## 2017-12-26 NOTE — Progress Notes (Signed)
Patient ID: Kara MallickKischelle Shawanna Lawrence, female   DOB: [birth year], 42 y.o.   MRN: [id number]  Received [place] from the ED alert and oriented x4. She is irritable, tired and verbalized she wants to be left alone. She refused to answer any questions, but allowed this writer to check her B/P, Ht,WT and the skin assessment was completed with Nurse Demetria. She was oriented to her new environment and lunch was ordered for her.
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
## 2017-12-26 NOTE — ED Provider Notes (Signed)
-----------------------------------------   7:20 AM on 12/26/2017 -----------------------------------------   Blood pressure 136/85, pulse 82, temperature 98.1 F (36.7 C), temperature source Oral, resp. rate 18, weight 58.5 kg (129 lb), last menstrual period 12/21/2017, SpO2 99 %.  The patient had no acute events since last update.  Calm and cooperative at this time.  Disposition is pending Psychiatry/Behavioral Medicine team recommendations.     Irean HongSung, Jade J, MD 12/26/17 [phone]

## 2017-12-26 NOTE — Tx Team (Signed)
Initial Treatment Plan 12/26/2017 1:05 PM Honi Hardie ShackletonShawanna Lorek ZOX:[id number]    PATIENT STRESSORS: Substance abuse Other: confrontation with Boyfriend   PATIENT STRENGTHS: Ability for insight Capable of independent living Communication skills Supportive family/friends   PATIENT IDENTIFIED PROBLEMS:                      DISCHARGE CRITERIA:  Ability to meet basic life and health needs Adequate post-discharge living arrangements Improved stabilization in mood, thinking, and/or behavior  PRELIMINARY DISCHARGE PLAN: Return to previous living arrangement  PATIENT/FAMILY INVOLVEMENT: This treatment plan has been presented to and reviewed with the patient, Milus MallickKischelle Shawanna Olveda.  The patient has been given the opportunity to ask questions and make suggestions.  Rex KrasJoanne  Natlie Asfour, RN 12/26/2017, 1:05 PM

## 2017-12-27 ENCOUNTER — Encounter: Payer: Self-pay | Admitting: Psychiatry

## 2017-12-27 NOTE — Plan of Care (Signed)
Denies SI/HI/AVH.  Affect constricted.   Isolates to room.  Reluctant to talk about what brought her into the hospital.  Medication compliant.  No inappropriate behavior noted.  Support offered.  Safety rounds maintained.

## 2017-12-27 NOTE — Plan of Care (Signed)
Pt. This evening compliant with medications. Pt denies SI/HI. Pt verbally contracts for safety and states she can remain safe while on the unit. Pt. Reports feeling, "better" today.

## 2017-12-27 NOTE — [facility] Group Notes (Signed)
LCSW Group Therapy Note 12/27/2017 1:15pm  Type of Therapy and Topic: Group Therapy: Feelings Around Returning Home & Establishing a Supportive Framework and Supporting Oneself When Supports Not Available  Participation Level: Did Not Attend  Description of Group:  Patients first processed thoughts and feelings about upcoming discharge. These included fears of upcoming changes, lack of change, new living environments, judgements and expectations from others and overall stigma of mental health issues. The group then discussed the definition of a supportive framework, what that looks and feels like, and how do to discern it from an unhealthy non-supportive network. The group identified different types of supports as well as what to do when your family/friends are less than helpful or unavailable  Therapeutic Goals  1. Patient will identify one healthy supportive network that they can use at discharge. 2. Patient will identify one factor of a supportive framework and how to tell it from an unhealthy network. 3. Patient able to identify one coping skill to use when they do not have positive supports from others. 4. Patient will demonstrate ability to communicate their needs through discussion and/or role plays.  Summary of Patient Progress:    Therapeutic Modalities Cognitive Behavioral Therapy Motivational Interviewing   Kara Lawrence  CUEBAS-COLON, LCSW 12/27/2017 12:20 PM    

## 2017-12-27 NOTE — Progress Notes (Signed)
[facility]  12/27/2017 9:01 PM Kara Hardie ShackletonShawanna Lawrence  MRN:  [id number] Subjective:   Phlebotomy could not draw pt's blood this morning, couldn't find a vein.  Kara Lawrence is laying peacefully in bed.  She states she is just "eating, sleeping and resting."  She denies s/s of withdrawal.  She then starts talking about how phlebotomy couldn't find a vein, tells the story about 3x, that they just couldn't get any blood.  No si/hi/avh.    Principal Problem: Severe recurrent major depression without psychotic features (HCC) Diagnosis:   Patient Active Problem List   Diagnosis Date Noted  . Polysubstance abuse (HCC) [F19.10]   . Bipolar 1 disorder (HCC) [F31.9] 12/22/2017  . Personal history of nicotine dependence [Z87.891] 11/23/2015  . Acute hepatic failure [K72.00] 10/25/2015  . 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 Severe episode of recurrent major depressive disorder, without psychotic features (HCC) [F33.2] 10/25/2015  . Hepatic injury [S36.119A] 10/24/2015  . Severe recurrent major depression without psychotic features (HCC) [F33.2] 09/12/2015  . Alcohol use disorder, severe, dependence (HCC) [F10.20] 08/31/2015  . Alcohol withdrawal (HCC) [F10.239] 08/31/2015  . Stimulant use disorder (cocaine) [F15.90] 08/31/2015  . 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 Stimulant abuse (HCC) [F15.10] 08/31/2015   Total Time spent with patient: 20 minutes  Past Psychiatric History: see h&p  Past Medical History:  Past Medical History:  Diagnosis Date  . Bipolar 1 disorder (HCC)   . Manic depression (HCC)   . Seizure [facility](HCC)     Past Surgical History:  Procedure Laterality Date  . TUBAL LIGATION Bilateral    Family History: History reviewed. No pertinent family history. Family Psychiatric  History: see h&p Social History:   Social History   Substance and Sexual Activity  Alcohol Use No   Comment: pt smells strongly of ETOH     Social History   Substance and Sexual Activity  Drug Use Yes  . Types: "Crack" cocaine, Benzodiazepines    Social History   Socioeconomic History  . Marital status: Single    Spouse name: None  . Number of children: None  . Years of education: None  . Highest education level: None  Social Needs  . Financial resource strain: None  . Food insecurity - worry: None  . Food insecurity - inability: None  . Transportation needs - medical: None  . Transportation needs - non-medical: None  Occupational History  . None  Tobacco Use  . Smoking status: Never Smoker  . Smokeless tobacco: Never Used  Substance and Sexual Activity  . Alcohol use: No    Comment: pt smells strongly of ETOH  . Drug use: Yes    Types: "Crack" cocaine, Benzodiazepines  . Sexual activity: Yes    Birth control/protection: None  Other Topics Concern  . None  Social History Narrative  . None   Additional Social History:                         Sleep: Good  Appetite:  Good  Current Medications: Current Facility-Administered Medications  Medication Dose Route Frequency Provider Last Rate Last Dose  . acetaminophen (TYLENOL) tablet 650 mg  650 mg Oral Q6H PRN Clapacs, John T, MD      . alum & mag hydroxide-simeth (MAALOX/MYLANTA) 200-200-20 MG/5ML suspension 30 mL  30 mL Oral Q4H PRN Clapacs, Jackquline DenmarkJohn T, MD      .  divalproex (DEPAKOTE) DR tablet 500 mg  500 mg Oral Q12H Clapacs, Jackquline Denmark, MD   500 mg at 12/27/17 0915  . folic acid (FOLVITE) tablet 1 mg  1 mg Oral Daily Clapacs, Jackquline Denmark, MD   1 mg at 12/27/17 0914  . Influenza vac split quadrivalent PF (FLUARIX) injection 0.5 mL  0.5 mL Intramuscular Tomorrow-1000 Pucilowska, Jolanta B, MD      . LORazepam (ATIVAN) tablet 1 mg  1 mg Oral Q6H PRN Clapacs, Jackquline Denmark, MD       Or  . LORazepam (ATIVAN) injection 1 mg  1 mg Intravenous Q6H PRN  Clapacs, John T, MD      . magnesium hydroxide (MILK OF MAGNESIA) suspension 30 mL  30 mL Oral Daily PRN Clapacs, John T, MD      . multivitamin with minerals tablet 1 tablet  1 tablet Oral Daily Clapacs, Jackquline Denmark, MD   1 tablet at 12/27/17 0915  . thiamine (VITAMIN B-1) tablet 100 mg  100 mg Oral Daily Clapacs, John T, MD   100 mg at 12/27/17 0915   Or  . thiamine (B-1) injection 100 mg  100 mg Intravenous Daily Clapacs, John T, MD      . traZODone (DESYREL) tablet 150 mg  150 mg Oral QHS Clapacs, Jackquline Denmark, MD   150 mg at 12/26/17 2122    Lab Results: No results found for this or any previous visit (from the past 48 hour(s)).  Blood Alcohol level:  Lab Results  Component Value Date   ETH 342 (HH) 12/24/2017   ETH 309 (HH) 12/21/2017    Metabolic Disorder Labs: No results found for: HGBA1C, MPG No results found for: PROLACTIN No results found for: CHOL, TRIG, HDL, CHOLHDL, VLDL, LDLCALC  Physical Findings: AIMS: Facial and Oral Movements Muscles of Facial Expression: None, normal Lips and Perioral Area: None, normal Jaw: None, normal Tongue: None, normal,Extremity Movements Upper (arms, wrists, hands, fingers): None, normal Lower (legs, knees, ankles, toes): None, normal, Trunk Movements Neck, shoulders, hips: None, normal, Overall Severity Severity of abnormal movements (highest score from questions above): None, normal Incapacitation due to abnormal movements: None, normal Patient's awareness of abnormal movements (rate only patient's report): No Awareness, Dental Status Current problems with teeth and/or dentures?: No Does patient usually wear dentures?: No  CIWA:  CIWA-Ar Total: 2 COWS:     Musculoskeletal: Strength & Muscle Tone: within normal limits Gait & Station: normal Patient leans: N/A  Psychiatric Specialty Exam: Physical Exam  Review of Systems  Musculoskeletal: Positive for joint pain and myalgias.  Neurological: Positive for seizures.   Psychiatric/Behavioral: Positive for depression.    Blood pressure 123/72, pulse 92, temperature 98.1 F (36.7 C), temperature source Oral, resp. rate 18, height 5' 2.99" (1.6 m), weight 60.3 kg (133 lb), last menstrual period 12/21/2017, SpO2 99 %.Body mass index is 23.57 kg/m.  General Appearance: Casual  Eye Contact:  Good  Speech:  Normal Rate  Volume:  Normal  Mood:  Euthymic  Affect:  Congruent  Thought Process:  Coherent   Orientation:  Full (Time, Place, and Person)  Thought Content:  Logical and Rumination mild rumination   Suicidal Thoughts:  No  Homicidal Thoughts:  No  Memory:  Immediate;   Good Recent;   Good Remote;   Good  Judgement:  Poor  Insight:  Lacking  Psychomotor Activity:  Normal  Concentration:  Concentration: Good and Attention Span: Good  Recall:  Good  Fund of Knowledge:  Fair  Language:  Good  Akathisia:  No  Handed:  Right  AIMS (if indicated):     Assets:  Communication Skills Leisure Time  ADL's:  Intact  Cognition:  WNL  Sleep:       Treatment Plan Summary:  42 yo with substance abuse and mood disorder (mdd, r/o bipolar d/o).  Patient's biggest problem are are prolonged alcohol and cocaine abuse.  Also inconsistent housing, no car, finances are tight.  Will resume home medications and encourage pt to pursue sobriety.  Moderate risk for suicide due to hx of attempt, substance use, mental illness.  Daily contact with patient to assess and evaluate symptoms and progress in treatment, Medication management and Plan --   #polysubstance abuse -CIWA with lorazepam  #mdd r/o bipolar -continue depakote (for seizures and mood stabilization) 500mg  q12 hours -continue trazodone 150mg  prn sleep    The best thing for pt is sobriety as the severity of her substance use is limiting the effectivness of medications.              15 min checks  Physician Treatment Plan for Primary Diagnosis: substance abuse Long Term Goal(s): Improvement in  symptoms so as ready for discharge  Short Term Goals: Ability to identify triggers associated with substance abuse/mental health issues will improve  Physician Treatment Plan for Secondary Diagnosis: Active Problems:   Severe recurrent major depression without psychotic features (HCC)  I certify that inpatient services furnished can reasonably be expected to improve the patient's condition.      Cindee Lame, MD 12/27/2017, 9:01 PM

## 2017-12-27 NOTE — Progress Notes (Signed)
D:Pt denies SI/HI/AVH. Pt is pleasant and cooperative upon engagement with this Clinical research associatewriter. Pt. Has no complaints this evening. Pt. Isolative and withdrawn sleeping a majority of the shift. Pt did get up for snack. Pt. Reports feeling, "better" today. Pt. Reports eating and sleeping, "good".   A: Q x 15 minute observation checks were completed for safety. Patient was provided with education. Patient was given scheduled medications. Patient  was encourage to attend groups, participate in unit activities and continue with plan of care.   R:Patient is complaint with medication and unit procedures, but did not attend groups.             Patient slept for Estimated Hours of 7.45; Precautionary checks every 15 minutes for safety maintained, room free of safety hazards, patient sustains no injury or falls during this shift.

## 2017-12-27 NOTE — [facility] Group Notes (Signed)
[facility] Group Notes:  (Nursing/MHT/Case Management/Adjunct)  Date:  12/27/2017  Time:  5:57 AM  Type of Therapy:  Psychoeducational Skills  Participation Level:  Did Not Attend   Summary of Progress/Problems:  Chancy MilroyLaquanda Y Nikole Swartzentruber 12/27/2017, 5:57 AM

## 2017-12-27 NOTE — [facility] Counselor (Signed)
Adult Comprehensive Assessment  Patient ID: Kara Lawrence, female   DOB: Jan 06, 1976, 42 y.o.   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, petroleum / Lack of resources (include bankruptcy): pt states "not good" Housing / Lack of housing: pt states "got somewhere to go when I leave" Physical health (include injuries & life threatening diseases): liver problems Social relationships: pt reports "I don't have friends and I don't need any" Substance abuse: cocaine, ETOH, THC Bereavement / Loss: Aunt in December 2018  Living/Environment/Situation:  Living Arrangements: Other (Comment)(pt reports she resides with her grandmother and her boyfriend) Living conditions (as described by patient or guardian): pt reports that everything is fine How long has patient lived in current situation?: pt did not answer question What is atmosphere in current home: Supportive  Family History:  Marital status: Long term relationship Long term relationship, how long?: 10 years What types of issues is patient dealing with in the relationship?: pt states "problems like anyone else" Are you sexually active?: Yes What is your sexual orientation?: straight Has your sexual activity been affected by drugs, alcohol, medication, or emotional stress?: no Does patient have children?: Yes How many children?: 4 How is patient's relationship with their children?: pt reports that all her children are adults now and she does not have a relationship with them  Childhood History:  By whom was/is the patient raised?: Mother Description of patient's relationship with caregiver when they were a child: good Patient's description of current relationship with people who raised him/her:  good How were you disciplined when you got in trouble as a child/adolescent?: pt did not answer question Does patient have siblings?: Yes Number of Siblings: 1 Description of patient's current relationship with siblings: pt reports she has one sister from her mother's side and she did not want to talk about her siblings from her father's side Did patient suffer any verbal/emotional/physical/sexual abuse as a child?: No Did patient suffer from severe childhood neglect?: No Has patient ever been sexually abused/assaulted/raped as an adolescent or adult?: No Was the patient ever a victim of a crime or a disaster?: No Witnessed domestic violence?: No Has patient been effected by domestic violence as an adult?: No  Education:  Highest grade of school patient has completed: 8th Currently a student?: No Name of school: [employer] Learning disability?: Yes What learning problems does patient have?: pt did not provide information about her learning disability  Employment/Work Situation:   Employment situation: Unemployed Patient's job has been impacted by current illness: Yes Describe how patient's job has been impacted: ETOH What is the longest time patient has a held a job?: pt reports that she has not worked for over 20 years, she cannot remember  Where was the patient employed at that time?: pt reports she cannot remember Has patient ever been in the [employer]?: No Has patient ever served in combat?: No Did You Receive Any Psychiatric Treatment/Services While in Equities trader?: No Are There Guns or Other Weapons in Your Home?: No  Financial Resources:   Financial resources: Income from spouse Does patient have a representative payee or guardian?: No  Alcohol/Substance Abuse:   What has been your use of drugs/alcohol within the last 12 months?: Alcohol daily If attempted suicide, did drugs/alcohol play a role in this?: Yes  Alcohol/Substance Abuse Treatment Hx: Past Tx, Inpatient,  Past Tx, Outpatient, Past detox Has alcohol/substance abuse ever caused legal problems?: No  Social Support System:   Patient's Community Support System: Fair Development worker, communityDescribe Community Support System: mom, boyfriend Type of faith/religion: Baptist How does patient's faith help to cope with current illness?: praying  Leisure/Recreation:   Leisure and Hobbies: cook  Strengths/Needs:   What things does the patient do well?: cook In what areas does patient struggle / problems for patient: communicating  Discharge Plan:   Does patient have access to transportation?: Yes(sister) Will patient be returning to same living situation after discharge?: Yes Currently receiving community mental health services: Yes (From Whom)([facility]) If no, would patient like referral for services when discharged?: No Does patient have financial barriers related to discharge medications?: No  Summary/Recommendations:   Summary and Recommendations (to be completed by the evaluator): Patient is a 42 year old female admitted with history of substance abuse and major depressive disorder vs bipolar disorder, suicidal ideation, increased depression. Patient will benefit from crisis stabilization, medication evaluation, group therapy and psychoeducation. In addition to case management for discharge planning. At discharge it is recommended that patient adhere to the established discharge plan and continue treatment.   Kara Stanard  Lawrence. 12/27/2017

## 2017-12-27 NOTE — Progress Notes (Signed)
D:Pt denies SI/HI/AVH. Pt is pleasant and cooperative upon engagement with this Clinical research associatewriter. Pt. Has no complaints this evening. Pt. Isolative and withdrawn, sleeping a majority of the shift. Pt did get up for snack and medications. Pt. Reports feeling, "better" today.Pt. Reports eating and sleeping, "good". Pt. Reports lab was not able to get blood this morning.   A: Q x 15 minute observation checks were completed for safety. Patient was provided with education. Patient was given scheduled medications. Patient was encourage to attend groups, participate in unit activities and continue with plan of care.   R:Patient is complaint with medication and unit procedures, but did not attend groups.             Patient slept for Estimated Hours of 7.45; Precautionary checks every 15 minutes for safety maintained, room free of safety hazards, patient sustains no injury or falls during this shift.

## 2017-12-27 NOTE — Plan of Care (Signed)
Pt. This evening compliant with medications. Pt denies SI/HI. Pt verbally contracts for safety and states she can remain safe while on the unit. Pt. Reports feeling, "better" today then she did yesterday. Pt. Able to make informed decision in her plan of care.

## 2017-12-28 DIAGNOSIS — F332 Major depressive disorder, recurrent severe without psychotic features: Principal | ICD-10-CM

## 2017-12-28 LAB — VALPROIC ACID LEVEL: VALPROIC ACID LVL: 98 ug/mL (ref 50.0–100.0)

## 2017-12-28 MED ORDER — TRAZODONE HCL 150 MG PO TABS: 150.0000 mg | ORAL_TABLET | Freq: Every day | ORAL | 2 refills | Status: DC

## 2017-12-28 MED ORDER — PANTOPRAZOLE SODIUM 40 MG PO TBEC: 40.0000 mg | DELAYED_RELEASE_TABLET | Freq: Every day | ORAL | 6 refills | Status: DC

## 2017-12-28 MED ORDER — DIVALPROEX SODIUM ER 500 MG PO TB24: 500.0000 mg | ORAL_TABLET | Freq: Two times a day (BID) | ORAL | 2 refills | Status: DC

## 2017-12-28 NOTE — [facility] Group Notes (Signed)
12/28/2017  Time: 0930   Type of Therapy and Topic:  Group Therapy:  Overcoming Obstacles   Participation Level:  Did Not Attend   Description of Group:   In this group patients will be encouraged to explore what they see as obstacles to their own wellness and recovery. They will be guided to discuss their thoughts, feelings, and behaviors related to these obstacles. The group will process together ways to cope with barriers, with attention given to specific choices patients can make. Each patient will be challenged to identify changes they are motivated to make in order to overcome their obstacles. This group will be process-oriented, with patients participating in exploration of their own experiences, giving and receiving support, and processing challenge from other group members.   Therapeutic Goals: 1. Patient will identify personal and current obstacles as they relate to admission. 2. Patient will identify barriers that currently interfere with their wellness or overcoming obstacles.  3. Patient will identify feelings, thought process and behaviors related to these barriers. 4. Patient will identify two changes they are willing to make to overcome these obstacles:      Summary of Patient Progress  Pt was invited to attend group but chose not to attend. CSW will continue to encourage pt to attend group throughout their admission.     Therapeutic Modalities:   Cognitive Behavioral Therapy Solution Focused Therapy Motivational Interviewing Relapse Prevention Therapy  Heidi DachKelsey Ray Gervasi, MSW, LCSW 12/28/2017 10:40 AM

## 2017-12-28 NOTE — [facility] Group Notes (Signed)
[facility] Group Notes:  (Nursing/MHT/Case Management/Adjunct)  Date:  12/28/2017  Time:  11:38 PM  Type of Therapy:  Group Therapy  Participation Level:  Did Not Attend  Participation Quality Summary of Progress/Problems:  Kara Lawrence 12/28/2017, 11:38 PM

## 2017-12-28 NOTE — Plan of Care (Signed)
  Progressing Coping: Ability to cope will improve 12/28/2017 2003 - Progressing by Lelan PonsAriwodo, Lillee Mooneyhan, RN Health Behavior/Discharge Planning: Identification of resources available to assist in meeting health care needs will improve 12/28/2017 2003 - Progressing by Lelan PonsAriwodo, Karelyn Brisby, RN Self-Concept: Ability to disclose and discuss suicidal ideas will improve 12/28/2017 2003 - Progressing by Lelan PonsAriwodo, Raynette Arras, RN Safety: Ability to remain free from injury will improve 12/28/2017 2003 - Progressing by Lelan PonsAriwodo, Giovanni Bath, RN

## 2017-12-28 NOTE — Progress Notes (Addendum)
[facility] MD Progress Note  12/28/2017 1:35 PM Kara Lawrence  MRN:  [id number]  Subjective:   Kara Lawrence met with treatment team today. She feels better and denies suicidal ideation. She is aware that drinking and poor compliance with prescribed medications are her major problem. She tolerates medications well. Nop symptoms of alcohol withdrawal. Discharge tomorrow.  Treatment plan. She was restarted on Depakote that is prescribed both for mood stabilization and seizures.   Social/disposition. She lives with elderly grandmother. She will follow up with [facility] on 12/30/2017 for medication management and SA IOP program.  Principal Problem: Severe recurrent major depression without psychotic features ([place]) Diagnosis:   Patient Active Problem List   Diagnosis Date Noted  . Severe recurrent major depression without psychotic features ([place]) [F33.2] 09/12/2015    Priority: High  . Polysubstance abuse ([place]) [F19.10]   . Bipolar 1 disorder ([place]) [F31.9] 12/22/2017  . Personal history of nicotine dependence [Z87.891] 11/23/2015  . Acute hepatic failure [K72.00] 10/25/2015  . Disseminated intravascular coagulation ([place]) [D65] 10/25/2015  . 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 Severe episode of recurrent major depressive disorder, without psychotic features ([place]) [F33.2] 10/25/2015  . Hepatic injury [S36.119A] 10/24/2015  . Alcohol use disorder, severe, dependence ([place]) [F10.20] 08/31/2015  . Alcohol withdrawal ([place]) [F10.239] 08/31/2015  . Stimulant use disorder (cocaine) [F15.90] 08/31/2015  . 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 Stimulant abuse ([place]) [F15.10] 08/31/2015   Total Time spent with patient: 30 minutes  Past Psychiatric History: depression, mood instability, alcoholism  Past Medical History:  Past Medical History:  Diagnosis Date  . Bipolar 1 disorder ([place])   . Manic depression ([place])   .  Seizure [facility])     Past Surgical History:  Procedure Laterality Date  . TUBAL LIGATION Bilateral    Family History: History reviewed. No pertinent family history. Family Psychiatric  History: none reported Social History:  Social History   Substance and Sexual Activity  Alcohol Use No   Comment: pt smells strongly of ETOH     Social History   Substance and Sexual Activity  Drug Use Yes  . Types: "Crack" cocaine, Benzodiazepines    Social History   Socioeconomic History  . Marital status: Single    Spouse name: None  . Number of children: None  . Years of education: None  . Highest education level: None  Social Needs  . Financial resource strain: None  . Food insecurity - worry: None  . Food insecurity - inability: None  . Transportation needs - medical: None  . Transportation needs - non-medical: None  Occupational History  . None  Tobacco Use  . Smoking status: Never Smoker  . Smokeless tobacco: Never Used  Substance and Sexual Activity  . Alcohol use: No    Comment: pt smells strongly of ETOH  . Drug use: Yes    Types: "Crack" cocaine, Benzodiazepines  . Sexual activity: Yes    Birth control/protection: None  Other Topics Concern  . None  Social History Narrative  . None   Additional Social History:                         Sleep: Fair  Appetite:  Fair  Current Medications: Current Facility-Administered Medications  Medication Dose Route Frequency Provider Last Rate Last Dose  . acetaminophen (TYLENOL) tablet 650 mg  650 mg Oral Q6H PRN Clapacs, Madie Reno, MD      .  alum & mag hydroxide-simeth (MAALOX/MYLANTA) 200-200-20 MG/5ML suspension 30 mL  30 mL Oral Q4H PRN Clapacs, John T, MD      . divalproex (DEPAKOTE) DR tablet 500 mg  500 mg Oral Q12H Clapacs, Madie Reno, MD   500 mg at 12/28/17 0804  . folic acid (FOLVITE) tablet 1 mg  1 mg Oral Daily Clapacs, Madie Reno, MD   1 mg at 12/28/17 0804  . Influenza vac split quadrivalent PF (FLUARIX) injection  0.5 mL  0.5 mL Intramuscular Tomorrow-1000 Aitanna Haubner B, MD      . LORazepam (ATIVAN) tablet 1 mg  1 mg Oral Q6H PRN Clapacs, Madie Reno, MD       Or  . LORazepam (ATIVAN) injection 1 mg  1 mg Intravenous Q6H PRN Clapacs, John T, MD      . magnesium hydroxide (MILK OF MAGNESIA) suspension 30 mL  30 mL Oral Daily PRN Clapacs, John T, MD      . multivitamin with minerals tablet 1 tablet  1 tablet Oral Daily Clapacs, Madie Reno, MD   1 tablet at 12/28/17 0803  . thiamine (VITAMIN B-1) tablet 100 mg  100 mg Oral Daily Clapacs, John T, MD   100 mg at 12/28/17 0803   Or  . thiamine (B-1) injection 100 mg  100 mg Intravenous Daily Clapacs, Madie Reno, MD      . traZODone (DESYREL) tablet 150 mg  150 mg Oral QHS Clapacs, Madie Reno, MD   150 mg at 12/27/17 2114    Lab Results:  Results for orders placed or performed during the hospital encounter of 12/26/17 (from the past 48 hour(s))  Valproic acid level     Status: None   Collection Time: 12/28/17  7:38 AM  Result Value Ref Range   Valproic Acid Lvl 98 50.0 - 100.0 ug/mL    Comment: Performed at [facility], [address] [place], [place] [address]    Blood Alcohol level:  Lab Results  Component Value Date   ETH 342 ([place]) 12/24/2017   ETH 309 ([facility]) 80/32/1224    Metabolic Disorder Labs: No results found for: HGBA1C, MPG No results found for: PROLACTIN No results found for: CHOL, TRIG, HDL, CHOLHDL, VLDL, LDLCALC  Physical Findings: AIMS: Facial and Oral Movements Muscles of Facial Expression: None, normal Lips and Perioral Area: None, normal Jaw: None, normal Tongue: None, normal,Extremity Movements Upper (arms, wrists, hands, fingers): None, normal Lower (legs, knees, ankles, toes): None, normal, Trunk Movements Neck, shoulders, hips: None, normal, Overall Severity Severity of abnormal movements (highest score from questions above): None, normal Incapacitation due to abnormal movements: None, normal Patient's awareness of  abnormal movements (rate only patient's report): No Awareness, Dental Status Current problems with teeth and/or dentures?: No Does patient usually wear dentures?: No  CIWA:  CIWA-Ar Total: 0 COWS:     Musculoskeletal: Strength & Muscle Tone: within normal limits Gait & Station: normal Patient leans: Right  Psychiatric Specialty Exam: Physical Exam  Nursing note and vitals reviewed. Psychiatric: She has a normal mood and affect. Her speech is normal and behavior is normal. Thought content normal. Cognition and memory are normal. She expresses impulsivity.    Review of Systems  Neurological: Negative.   Psychiatric/Behavioral: Positive for substance abuse.  All other systems reviewed and are negative.   Blood pressure 116/83, pulse 85, temperature 98.1 F (36.7 C), temperature source Oral, resp. rate 16, height 5' 2.99" (1.6 m), weight 60.3 kg (133 lb), last menstrual period 12/21/2017,  SpO2 99 %.Body mass index is 23.57 kg/m.  General Appearance: Casual  Eye Contact:  Good  Speech:  Clear and Coherent  Volume:  Normal  Mood:  Depressed  Affect:  Blunt  Thought Process:  Goal Directed and Descriptions of Associations: Intact  Orientation:  Full (Time, Place, and Person)  Thought Content:  WDL  Suicidal Thoughts:  No  Homicidal Thoughts:  No  Memory:  Immediate;   Fair Recent;   Fair Remote;   Fair  Judgement:  Poor  Insight:  Lacking  Psychomotor Activity:  Normal  Concentration:  Concentration: Fair and Attention Span: Fair  Recall:  [employer] of Knowledge:  Fair  Language:  Fair  Akathisia:  No  Handed:  Right  AIMS (if indicated):     Assets:  Communication Skills Desire for Improvement Housing Physical Health Resilience Social Support  ADL's:  Intact  Cognition:  WNL  Sleep:        Treatment Plan Summary: Daily contact with patient to assess and evaluate symptoms and progress in treatment and Medication management   Kara Lawrence is a 42 year old female  with a history fo mood instability and substance abuse admitted for suicidal threats while drunk in the context of social stressors. Patient's biggest problem is alcohol and cocaine abuse as well as inconsistent housing, no car, finances are tight.  Will resume home medications and encourage pt to pursue sobriety.  Moderate risk for suicide due to hx of attempt, substance use, mental illness.  #Suicidal ideation, resolved -patient able to contract for safety in the hospital  #Mood  -continue depakote (for seizures and mood stabilization) 576m q12 hours, VPA level 100 on 1/14 -continue trazodone 1516mprn sleep   #Alcohol detox -CIWA with lorazepam, VS are stable -patient minimizes problems and declines residential treatment  #Disposition -discharge with grandmother -follow up with [facility]      JoOrson SlickMD 12/28/2017, 1:35 PM

## 2017-12-28 NOTE — Progress Notes (Signed)
Patient stayed in bed most of the shift.Denies SI,HI and AVH.No withdrawal symptome noted.Compliant with medications.Did not attend groups.States "I don't feel like going for groups."Appetite and energy level good.Support and encouragement given.

## 2017-12-28 NOTE — [facility] Suicide Risk Assessment (Signed)
[facility] INPATIENT:  Family/Significant Other Suicide Prevention Education  Suicide Prevention Education:  Contact Attempts: Kara HongDennis Lawrence, [phone], (name of family member/significant other) has been identified by the patient as the family member/significant other with whom the patient will be residing, and identified as the person(s) who will aid the patient in the event of a mental health crisis.  With written consent from the patient, two attempts were made to provide suicide prevention education, prior to and/or following the patient's discharge.  We were unsuccessful in providing suicide prevention education.  A suicide education pamphlet was given to the patient to share with family/significant other.  Date and time of first attempt:12/28/2017, 4:30pm Date and time of second attempt:  Kara MacSara P Kara Genrich, LCSW 12/28/2017, 4:35 PM

## 2017-12-28 NOTE — Tx Team (Signed)
Interdisciplinary Treatment and Diagnostic Plan Update  12/28/2017 Time of Session: 10:45am Kara Lawrence MRN: [id number]  Principal Diagnosis: Severe recurrent major depression without psychotic features [facility](HCC)  Secondary Diagnoses: Principal Problem:   Severe recurrent major depression without psychotic features (HCC) Active Problems:   Alcohol use disorder, severe, dependence (HCC)   Stimulant use disorder (cocaine)   Polysubstance abuse (HCC)   Current Medications:  Current Facility-Administered Medications  Medication Dose Route Frequency Provider Last Rate Last Dose  . acetaminophen (TYLENOL) tablet 650 mg  650 mg Oral Q6H PRN Clapacs, John T, MD      . alum & mag hydroxide-simeth (MAALOX/MYLANTA) 200-200-20 MG/5ML suspension 30 mL  30 mL Oral Q4H PRN Clapacs, John T, MD      . divalproex (DEPAKOTE) DR tablet 500 mg  500 mg Oral Q12H Clapacs, Jackquline DenmarkJohn T, MD   500 mg at 12/28/17 0804  . folic acid (FOLVITE) tablet 1 mg  1 mg Oral Daily Clapacs, Jackquline DenmarkJohn T, MD   1 mg at 12/28/17 0804  . Influenza vac split quadrivalent PF (FLUARIX) injection 0.5 mL  0.5 mL Intramuscular Tomorrow-1000 Pucilowska, Jolanta B, MD      . LORazepam (ATIVAN) tablet 1 mg  1 mg Oral Q6H PRN Clapacs, Jackquline DenmarkJohn T, MD       Or  . LORazepam (ATIVAN) injection 1 mg  1 mg Intravenous Q6H PRN Clapacs, John T, MD      . magnesium hydroxide (MILK OF MAGNESIA) suspension 30 mL  30 mL Oral Daily PRN Clapacs, John T, MD      . multivitamin with minerals tablet 1 tablet  1 tablet Oral Daily Clapacs, Jackquline DenmarkJohn T, MD   1 tablet at 12/28/17 0803  . thiamine (VITAMIN B-1) tablet 100 mg  100 mg Oral Daily Clapacs, John T, MD   100 mg at 12/28/17 [id number]   Or  . thiamine (B-1) injection 100 mg  100 mg Intravenous Daily Clapacs, John T, MD      . traZODone (DESYREL) tablet 150 mg  150 mg Oral QHS Clapacs, Jackquline DenmarkJohn T, MD   150 mg at 12/27/17 2114   PTA Medications: Medications Prior to Admission  Medication Sig Dispense Refill Last Dose   . furosemide (LASIX) 20 MG tablet TAKE 1 TABLET BY MOUTH TWICE A DAY (Patient not taking: Reported on 12/22/2017) 60 tablet 1 Not Taking at Unknown time  . spironolactone (ALDACTONE) 50 MG tablet Take 1 tablet (50 mg total) by mouth daily. (Patient not taking: Reported on 12/22/2017) 30 tablet 6 Not Taking at Unknown time  . [DISCONTINUED] pantoprazole (PROTONIX) 40 MG tablet Take 1 tablet (40 mg total) by mouth daily. 30 tablet 6     Patient Stressors: Substance abuse Other: confrontation with Boyfriend  Patient Strengths: Ability for insight Capable of independent living Communication skills Supportive family/friends  Treatment Modalities: Medication Management, Group therapy, Case management,  1 to 1 session with clinician, Psychoeducation, Recreational therapy.   Physician Treatment Plan for Primary Diagnosis: Severe recurrent major depression without psychotic features (HCC) Long Term Goal(s): Improvement in symptoms so as ready for discharge   Short Term Goals: Ability to identify triggers associated with substance abuse/mental health issues will improve  Medication Management: Evaluate patient's response, side effects, and tolerance of medication regimen.  Therapeutic Interventions: 1 to 1 sessions, Unit Group sessions and Medication administration.  Evaluation of Outcomes: Progressing  Physician Treatment Plan for Secondary Diagnosis: Principal Problem:   Severe recurrent major depression without psychotic features (HCC) Active Problems:  Alcohol use disorder, severe, dependence (HCC)   Stimulant use disorder (cocaine)   Polysubstance abuse (HCC)  Long Term Goal(s): Improvement in symptoms so as ready for discharge   Short Term Goals: Ability to identify triggers associated with substance abuse/mental health issues will improve     Medication Management: Evaluate patient's response, side effects, and tolerance of medication regimen.  Therapeutic Interventions: 1 to 1  sessions, Unit Group sessions and Medication administration.  Evaluation of Outcomes: Progressing   RN Treatment Plan for Primary Diagnosis: Severe recurrent major depression without psychotic features (HCC) Long Term Goal(s): Knowledge of disease and therapeutic regimen to maintain health will improve  Short Term Goals: Ability to identify and develop effective coping behaviors will improve and Compliance with prescribed medications will improve  Medication Management: RN will administer medications as ordered by provider, will assess and evaluate patient's response and provide education to patient for prescribed medication. RN will report any adverse and/or side effects to prescribing provider.  Therapeutic Interventions: 1 on 1 counseling sessions, Psychoeducation, Medication administration, Evaluate responses to treatment, Monitor vital signs and CBGs as ordered, Perform/monitor CIWA, COWS, AIMS and Fall Risk screenings as ordered, Perform wound care treatments as ordered.  Evaluation of Outcomes: Progressing   LCSW Treatment Plan for Primary Diagnosis: Severe recurrent major depression without psychotic features (HCC) Long Term Goal(s): Safe transition to appropriate next level of care at discharge, Engage patient in therapeutic group addressing interpersonal concerns.  Short Term Goals: Engage patient in aftercare planning with referrals and resources, Identify triggers associated with mental health/substance abuse issues and Increase skills for wellness and recovery  Therapeutic Interventions: Assess for all discharge needs, 1 to 1 time with Social worker, Explore available resources and support systems, Assess for adequacy in community support network, Educate family and significant other(s) on suicide prevention, Complete Psychosocial Assessment, Interpersonal group therapy.  Evaluation of Outcomes: Progressing   Progress in Treatment: Attending groups: Yes. Participating in  groups: Yes. Taking medication as prescribed: Yes. Toleration medication: Yes. Family/Significant other contact made: Yes, individual(s) contacted:  attempted 1x with boyfriend Patient understands diagnosis: Yes. Discussing patient identified problems/goals with staff: Yes. Medical problems stabilized or resolved: Yes. Denies suicidal/homicidal ideation: Yes. Issues/concerns per patient self-inventory: No. Other:    New problem(s) identified: No, Describe:      New Short Term/Long Term Goal(s): continue on medications as prescribed  Discharge Plan or Barriers: Discharge home and follow up with [facility] and stay on medications.  Reason for Continuation of Hospitalization: Anxiety Depression Medication stabilization Withdrawal symptoms  Estimated Length of Stay:1-2 days  Attendees: Patient:Kara Lawrence 12/28/2017 5:17 PM  Physician: Kristine Linea, MD 12/28/2017 5:17 PM  Nursing: Elbert Ewings. 12/28/2017 5:17 PM  RN Care Manager: 12/28/2017 5:17 PM  Social Worker: Jake Shark, LCSW 12/28/2017 5:17 PM  Recreational Therapist: Garret Reddish, LRT 12/28/2017 5:17 PM  Other:  12/28/2017 5:17 PM  Other:  12/28/2017 5:17 PM  Other: 12/28/2017 5:17 PM    Scribe for Treatment Team: Glennon Mac, LCSW 12/28/2017 5:17 PM

## 2017-12-28 NOTE — Progress Notes (Signed)
Recreation Therapy Notes  Date: 01.14.2019  Time: 1:00 PM  Location: Craft Room  Behavioral response: N/A  Intervention Topic: Self-Esteem  Discussion/Intervention: Patient did not attend group.  Clinical Observations/Feedback:  Patient did not attend group.  Infant Doane LRT/CTRS         Cliford Sequeira 12/28/2017 2:58 PM

## 2017-12-28 NOTE — Plan of Care (Signed)
Patient progressing in all areas. 

## 2017-12-28 NOTE — Progress Notes (Signed)
Patient ID: Kara Lawrence, female   DOB: 04/25/1976, 42 y.o.   MRN: [id number] Left voicemail to follow up with Medication management clinic on Pt status and what if any paperwork is needed to continue to receive services there.  Jake SharkSara Wilman Tucker, LCSW

## 2017-12-29 NOTE — Progress Notes (Signed)
Patient is adjusting well in the unit, does ADLs but not attending groups, stays in her room most of the time, compliance with her medicines , sleep long hours at a time with out any disturbances, denies any thoughts of suicidal tendencies and contract for safety of self and others, 15 minute checks is in progress.

## 2017-12-29 NOTE — [facility] Group Notes (Signed)
12/29/2017 9:30AM  Type of Therapy/Topic:  Group Therapy:  Feelings about Diagnosis  Participation Level:  Did Not Attend   Description of Group:   This group will allow patients to explore their thoughts and feelings about diagnoses they have received. Patients will be guided to explore their level of understanding and acceptance of these diagnoses. Facilitator will encourage patients to process their thoughts and feelings about the reactions of others to their diagnosis and will guide patients in identifying ways to discuss their diagnosis with significant others in their lives. This group will be process-oriented, with patients participating in exploration of their own experiences, giving and receiving support, and processing challenge from other group members.   Therapeutic Goals: 1. Patient will demonstrate understanding of diagnosis as evidenced by identifying two or more symptoms of the disorder 2. Patient will be able to express two feelings regarding the diagnosis 3. Patient will demonstrate their ability to communicate their needs through discussion and/or role play  Summary of Patient Progress: Patient was encouraged and invited to attend group. Patient did not attend group. Social worker will continue to encourage group participation in the future.       Therapeutic Modalities:   Cognitive Behavioral Therapy Brief Therapy Feelings Identification    Johny ShearsCassandra  Trinity Hyland, LCSW 12/29/2017 10:34 AM

## 2017-12-29 NOTE — Progress Notes (Signed)
  [facility] :  Will you be returning to the same living situation after discharge:  Yes,    At discharge, do you have transportation home?: Yes,    Do you have the ability to pay for your medications: Yes,   Medication Management  Release of information consent forms completed and in the chart;  Patient's signature needed at discharge.  Patient to Follow up at: Follow-up Information    [facility] Go on 12/30/2017.   Why:  8:00am, For hospital follow up and Peer support Services,   Please keep in touch with Kara Lawrence Peer services with  [facility] at [phone] at discharge. Contact information: [address] [place] [id number] [phone]           Next level of care provider [facility] Link:no  Safety Planning and Suicide Prevention discussed: Yes,        Has patient been referred to the Quitline?: Patient refused referral  Patient has been referred for addiction treatment: Yes  Glennon MacSara P Naina Sleeper, LCSW 12/29/2017, 10:14 AM
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
## 2017-12-29 NOTE — Progress Notes (Signed)
Patient denies SI/HI, denies A/V hallucinations. Patient verbalizes understanding of discharge instructions, follow up care and prescriptions.7 days meds given to patient Patient given all belongings from  locker. Patient escorted out by staff, transported by family. 

## 2017-12-29 NOTE — [facility] Suicide Risk Assessment (Signed)
[facility]   Principal Problem: Severe recurrent major depression without psychotic features [facility](HCC) Discharge Diagnoses:  Patient Active Problem List   Diagnosis Date Noted  . Severe recurrent major depression without psychotic features (HCC) [F33.2] 09/12/2015    Priority: High  . Polysubstance abuse (HCC) [F19.10]   . Bipolar 1 disorder (HCC) [F31.9] 12/22/2017  . Personal history of nicotine dependence [Z87.891] 11/23/2015  . Acute hepatic failure [K72.00] 10/25/2015  . 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 Severe episode of recurrent major depressive disorder, without psychotic features (HCC) [F33.2] 10/25/2015  . Hepatic injury [S36.119A] 10/24/2015  . Alcohol use disorder, severe, dependence (HCC) [F10.20] 08/31/2015  . Alcohol withdrawal (HCC) [F10.239] 08/31/2015  . Stimulant use disorder (cocaine) [F15.90] 08/31/2015  . 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 Stimulant abuse (HCC) [F15.10] 08/31/2015    Total Time spent with patient: 30 minutes  Musculoskeletal: Strength & Muscle Tone: within normal limits Gait & Station: normal Patient leans: N/A  Psychiatric Specialty Exam: Review of Systems  Neurological: Positive for seizures.  Psychiatric/Behavioral: Positive for substance abuse.  All other systems reviewed and are negative.   Blood pressure 106/82, pulse (!) 101, temperature 97.9 F (36.6 C), temperature source Oral, resp. rate 18, height 5' 2.99" (1.6 m), weight 60.3 kg (133 lb), last menstrual period 12/21/2017, SpO2 100 %.Body mass index is 23.57 kg/m.  General Appearance: Casual  Eye Contact::  Good  Speech:  Clear and Coherent409  Volume:  Normal  Mood:  Euthymic  Affect:  Appropriate  Thought Process:  Goal Directed and Descriptions of Associations: Intact  Orientation:  Full  (Time, Place, and Person)  Thought Content:  WDL  Suicidal Thoughts:  No  Homicidal Thoughts:  No  Memory:  Immediate;   Fair Recent;   Fair Remote;   Fair  Judgement:  Poor  Insight:  Shallow  Psychomotor Activity:  Normal  Concentration:  Fair  Recall:  [employer]  Fund of Knowledge:Fair  Language: Fair  Akathisia:  No  Handed:  Right  AIMS (if indicated):     Assets:  Communication Skills Desire for Improvement Housing Physical Health Resilience Social Support  Sleep:  Number of Hours: 7  Cognition: WNL  ADL's:  Intact   Mental Status Per Nursing Assessment::   On Admission:     Demographic Factors:  Low socioeconomic status and Unemployed  Loss Factors: Financial problems/change in socioeconomic status  Historical Factors: Prior suicide attempts and Impulsivity  Risk Reduction Factors:   Sense of responsibility to family, Living with another person, especially a relative, Positive social support and Positive therapeutic relationship  Continued Clinical Symptoms:  Depression:   Comorbid alcohol abuse/dependence Impulsivity Alcohol/Substance Abuse/Dependencies Epilepsy  Cognitive Features That Contribute To Risk:  None    Suicide Risk:  Minimal: No identifiable suicidal ideation.  Patients presenting with no risk factors but with morbid ruminations; may be classified as minimal risk based on the severity of the depressive symptoms  Follow-up Information    [facility] Go on 12/30/2017.   Why:  7:00am, For hospital follow up and Peer support Services,   Please keep in touch with [facility] Peer services with  [facility] at [phone] at discharge. Contact information: [address] [place] [address] [phone]           Plan Of Care/Follow-up recommendations:  Activity:  as tolerated Diet:  low  sodium heart healthy Other:  keep follow up appointments  Kristine Linea, MD 12/29/2017, 8:09 AM

## 2017-12-29 NOTE — [facility] Suicide Risk Assessment (Signed)
[facility] INPATIENT:  Family/Significant Other Suicide Prevention Education  Suicide Prevention Education:  Contact Attempts: Erlinda HongDennis Johnson, [phone],boyfriend (name of family member/significant other) has been identified by the patient as the family member/significant other with whom the patient will be residing, and identified as the person(s) who will aid the patient in the event of a mental health crisis.  With written consent from the patient, two attempts were made to provide suicide prevention education, prior to and/or following the patient's discharge.  We were unsuccessful in providing suicide prevention education.  A suicide education pamphlet was given to the patient to share with family/significant other.  Date and time of first attempt:12/28/2017, 4:30pm Date and time of second attempt:12/29/2017-unable to complete, [name] shares that "She has a bad attitude and needs to stay in there.I don't need to put up with her. Bye" Then [name] disconnected the call.  Cleda DaubSara P Jamarious Febo, LCSW 12/29/2017, 10:17 AM

## 2018-01-23 ENCOUNTER — Other Ambulatory Visit: Payer: Self-pay

## 2018-01-23 ENCOUNTER — Encounter: Payer: Self-pay | Admitting: Emergency Medicine

## 2018-01-23 ENCOUNTER — Emergency Department
Admission: EM | Admit: 2018-01-23 | Discharge: 2018-01-23 | Disposition: A | Discharge status: Home / Self Care | Payer: Self-pay | Attending: Emergency Medicine | Admitting: Emergency Medicine

## 2018-01-23 DIAGNOSIS — F10920 Alcohol use, unspecified with intoxication, uncomplicated: Secondary | ICD-10-CM | POA: Insufficient documentation

## 2018-01-23 DIAGNOSIS — F1410 Cocaine abuse, uncomplicated: Secondary | ICD-10-CM | POA: Insufficient documentation

## 2018-01-23 DIAGNOSIS — Z79899 Other long term (current) drug therapy: Secondary | ICD-10-CM | POA: Insufficient documentation

## 2018-01-23 DIAGNOSIS — F1210 Cannabis abuse, uncomplicated: Secondary | ICD-10-CM | POA: Insufficient documentation

## 2018-01-23 DIAGNOSIS — F3132 Bipolar disorder, current episode depressed, moderate: Secondary | ICD-10-CM | POA: Insufficient documentation

## 2018-01-23 LAB — COMPREHENSIVE METABOLIC PANEL
ALBUMIN: 4.1 g/dL (ref 3.5–5.0)
ALK PHOS: 78 U/L (ref 38–126)
ALT: 30 U/L (ref 14–54)
ANION GAP: 15 (ref 5–15)
AST: 76 U/L — AB (ref 15–41)
BILIRUBIN TOTAL: 0.4 mg/dL (ref 0.3–1.2)
BUN: 5 mg/dL — AB (ref 6–20)
CALCIUM: 9 mg/dL (ref 8.9–10.3)
CO2: 21 mmol/L — ABNORMAL LOW (ref 22–32)
CREATININE: 0.62 mg/dL (ref 0.44–1.00)
Chloride: 104 mmol/L (ref 101–111)
GFR calc Af Amer: >60 mL/min (ref >60)
GFR calc non Af Amer: >60 mL/min (ref >60)
GLUCOSE: 83 mg/dL (ref 65–99)
Potassium: 3.7 mmol/L (ref 3.5–5.1)
Sodium: 140 mmol/L (ref 135–145)
TOTAL PROTEIN: 9 g/dL — AB (ref 6.5–8.1)

## 2018-01-23 LAB — URINE DRUG SCREEN, QUALITATIVE (ARMC ONLY)
Amphetamines, Ur Screen: NOT DETECTED
BARBITURATES, UR SCREEN: NOT DETECTED
BENZODIAZEPINE, UR SCRN: NOT DETECTED
Cannabinoid 50 Ng, Ur ~~LOC~~: POSITIVE — AB
Cocaine Metabolite,Ur ~~LOC~~: POSITIVE — AB
MDMA (Ecstasy)Ur Screen: NOT DETECTED
Methadone Scn, Ur: NOT DETECTED
OPIATE, UR SCREEN: NOT DETECTED
PHENCYCLIDINE (PCP) UR S: NOT DETECTED
Tricyclic, Ur Screen: NOT DETECTED

## 2018-01-23 LAB — ETHANOL: Alcohol, Ethyl (B): 374 mg/dL (ref <10)

## 2018-01-23 LAB — CBC
HEMATOCRIT: 30 % — AB (ref 35.0–47.0)
Hemoglobin: 9.8 g/dL — ABNORMAL LOW (ref 12.0–16.0)
MCH: 21.2 pg — ABNORMAL LOW (ref 26.0–34.0)
MCHC: 32.6 g/dL (ref 32.0–36.0)
MCV: 65 fL — AB (ref 80.0–100.0)
Platelets: 359 10*3/uL (ref 150–440)
RBC: 4.62 MIL/uL (ref 3.80–5.20)
RDW: 20.3 % — AB (ref 11.5–14.5)
WBC: 5.8 10*3/uL (ref 3.6–11.0)

## 2018-01-23 LAB — ACETAMINOPHEN LEVEL: Acetaminophen (Tylenol), Serum: <10 ug/mL — ABNORMAL LOW (ref 10–30)

## 2018-01-23 LAB — VALPROIC ACID LEVEL: Valproic Acid Lvl: <10 ug/mL — ABNORMAL LOW (ref 50.0–100.0)

## 2018-01-23 LAB — SALICYLATE LEVEL: Salicylate Lvl: <7 mg/dL (ref 2.8–30.0)

## 2018-01-23 MED ORDER — DIVALPROEX SODIUM ER 250 MG PO TB24: 500.0000 mg | ORAL_TABLET | Freq: Two times a day (BID) | ORAL | Status: DC

## 2018-01-23 MED ORDER — LORAZEPAM 2 MG PO TABS: 0.0000 mg | ORAL_TABLET | Freq: Four times a day (QID) | ORAL | Status: DC

## 2018-01-23 MED ORDER — LORAZEPAM 2 MG/ML IJ SOLN: 0.0000 mg | Freq: Two times a day (BID) | INTRAMUSCULAR | Status: DC

## 2018-01-23 MED ORDER — THIAMINE HCL 100 MG/ML IJ SOLN: 100.0000 mg | Freq: Every day | INTRAMUSCULAR | Status: DC

## 2018-01-23 MED ORDER — LORAZEPAM 2 MG PO TABS: 0.0000 mg | ORAL_TABLET | Freq: Two times a day (BID) | ORAL | Status: DC

## 2018-01-23 MED ORDER — PANTOPRAZOLE SODIUM 40 MG PO TBEC: 40.0000 mg | DELAYED_RELEASE_TABLET | Freq: Every day | ORAL | Status: DC

## 2018-01-23 MED ORDER — LORAZEPAM 2 MG/ML IJ SOLN: 0.0000 mg | Freq: Four times a day (QID) | INTRAMUSCULAR | Status: DC

## 2018-01-23 MED ORDER — VITAMIN B-1 100 MG PO TABS: 100.0000 mg | ORAL_TABLET | Freq: Every day | ORAL | Status: DC

## 2018-01-23 MED ORDER — ZIPRASIDONE MESYLATE 20 MG IM SOLR
INTRAMUSCULAR | Status: AC
  Administered 2018-01-23: 10 mg via INTRAMUSCULAR
  Filled 2018-01-23: qty 20

## 2018-01-23 MED ORDER — ZIPRASIDONE MESYLATE 20 MG IM SOLR
10.0000 mg | Freq: Once | INTRAMUSCULAR | Status: AC
  Administered 2018-01-23: 10 mg via INTRAMUSCULAR

## 2018-01-23 NOTE — ED Notes (Signed)
BEHAVIORAL HEALTH ROUNDING Patient sleeping: Yes.   Patient alert and oriented: not applicable SLEEPING Behavior appropriate: Yes.  ; If no, describe: SLEEPING Nutrition and fluids offered: No SLEEPING Toileting and hygiene offered: NoSLEEPING Sitter present: not applicable, Q 15 min safety rounds and observation. Law enforcement present: Yes ODS 

## 2018-01-23 NOTE — ED Notes (Signed)
BEHAVIORAL HEALTH ROUNDING Patient sleeping: Yes.   Patient alert and oriented: eyes closed  Appears to be asleep Behavior appropriate: Yes.  ; If no, describe:  Nutrition and fluids offered: Yes  Toileting and hygiene offered: sleeping Sitter present: q 15 minute observations and security monitoring Law enforcement present: yes  ODS 

## 2018-01-23 NOTE — ED Notes (Addendum)
ENVIRONMENTAL ASSESSMENT  Potentially harmful objects out of patient reach: Yes.  Personal belongings secured: Yes.  Patient dressed in hospital provided attire only: Yes.  Plastic bags out of patient reach: Yes.  Patient care equipment (cords, cables, call bells, lines, and drains) shortened, removed, or accounted for: Yes.  Equipment and supplies removed from bottom of stretcher: Yes.  Potentially toxic materials out of patient reach: Yes.  Sharps container removed or out of patient reach: Yes.   BEHAVIORAL HEALTH ROUNDING  Patient sleeping: No.  Patient alert and oriented: yes  Behavior appropriate: no. ; If no, describe: yelling and screaming, being aggressive with staff.  Nutrition and fluids offered: Yes  Toileting and hygiene offered: Yes  Sitter present: EDT , Q 15 min safety rounds and observation.  Law enforcement present: Yes ODS  Pt brought back to room by EDT Beth. This RN into room to speak with the pt and pt started yelling about the room being dirty and then started coming towards staff and attempted to throw the wheelchair at the RN and the EDT. Pt yelling and cursing at staff telling us to "get the f**k out of here". Dr Dolores FrameSung informed and order given for geodon.
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
## 2018-01-23 NOTE — ED Notes (Signed)
BEHAVIORAL HEALTH ROUNDING Patient sleeping: No. Patient alert and oriented: yes Behavior appropriate: Yes.  ; If no, describe:  Nutrition and fluids offered: yes Toileting and hygiene offered: Yes  Sitter present: q15 minute observations and security  monitoring Law enforcement present: Yes  ODS  

## 2018-01-23 NOTE — ED Provider Notes (Signed)
-----------------------------------------   4:32 PM on 01/23/2018 -----------------------------------------  I took over care of this patient from the prior physician.  Patient presented with alcohol intoxication and apparent suicidal behavior.  At this time the patient is A&Ox4 and clinically sober.  She has been [facility], who rescinded the IVC and recommend discharge home if patient is medically cleared.  Patient is medically cleared at this time, and stable for discharge.  She appears comfortable, is cooperative, and there is no evidence of danger to self or others.  She will be given outpatient psychiatric resources.  Return precautions given, and the patient expresses understanding.   Dionne BucySiadecki, Guillermo Difrancesco, MD 01/23/18 [phone]
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
## 2018-01-23 NOTE — ED Notes (Signed)
Patient observed lying in bed with eyes closed  Even, unlabored respirations observed   NAD pt appears to be sleeping  I will continue to monitor along with every 15 minute visual observations and ongoing security monitoring    

## 2018-01-23 NOTE — ED Notes (Signed)
She has awakened - plan of care discussed - she reports  "Y'all piss me off - why ain't nobody woke me up - I was ready to get up and nobody woke me up."  Pt informed that due to her intoxication upon arrival and her requiring medications IM - we are to allow her to sleep  "That's bullshit - go get me a drink - right now - go get me some food - right now"  [facility] set [facility] in her room and secretary informed to call them now  Pt requesting the remote control but due to [facility] being on - remote cannot be provided to the pt at this time

## 2018-01-23 NOTE — ED Notes (Addendum)

## 2018-01-23 NOTE — ED Notes (Signed)
ED  Is the patient under IVC or is there intent for IVC: Yes.   Is the patient medically cleared: Yes.   Is there vacancy in the ED BHU: Yes.   Is the population mix appropriate for patient: Yes.   Is the patient awaiting placement in inpatient or outpatient setting:   Has the patient had a psychiatric consult:  Consult pending  Survey of unit performed for contraband, proper placement and condition of furniture, tampering with fixtures in bathroom, shower, and each patient room: Yes.  ; Findings:  APPEARANCE/BEHAVIOR Rude- demanding - entitled NEURO ASSESSMENT Orientation: oriented x3  Denies pain Hallucinations: No.None noted (Hallucinations) denies  Speech: Normal Gait: normal RESPIRATORY ASSESSMENT Even  Unlabored respirations  CARDIOVASCULAR ASSESSMENT Pulses equal   regular rate  Skin warm and dry   GASTROINTESTINAL ASSESSMENT no GI complaint EXTREMITIES Full ROM  PLAN OF CARE Provide calm/safe environment. Vital signs assessed twice daily. ED BHU Assessment once each 12-hour shift. Collaborate with TTS daily or as condition indicates. Assure the ED provider has rounded once each shift. Provide and encourage hygiene. Provide redirection as needed. Assess for escalating behavior; address immediately and inform ED provider.  Assess family dynamic and appropriateness for visitation as needed: Yes.  ; If necessary, describe findings:  Educate the patient/family about BHU procedures/visitation: Yes.  ; If necessary, describe findings:

## 2018-01-23 NOTE — BH Assessment (Signed)
Patient administered IM sedation.  TTS consult unable to be completed.

## 2018-01-23 NOTE — ED Provider Notes (Addendum)
[facility] Emergency Department Provider Note   ____________________________________________   First MD Initiated Contact with Patient 01/23/18 0408     (approximate)  I have reviewed the triage vital signs and the nursing notes.   HISTORY  Chief Complaint Suicidal  Level 5 caveat: Limited by intoxication and combativeness  HPI Kara Lawrence is a 42 y.o. [place].  Patient was found intoxicated in the middle of the road, threatening to take her life.  Rest of history is unobtainable secondary to patient's combativeness.   Past Medical History:  Diagnosis Date  . Bipolar 1 disorder (HCC)   . Manic depression (HCC)   . Seizure [facility](HCC)     Patient Active Problem List   Diagnosis Date Noted  . Polysubstance abuse (HCC)   . Bipolar 1 disorder (HCC) 12/22/2017  . Personal history of nicotine dependence 11/23/2015  . Acute hepatic failure 10/25/2015  . Disseminated intravascular coagulation (HCC) 10/25/2015  . 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 Severe episode of recurrent major depressive disorder, without psychotic features (HCC) 10/25/2015  . Hepatic injury 10/24/2015  . Severe recurrent major depression without psychotic features (HCC) 09/12/2015  . Alcohol use disorder, severe, dependence (HCC) 08/31/2015  . Alcohol withdrawal (HCC) 08/31/2015  . Stimulant use disorder (cocaine) 08/31/2015  . 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 Stimulant abuse (HCC) 08/31/2015    Past Surgical History:  Procedure Laterality Date  . TUBAL LIGATION Bilateral     Prior to Admission medications   Medication Sig Start Date End Date Taking? Authorizing Provider  divalproex (DEPAKOTE ER) 500 MG 24 hr tablet Take 1 tablet (500 mg total) by mouth 2 (two) times daily. 12/28/17  Yes Pucilowska, Jolanta B, MD  pantoprazole (PROTONIX) 40 MG tablet Take 1  tablet (40 mg total) by mouth daily. 12/28/17 01/27/18 Yes Pucilowska, Jolanta B, MD  traZODone (DESYREL) 150 MG tablet Take 1 tablet (150 mg total) by mouth at bedtime. 12/28/17  Yes Pucilowska, Ellin GoodieJolanta B, MD    Allergies Patient has no known allergies.  No family history on file.  Social History Social History   Tobacco Use  . Smoking status: Never Smoker  . Smokeless tobacco: Never Used  Substance Use Topics  . Alcohol use: Yes    Comment: pt smells strongly of ETOH  . Drug use: Yes    Types: "Crack" cocaine, Benzodiazepines    Review of Systems  Constitutional: No fever/chills. Eyes: No visual changes. ENT: No sore throat. Cardiovascular: Denies chest pain. Respiratory: Denies shortness of breath. Gastrointestinal: No abdominal pain.  No nausea, no vomiting.  No diarrhea.  No constipation. Genitourinary: Negative for dysuria. Musculoskeletal: Negative for back pain. Skin: Negative for rash. Neurological: Negative for headaches, focal weakness or numbness. Psychiatric:Positive for suicidal ideation.  ____________________________________________   PHYSICAL EXAM:  VITAL SIGNS: ED Triage Vitals  Enc Vitals Group     BP 01/23/18 0333 (!) 124/92     Pulse Rate 01/23/18 0333 97     Resp 01/23/18 0333 19     Temp 01/23/18 0333 97.9 F (36.6 C)     Temp Source 01/23/18 0333 Oral     SpO2 01/23/18 0333 100 %     Weight 01/23/18 0340 129 lb (58.5 kg)     Height 01/23/18 0340 5\' 4"  (1.626 m)     Head Circumference --      Peak Flow --  Pain Score 01/23/18 0340 0     Pain Loc --      Pain Edu? --      Excl. in GC? --     Constitutional: Alert and oriented.  Disheveled appearing and in mild acute distress.  Intoxicated and combative. Eyes: Conjunctivae are normal. PERRL. EOMI. Head: Atraumatic. Nose: No congestion/rhinnorhea. Mouth/Throat: Mucous membranes are moist.  Oropharynx non-erythematous. Neck: No stridor.  No cervical spinal tenderness to  palpation. Cardiovascular: Normal rate, regular rhythm. Grossly normal heart sounds.  Good peripheral circulation. Respiratory: Normal respiratory effort.  No retractions. Lungs CTAB. Gastrointestinal: Soft and nontender. No distention. No abdominal bruits. No CVA tenderness. Musculoskeletal: No lower extremity tenderness nor edema.  No joint effusions. Neurologic:  Normal speech and language. No gross focal neurologic deficits are appreciated.  Skin:  Skin is warm, dry and intact. No rash noted. Psychiatric: Mood and affect are combative. Speech and behavior are normal.  ____________________________________________   LABS (all labs ordered are listed, but only abnormal results are displayed)  Labs Reviewed  COMPREHENSIVE METABOLIC PANEL - Abnormal; Notable for the following components:      Result Value   CO2 21 (*)    BUN 5 (*)    Total Protein 9.0 (*)    AST 76 (*)    All other components within normal limits  ETHANOL - Abnormal; Notable for the following components:   Alcohol, Ethyl (B) 374 (*)    All other components within normal limits  ACETAMINOPHEN LEVEL - Abnormal; Notable for the following components:   Acetaminophen (Tylenol), Serum <10 (*)    All other components within normal limits  CBC - Abnormal; Notable for the following components:   Hemoglobin 9.8 (*)    HCT 30.0 (*)    MCV 65.0 (*)    MCH 21.2 (*)    RDW 20.3 (*)    All other components within normal limits  URINE DRUG SCREEN, QUALITATIVE ([facility] ONLY) - Abnormal; Notable for the following components:   Cocaine Metabolite,Ur [place] POSITIVE (*)    Cannabinoid 50 Ng, Ur [place] POSITIVE (*)    All other components within normal limits  VALPROIC ACID LEVEL - Abnormal; Notable for the following components:   Valproic Acid Lvl <10 (*)    All other components within normal limits  SALICYLATE LEVEL  POC URINE PREG, ED    ____________________________________________  EKG  None ____________________________________________  RADIOLOGY  ED MD interpretation: None  Official radiology report(s): No results found.  ____________________________________________   PROCEDURES  Procedure(s) performed: None  Procedures  Critical Care performed: Yes, see critical care note(s)   CRITICAL CARE Performed by: Irean Hong   Total critical care time: 30 minutes  Critical care time was exclusive of separately billable procedures and treating other patients.  Critical care was necessary to treat or prevent imminent or life-threatening deterioration.  Critical care was time spent personally by me on the following activities: development of treatment plan with patient and/or surrogate as well as nursing, discussions with consultants, evaluation of patient's response to treatment, examination of patient, obtaining history from patient or surrogate, ordering and performing treatments and interventions, ordering and review of laboratory studies, ordering and review of radiographic studies, pulse oximetry and re-evaluation of patient's condition.  ____________________________________________   INITIAL IMPRESSION / ASSESSMENT AND PLAN / ED COURSE  As part of my medical decision making, I reviewed the following data within the electronic MEDICAL RECORD NUMBER Nursing notes reviewed and incorporated, Labs reviewed, Old chart reviewed, A  consult was requested and obtained from this/these consultant(s) Psychiatry and Notes from prior ED visits.   42 year old female found intoxicated in the road, threatening to take her life.  She is currently yelling, combative and aggressive with staff.  Will place her under involuntary commitment for her safety.  At this time patient is unable to be verbally redirected and will require IM calming agent.  Clinical Course as of Jan 24 648  Sat Jan 23, 2018  0645 IM calming agent was  given with good effect.  Patient is currently sleeping no acute distress.  Laboratory and urinalysis results remarkable for elevated EtOH, positive cocaine metabolites and cannabinoids.  Patient is medically cleared at this time and will remain in the ED pending [facility] psychiatry evaluation.  Disposition per psychiatry.  [JS]    Clinical Course User Index [JS] Irean Hong, MD     ____________________________________________   FINAL CLINICAL IMPRESSION(S) / ED DIAGNOSES  Final diagnoses:  Alcoholic intoxication without complication (HCC)  Bipolar affective disorder, currently depressed, moderate (HCC)  Cocaine abuse (HCC)  Marijuana abuse     ED Discharge Orders    None       Note:  This document was prepared using Dragon voice recognition software and may include unintentional dictation errors.    Irean Hong, MD 01/23/18 9147    Irean Hong, MD 01/23/18 [phone]

## 2018-01-23 NOTE — ED Notes (Signed)
CIWA initiated - pt continues to rest quietly

## 2018-01-23 NOTE — ED Notes (Signed)
IVC, SOC called as patient is awake  1451

## 2018-01-23 NOTE — BH Assessment (Signed)
Writer was unable to assess patient for TTS Consult. Patient was giving IM medications and was sleeping. Patient was unable to participate in interview.

## 2018-01-23 NOTE — ED Notes (Signed)
[facility]  - she has been using the telephone  NAD assessed

## 2018-02-15 ENCOUNTER — Telehealth: Payer: Self-pay | Admitting: Pharmacy Technician

## 2018-02-15 NOTE — Telephone Encounter (Signed)
Received updated proof of income.  Patient eligible to receive medication assistance at [facility] through 2019, as long as eligibility requirements continue to be met.  [facility]

## 2018-07-04 ENCOUNTER — Encounter: Payer: Self-pay | Admitting: Emergency Medicine

## 2018-07-04 ENCOUNTER — Emergency Department
Admission: EM | Admit: 2018-07-04 | Discharge: 2018-07-04 | Disposition: A | Discharge status: Home / Self Care | Payer: Self-pay | Attending: Emergency Medicine | Admitting: Emergency Medicine

## 2018-07-04 DIAGNOSIS — F1210 Cannabis abuse, uncomplicated: Secondary | ICD-10-CM | POA: Insufficient documentation

## 2018-07-04 DIAGNOSIS — F1410 Cocaine abuse, uncomplicated: Secondary | ICD-10-CM | POA: Insufficient documentation

## 2018-07-04 DIAGNOSIS — F319 Bipolar disorder, unspecified: Secondary | ICD-10-CM | POA: Insufficient documentation

## 2018-07-04 DIAGNOSIS — Z79899 Other long term (current) drug therapy: Secondary | ICD-10-CM | POA: Insufficient documentation

## 2018-07-04 DIAGNOSIS — F10920 Alcohol use, unspecified with intoxication, uncomplicated: Secondary | ICD-10-CM | POA: Insufficient documentation

## 2018-07-04 LAB — COMPREHENSIVE METABOLIC PANEL
ALK PHOS: 66 U/L (ref 38–126)
ALT: 26 U/L (ref 0–44)
AST: 66 U/L — ABNORMAL HIGH (ref 15–41)
Albumin: 4 g/dL (ref 3.5–5.0)
Anion gap: 12 (ref 5–15)
BUN: 6 mg/dL (ref 6–20)
CALCIUM: 8.6 mg/dL — AB (ref 8.9–10.3)
CO2: 21 mmol/L — AB (ref 22–32)
CREATININE: 0.63 mg/dL (ref 0.44–1.00)
Chloride: 109 mmol/L (ref 98–111)
GFR calc non Af Amer: >60 mL/min (ref >60)
GLUCOSE: 94 mg/dL (ref 70–99)
Potassium: 3.5 mmol/L (ref 3.5–5.1)
Sodium: 142 mmol/L (ref 135–145)
Total Bilirubin: 0.4 mg/dL (ref 0.3–1.2)
Total Protein: 8.6 g/dL — ABNORMAL HIGH (ref 6.5–8.1)

## 2018-07-04 LAB — URINALYSIS, COMPLETE (UACMP) WITH MICROSCOPIC
Bilirubin Urine: NEGATIVE
Glucose, UA: NEGATIVE mg/dL
Hgb urine dipstick: NEGATIVE
KETONES UR: NEGATIVE mg/dL
Leukocytes, UA: NEGATIVE
Nitrite: NEGATIVE
PROTEIN: NEGATIVE mg/dL
Specific Gravity, Urine: 1.003 — ABNORMAL LOW (ref 1.005–1.030)
pH: 5 (ref 5.0–8.0)

## 2018-07-04 LAB — URINE DRUG SCREEN, QUALITATIVE (ARMC ONLY)
AMPHETAMINES, UR SCREEN: NOT DETECTED
Benzodiazepine, Ur Scrn: NOT DETECTED
COCAINE METABOLITE, UR ~~LOC~~: POSITIVE — AB
Cannabinoid 50 Ng, Ur ~~LOC~~: POSITIVE — AB
MDMA (Ecstasy)Ur Screen: NOT DETECTED
Methadone Scn, Ur: NOT DETECTED
OPIATE, UR SCREEN: NOT DETECTED
PHENCYCLIDINE (PCP) UR S: NOT DETECTED
Tricyclic, Ur Screen: NOT DETECTED

## 2018-07-04 LAB — CBC WITH DIFFERENTIAL/PLATELET
Basophils Absolute: 0 10*3/uL (ref 0–0.1)
Basophils Relative: 1 %
Eosinophils Absolute: 0 10*3/uL (ref 0–0.7)
Eosinophils Relative: 1 %
HEMATOCRIT: 30.8 % — AB (ref 35.0–47.0)
Hemoglobin: 9.8 g/dL — ABNORMAL LOW (ref 12.0–16.0)
LYMPHS ABS: 2.9 10*3/uL (ref 1.0–3.6)
Lymphocytes Relative: 47 %
MCH: 21 pg — AB (ref 26.0–34.0)
MCHC: 32 g/dL (ref 32.0–36.0)
MCV: 65.6 fL — AB (ref 80.0–100.0)
MONO ABS: 0.6 10*3/uL (ref 0.2–0.9)
MONOS PCT: 9 %
NEUTROS ABS: 2.5 10*3/uL (ref 1.4–6.5)
Neutrophils Relative %: 42 %
Platelets: 394 10*3/uL (ref 150–440)
RBC: 4.69 MIL/uL (ref 3.80–5.20)
RDW: 22 % — AB (ref 11.5–14.5)
WBC: 6.1 10*3/uL (ref 3.6–11.0)

## 2018-07-04 LAB — PREGNANCY, URINE: PREG TEST UR: NEGATIVE

## 2018-07-04 LAB — POCT PREGNANCY, URINE: PREG TEST UR: NEGATIVE

## 2018-07-04 LAB — ETHANOL: Alcohol, Ethyl (B): 361 mg/dL (ref <10)

## 2018-07-04 LAB — ACETAMINOPHEN LEVEL: Acetaminophen (Tylenol), Serum: <10 ug/mL — ABNORMAL LOW (ref 10–30)

## 2018-07-04 LAB — SALICYLATE LEVEL: Salicylate Lvl: <7 mg/dL (ref 2.8–30.0)

## 2018-07-04 MED ORDER — SODIUM CHLORIDE 0.9 % IV BOLUS
1000.0000 mL | Freq: Once | INTRAVENOUS | Status: AC
  Administered 2018-07-04: 1000 mL via INTRAVENOUS

## 2018-07-04 MED ORDER — HALOPERIDOL LACTATE 5 MG/ML IJ SOLN
5.0000 mg | Freq: Once | INTRAMUSCULAR | Status: AC
  Administered 2018-07-04: 5 mg via INTRAMUSCULAR
  Filled 2018-07-04: qty 1

## 2018-07-04 MED ORDER — LORAZEPAM 2 MG/ML IJ SOLN
2.0000 mg | Freq: Once | INTRAMUSCULAR | Status: AC
  Administered 2018-07-04: 2 mg via INTRAMUSCULAR
  Filled 2018-07-04: qty 1

## 2018-07-04 MED ORDER — DIPHENHYDRAMINE HCL 50 MG/ML IJ SOLN
25.0000 mg | Freq: Once | INTRAMUSCULAR | Status: AC
  Administered 2018-07-04: 25 mg via INTRAMUSCULAR
  Filled 2018-07-04: qty 1

## 2018-07-04 NOTE — ED Triage Notes (Signed)
Pt. Arrived to ED via EMS with suspected Alcohol intoxication.  Pt. Belligerent and combative with staff.

## 2018-07-04 NOTE — ED Notes (Signed)
Lab called to report critical ETOH of 361 MD informed.

## 2018-07-04 NOTE — BH Assessment (Signed)
Tele Assessment Note   Patient Name: Kara Lawrence MRN: [id number] Referring Physician:  Location of Patient:  Location of Provider: Behavioral Health TTS Department  Kara Lawrence is an 42 y.o. female.  Patient was brought to ED by EMS; Pt refused to provide information on what occurred last night; Pt stated she was ready to go home; Pt stated she is not SI/HI; pt admitted to being SI in the past, but denied current feelings of SI; pt does not have a plan to harm herself; Pt admitted to ETOH use, but denied any other drug use; Pt denies A/V hallucinations; Pt lives with her grandmother, "[name]"; pt stated she can return to her grandmother's home; pt is unemployed; pt is on depakote, but would not state why; Pt has an appointment with [facility] for August; Pt stated she does not have a history of abuse; Denies mental health in her family; Pt refused to answer several questions; Pt was guarded and uncooperative and she continued to state she was ready to go home  Diagnosis:  Axis I: Alcohol Use Disorder, Severe Axis II: Deferred Axis III: refer to medical Notes Axis IV: housing, employment, mental health  Past Medical History:  Past Medical History:  Diagnosis Date  . Bipolar 1 disorder (HCC)   . Manic depression (HCC)   . Seizure [facility])     Past Surgical History:  Procedure Laterality Date  . TUBAL LIGATION Bilateral     Family History: History reviewed. No pertinent family history.  Social History:  reports that she has never smoked. She has never used smokeless tobacco. She reports that she drinks alcohol. She reports that she has current or past drug history. Drugs: "Crack" cocaine and Benzodiazepines.  Additional Social History:     CIWA: CIWA-Ar BP: 139/74 Pulse Rate: 94 COWS:    Allergies: No Known Allergies  Home Medications:  (Not in a hospital admission)  OB/GYN Status:  No LMP recorded.  General Assessment Data Location of Assessment: [facility]  ED TTS Assessment: In system Is this a Tele or Face-to-Face Assessment?: Face-to-Face Is this an Initial Assessment or a Re-assessment for this encounter?: Initial Assessment Living Arrangements: Other (Comment)(Grandmother "[name]") Can pt return to current living arrangement?: Yes Admission Status: Voluntary Is patient capable of signing voluntary admission?: Yes Referral Source: Self/Family/Friend Insurance type: None  Medical Screening Exam [facility] Walk-in ONLY) Medical Exam completed: Yes  Crisis Care Plan Living Arrangements: Other (Comment)(Grandmother "[name]")     Risk to self with the past 6 months Suicidal Ideation: No Has patient been a risk to self within the past 6 months prior to admission? : No Suicidal Intent: No Has patient had any suicidal intent within the past 6 months prior to admission? : No Is patient at risk for suicide?: No Suicidal Plan?: No Has patient had any suicidal plan within the past 6 months prior to admission? : No Access to Means: Yes(Household Items) Specify Access to Suicidal Means: Household Items What has been your use of drugs/alcohol within the last 12 months?: alcohol Previous Attempts/Gestures: Yes How many times?: 3 Other Self Harm Risks: ETOH Triggers for Past Attempts: Unknown Intentional Self Injurious Behavior: Damaging(ETOh) Comment - Self Injurious Behavior: ETOH Family Suicide History: No Recent stressful life event(s): Other (Comment)(did not want to comment) Persecutory voices/beliefs?: No Depression: No Substance abuse history and/or treatment for substance abuse?: Yes Suicide prevention information given to non-admitted patients: Yes  Risk to Others within the past 6 months Homicidal Ideation: No Does patient have  any lifetime risk of violence toward others beyond the six months prior to admission? : Yes (comment) Thoughts of Harm to Others: No Current Homicidal Intent: No Current Homicidal Plan: No Access to  Homicidal Means: Yes(Household Items) Describe Access to Homicidal Means: Household Items Identified Victim: None History of harm to others?: Yes(pt stated in the past) Assessment of Violence: In distant past Violent Behavior Description: agitated  Does patient have access to weapons?: No Criminal Charges Pending?: No Does patient have a court date: No Is patient on probation?: No  Psychosis Hallucinations: None noted Delusions: None noted  Mental Status Report Appearance/Hygiene: In scrubs Eye Contact: Poor Motor Activity: Agitation Speech: Logical/coherent, Pressured Level of Consciousness: Alert, Irritable Mood: Anxious, Irritable Affect: Irritable, Appropriate to circumstance Anxiety Level: None Thought Processes: Coherent, Relevant Judgement: Impaired Orientation: Person, Place, Time, Situation Obsessive Compulsive Thoughts/Behaviors: None  Cognitive Functioning Concentration: Normal Memory: Recent Intact, Remote Intact Is patient IDD: No Is patient DD?: No Insight: Poor Impulse Control: Poor Appetite: Fair Have you had any weight changes? : No Change Sleep: No Change Total Hours of Sleep: 5 Vegetative Symptoms: None  ADLScreening [facility] Assessment Services) Patient's cognitive ability adequate to safely complete daily activities?: Yes Patient able to express need for assistance with ADLs?: Yes Independently performs ADLs?: Yes (appropriate for developmental age)  Prior Inpatient Therapy Prior Inpatient Therapy: Yes Prior Therapy Dates: unknown Prior Therapy Facilty/Provider(s): unknown Reason for Treatment: mental health  Prior Outpatient Therapy Prior Outpatient Therapy: Yes Prior Therapy Dates: current Prior Therapy Facilty/Provider(s): [facility] Reason for Treatment: mental health Does patient have an ACCT team?: No Does patient have Intensive In-House Services?  : No Does patient have [facility] services? : No Does patient have P4CC services?: No  ADL  Screening (condition at time of admission) Patient's cognitive ability adequate to safely complete daily activities?: Yes Patient able to express need for assistance with ADLs?: Yes Independently performs ADLs?: Yes (appropriate for developmental age)                  Additional Information 1:1 In Past 12 Months?: No CIRT Risk: No Elopement Risk: No Does patient have medical clearance?: Yes     Disposition:  Disposition Initial Assessment Completed for this Encounter: Yes   Jinny SandersFoxx, Karisma Meiser [place] 07/04/2018 3:30 PM

## 2018-07-04 NOTE — [facility] Counselor (Signed)
Unable to access as patient is asleep, Spoke with Nurse

## 2018-07-04 NOTE — BH Assessment (Signed)
This Clinical research associatewriter attempted assessment however pt was given IM medications by her nurse due to aggression and combativeness causing sedation. Pt ETOH of 361. Currently unable to safely participate in assessment.

## 2018-07-04 NOTE — ED Notes (Signed)
Pt awake and wanting to go home. Denies SI/HI and AVH. Pt cooperative, but demanding of staff. Pt understands [facility]. Maintained on 15 minute checks and observation by security camera for safety.

## 2018-07-04 NOTE — ED Notes (Signed)
Pt. Had additional purple bag that contained a watch, 2 pair of underwear, 1 pair of white socks, toothbrush, hair brush, nail polish.

## 2018-07-04 NOTE — ED Provider Notes (Addendum)
-----------------------------------------   5:22 PM on 07/04/2018 -----------------------------------------  [facility] for [facility].  IVC has been rescinded.  The patient is sober and alert, and is stable for discharge at this time. Return precautions given and she expresses understanding.        Dionne BucySiadecki, Ricketta Colantonio, MD 07/04/18 1724

## 2018-07-04 NOTE — ED Notes (Signed)
Pt changed into behavioral clothing by this Clinical research associatewriter and Alissa EDT. Items obtained are as follows.....  Solid white t-shirt White sweat pants Pink underwear Grey bra American flag EBT card Dark and light purple pocketbook with no valuables in it  [employer] flip flops  All items placed in belongings bag and labeled wit pt name on it

## 2018-07-04 NOTE — ED Notes (Signed)
Patient asleep in room. No noted distress or abnormal behavior. Will continue 15 minute checks and observation by security cameras for safety. 

## 2018-07-04 NOTE — ED Provider Notes (Signed)
[facility] Emergency Department Provider Note   ____________________________________________   First MD Initiated Contact with Patient 07/04/18 0327     (approximate)  I have reviewed the triage vital signs and the nursing notes.   HISTORY  Chief Complaint Alcohol Intoxication  Level 5 caveat: Limited by intoxication  HPI Kara Hardie ShackletonShawanna Lawrence is a 42 y.o. female brought to the ED from home via EMS with initial call out for respiratory distress.  Upon arrival however, it is clear that patient is heavily intoxicated.  She is cursing and uncooperative.  Admits to suicidal ideation without plan.  Rest of history is unobtainable secondary to level of intoxication.   Past Medical History:  Diagnosis Date  . Bipolar 1 disorder (HCC)   . Manic depression (HCC)   . Seizure [facility](HCC)     Patient Active Problem List   Diagnosis Date Noted  . Polysubstance abuse (HCC)   . Bipolar 1 disorder (HCC) 12/22/2017  . Personal history of nicotine dependence 11/23/2015  . Acute hepatic failure 10/25/2015  . Disseminated intravascular coagulation (HCC) 10/25/2015  . 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 Severe episode of recurrent major depressive disorder, without psychotic features (HCC) 10/25/2015  . Hepatic injury 10/24/2015  . Severe recurrent major depression without psychotic features (HCC) 09/12/2015  . Alcohol use disorder, severe, dependence (HCC) 08/31/2015  . Alcohol withdrawal (HCC) 08/31/2015  . Stimulant use disorder (cocaine) 08/31/2015  . 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 Stimulant abuse (HCC) 08/31/2015    Past Surgical History:  Procedure Laterality Date  . TUBAL LIGATION Bilateral     Prior to Admission medications   Medication Sig Start Date End Date Taking? Authorizing Provider  divalproex (DEPAKOTE ER) 500 MG 24 hr tablet Take 1 tablet (500 mg total) by mouth 2 (two) times daily.  12/28/17   Pucilowska, Jolanta B, MD  pantoprazole (PROTONIX) 40 MG tablet Take 1 tablet (40 mg total) by mouth daily. 12/28/17 01/27/18  Pucilowska, Ellin GoodieJolanta B, MD  traZODone (DESYREL) 150 MG tablet Take 1 tablet (150 mg total) by mouth at bedtime. 12/28/17   Pucilowska, Ellin GoodieJolanta B, MD    Allergies Patient has no known allergies.  No family history on file.  Social History Social History   Tobacco Use  . Smoking status: Never Smoker  . Smokeless tobacco: Never Used  Substance Use Topics  . Alcohol use: Yes    Comment: pt smells strongly of ETOH  . Drug use: Yes    Types: "Crack" cocaine, Benzodiazepines    Review of Systems  Constitutional: No fever/chills Eyes: No visual changes. ENT: No sore throat. Cardiovascular: Denies chest pain. Respiratory: Denies shortness of breath. Gastrointestinal: No abdominal pain.  No nausea, no vomiting.  No diarrhea.  No constipation. Genitourinary: Negative for dysuria. Musculoskeletal: Negative for back pain. Skin: Negative for rash. Neurological: Negative for headaches, focal weakness or numbness. Psychiatric:Positive for intoxication and suicidal ideation.  ____________________________________________   PHYSICAL EXAM:  VITAL SIGNS: ED Triage Vitals  Enc Vitals Group     BP      Pulse      Resp      Temp      Temp src      SpO2      Weight      Height      Head Circumference      Peak Flow      Pain Score      Pain Loc  Pain Edu?      Excl. in GC?     Constitutional: Alert and oriented.  Disheveled appearing and belligerent. Eyes: Conjunctivae are bloodshot bilaterally. PERRL. EOMI. Head: Atraumatic. Nose: No external evidence of trauma or injury. Mouth/Throat: Mucous membranes are moist.  Oropharynx non-erythematous. Neck: No stridor.  No cervical spine tenderness to palpation. Cardiovascular: Normal rate, regular rhythm. Grossly normal heart sounds.  Good peripheral circulation. Respiratory: Normal respiratory  effort.  No retractions. Lungs CTAB. Gastrointestinal: Soft and nontender. No distention. No abdominal bruits. No CVA tenderness. Musculoskeletal: No lower extremity tenderness nor edema.  No joint effusions. Neurologic:  Normal speech and language. No gross focal neurologic deficits are appreciated. MAEx4. Skin:  Skin is warm, dry and intact. No rash noted. Psychiatric: Intoxicated.  Mood and affect are agitated, combative.  ____________________________________________   LABS (all labs ordered are listed, but only abnormal results are displayed)  Labs Reviewed  CBC WITH DIFFERENTIAL/PLATELET - Abnormal; Notable for the following components:      Result Value   Hemoglobin 9.8 (*)    HCT 30.8 (*)    MCV 65.6 (*)    MCH 21.0 (*)    RDW 22.0 (*)    All other components within normal limits  COMPREHENSIVE METABOLIC PANEL - Abnormal; Notable for the following components:   CO2 21 (*)    Calcium 8.6 (*)    Total Protein 8.6 (*)    AST 66 (*)    All other components within normal limits  ETHANOL - Abnormal; Notable for the following components:   Alcohol, Ethyl (B) 361 (*)    All other components within normal limits  ACETAMINOPHEN LEVEL - Abnormal; Notable for the following components:   Acetaminophen (Tylenol), Serum <10 (*)    All other components within normal limits  URINE DRUG SCREEN, QUALITATIVE ([facility] ONLY) - Abnormal; Notable for the following components:   Cocaine Metabolite,Ur [place] POSITIVE (*)    Cannabinoid 50 Ng, Ur [place] POSITIVE (*)    Barbiturates, Ur Screen   (*)    Value: Result not available. Reagent lot number recalled by manufacturer.   All other components within normal limits  URINALYSIS, COMPLETE (UACMP) WITH MICROSCOPIC - Abnormal; Notable for the following components:   Color, Urine STRAW (*)    APPearance CLEAR (*)    Specific Gravity, Urine 1.003 (*)    Bacteria, UA RARE (*)    All other components within normal limits  SALICYLATE LEVEL  POCT PREGNANCY,  URINE  POC URINE PREG, ED   ____________________________________________  EKG  None ____________________________________________  RADIOLOGY  ED MD interpretation: None  Official radiology report(s): No results found.  ____________________________________________   PROCEDURES  Procedure(s) performed: None  Procedures  Critical Care performed: No  ____________________________________________   INITIAL IMPRESSION / ASSESSMENT AND PLAN / ED COURSE  As part of my medical decision making, I reviewed the following data within the electronic MEDICAL RECORD NUMBER Nursing notes reviewed and incorporated, Labs reviewed, Old chart reviewed, A consult was requested and obtained from this/these consultant(s) Psychiatry and Notes from prior ED visits   42 year old female with bipolar disorder, polysubstance abuse who presents to the ED heavily intoxicated and endorsing suicidal ideation without plan.  Patient placed under IVC for her safety.  She is currently agitated and combative, requiring calming agents.   Clinical Course as of Jul 04 712  Sun Jul 04, 2018  7829 UDS notable for cocaine metabolites and cannabinoids.  Patient resting in no acute distress after calming agents  given.  IV fluids infusing.   [JS]  [id number] Patient sleeping in no acute distress.  Sitter discontinued.  Awaiting psychiatric evaluation and disposition.   [JS]    Clinical Course User Index [JS] Irean Hong, MD     ____________________________________________   FINAL CLINICAL IMPRESSION(S) / ED DIAGNOSES  Final diagnoses:  Alcoholic intoxication without complication (HCC)  Cocaine abuse (HCC)  Marijuana abuse     ED Discharge Orders    None       Note:  This document was prepared using Dragon voice recognition software and may include unintentional dictation errors.    Irean Hong, MD 07/04/18 [phone]

## 2018-07-04 NOTE — ED Notes (Signed)
Pt discharged to lobby to call for a ride. VS stable. Pt denies SI/HI and AVH. All belongings returned to patient.  Pt has signed for discharge paperwork.

## 2018-08-22 ENCOUNTER — Emergency Department
Admission: EM | Admit: 2018-08-22 | Discharge: 2018-08-23 | Disposition: A | Discharge status: Home / Self Care | Payer: Self-pay | Attending: Emergency Medicine | Admitting: Emergency Medicine

## 2018-08-22 ENCOUNTER — Other Ambulatory Visit: Payer: Self-pay

## 2018-08-22 DIAGNOSIS — Z79899 Other long term (current) drug therapy: Secondary | ICD-10-CM | POA: Insufficient documentation

## 2018-08-22 DIAGNOSIS — R45851 Suicidal ideations: Secondary | ICD-10-CM

## 2018-08-22 DIAGNOSIS — Z590 Homelessness: Secondary | ICD-10-CM | POA: Insufficient documentation

## 2018-08-22 DIAGNOSIS — F1910 Other psychoactive substance abuse, uncomplicated: Secondary | ICD-10-CM

## 2018-08-22 DIAGNOSIS — F1010 Alcohol abuse, uncomplicated: Secondary | ICD-10-CM

## 2018-08-22 DIAGNOSIS — F319 Bipolar disorder, unspecified: Secondary | ICD-10-CM | POA: Insufficient documentation

## 2018-08-22 LAB — ETHANOL: ALCOHOL ETHYL (B): 330 mg/dL — AB (ref <10)

## 2018-08-22 LAB — URINE DRUG SCREEN, QUALITATIVE (ARMC ONLY)
Amphetamines, Ur Screen: NOT DETECTED
BENZODIAZEPINE, UR SCRN: NOT DETECTED
Barbiturates, Ur Screen: NOT DETECTED
COCAINE METABOLITE, UR ~~LOC~~: POSITIVE — AB
Cannabinoid 50 Ng, Ur ~~LOC~~: POSITIVE — AB
MDMA (Ecstasy)Ur Screen: NOT DETECTED
Methadone Scn, Ur: NOT DETECTED
Opiate, Ur Screen: NOT DETECTED
Phencyclidine (PCP) Ur S: NOT DETECTED
TRICYCLIC, UR SCREEN: NOT DETECTED

## 2018-08-22 LAB — COMPREHENSIVE METABOLIC PANEL
ALT: 37 U/L (ref 0–44)
AST: 114 U/L — ABNORMAL HIGH (ref 15–41)
Albumin: 4 g/dL (ref 3.5–5.0)
Alkaline Phosphatase: 69 U/L (ref 38–126)
Anion gap: 12 (ref 5–15)
BUN: 9 mg/dL (ref 6–20)
CALCIUM: 8.9 mg/dL (ref 8.9–10.3)
CHLORIDE: 105 mmol/L (ref 98–111)
CO2: 21 mmol/L — AB (ref 22–32)
Creatinine, Ser: 0.55 mg/dL (ref 0.44–1.00)
Glucose, Bld: 98 mg/dL (ref 70–99)
Potassium: 3.7 mmol/L (ref 3.5–5.1)
SODIUM: 138 mmol/L (ref 135–145)
Total Bilirubin: 0.4 mg/dL (ref 0.3–1.2)
Total Protein: 8.7 g/dL — ABNORMAL HIGH (ref 6.5–8.1)

## 2018-08-22 LAB — CBC
HCT: 28.2 % — ABNORMAL LOW (ref 35.0–47.0)
HEMOGLOBIN: 10 g/dL — AB (ref 12.0–16.0)
MCH: 22.7 pg — ABNORMAL LOW (ref 26.0–34.0)
MCHC: 35.3 g/dL (ref 32.0–36.0)
MCV: 64.4 fL — ABNORMAL LOW (ref 80.0–100.0)
PLATELETS: 379 10*3/uL (ref 150–440)
RBC: 4.38 MIL/uL (ref 3.80–5.20)
RDW: 21.5 % — ABNORMAL HIGH (ref 11.5–14.5)
WBC: 6.1 10*3/uL (ref 3.6–11.0)

## 2018-08-22 LAB — ACETAMINOPHEN LEVEL

## 2018-08-22 LAB — SALICYLATE LEVEL

## 2018-08-22 MED ORDER — HALOPERIDOL LACTATE 5 MG/ML IJ SOLN
INTRAMUSCULAR | Status: AC
  Administered 2018-08-22: 5 mg via INTRAMUSCULAR
  Filled 2018-08-22: qty 1

## 2018-08-22 MED ORDER — DIPHENHYDRAMINE HCL 50 MG/ML IJ SOLN
50.0000 mg | Freq: Once | INTRAMUSCULAR | Status: AC
  Administered 2018-08-22: 50 mg via INTRAMUSCULAR

## 2018-08-22 MED ORDER — LORAZEPAM 2 MG/ML IJ SOLN
INTRAMUSCULAR | Status: AC
  Administered 2018-08-22: 2 mg via INTRAMUSCULAR
  Filled 2018-08-22: qty 1

## 2018-08-22 MED ORDER — DIVALPROEX SODIUM 500 MG PO DR TAB
500.0000 mg | DELAYED_RELEASE_TABLET | Freq: Two times a day (BID) | ORAL | Status: DC
  Administered 2018-08-22: 500 mg via ORAL
  Filled 2018-08-22: qty 1

## 2018-08-22 MED ORDER — PANTOPRAZOLE SODIUM 40 MG PO TBEC
40.0000 mg | DELAYED_RELEASE_TABLET | Freq: Every day | ORAL | Status: DC
  Administered 2018-08-22: 40 mg via ORAL
  Filled 2018-08-22: qty 1

## 2018-08-22 MED ORDER — DIPHENHYDRAMINE HCL 50 MG/ML IJ SOLN
INTRAMUSCULAR | Status: AC
  Administered 2018-08-22: 50 mg via INTRAMUSCULAR
  Filled 2018-08-22: qty 1

## 2018-08-22 MED ORDER — TRAZODONE HCL 50 MG PO TABS: 150.0000 mg | ORAL_TABLET | Freq: Every day | ORAL | Status: DC

## 2018-08-22 MED ORDER — LORAZEPAM 2 MG/ML IJ SOLN
2.0000 mg | Freq: Once | INTRAMUSCULAR | Status: AC
  Administered 2018-08-22: 2 mg via INTRAMUSCULAR

## 2018-08-22 MED ORDER — HALOPERIDOL LACTATE 5 MG/ML IJ SOLN
5.0000 mg | Freq: Once | INTRAMUSCULAR | Status: AC
  Administered 2018-08-22: 5 mg via INTRAMUSCULAR

## 2018-08-22 NOTE — ED Triage Notes (Signed)
Patient here with  PD under IVC.

## 2018-08-22 NOTE — ED Provider Notes (Signed)
Patient seen and cleared by psychiatry.  She does not want a pregnancy test.  Patient says she still suicidal and does not want to leave.  I will have Dr. Mat Carne packs see her in the morning again.   Arnaldo Natal, MD 08/22/18 2257

## 2018-08-22 NOTE — ED Notes (Signed)
Hourly rounding reveals patient sleeping in room. No complaints, stable, in no acute distress. Q15 minute rounds and monitoring via Rover and Officer to continue.  

## 2018-08-22 NOTE — ED Notes (Signed)
Hourly rounding reveals patient in room. No complaints, stable, in no acute distress. Q15 minute rounds and monitoring via Rover and Officer to continue.   

## 2018-08-22 NOTE — ED Notes (Addendum)
Report to include situation, background, assessment and recommendations from RN. Patient sleeping, respirations regular and unlabored. Q15 minute rounds  observation by Psychologist, counselling to continue.

## 2018-08-22 NOTE — Progress Notes (Signed)
Pt A & O X 3. Denies SI, HI, AVH and pain at this time. Presents with flat affect and depressed mood. Cooperative with care at this time. Started on medications (Depakote & Protonix) was compliant. Denies adverse drug reactions when assessed. Support and encouragement offered. Tolerated fluids well, ate approximately 50% of lunch tray. Safety checks maintained without self harm gestures or outburst to note a this time.

## 2018-08-22 NOTE — ED Notes (Signed)
Pt. Stepping outside of room and yelling at staff.

## 2018-08-22 NOTE — ED Notes (Signed)
Pt sleeping. Lunch tray placed in pt room.

## 2018-08-22 NOTE — Discharge Instructions (Addendum)
Today Kara Lawrence was seen and evaluated by board-certified psychiatrist who determined she did not need to be admitted to the hospital for psychiatric stabilization.  She is medically stable for booking.

## 2018-08-22 NOTE — ED Notes (Signed)
Pt. Threw a cup of water with ice out of her room.  When this nurse asked "what is wrong,  Pt. Became belligerent and began yelling "fuck you",  Pt. Asked to lower her voice, pt. Continued yelling "fuck you and fuck them".

## 2018-08-22 NOTE — ED Provider Notes (Signed)
[facility] Emergency Department Provider Note   First MD Initiated Contact with Patient 08/22/18 [phone]     (approximate)  I have reviewed the triage vital signs and the nursing notes.   HISTORY  Chief Complaint Psychiatric Evaluation   HPI Kara Lawrence is a 42 y.o. female with below list of chronic medical conditions including polysubstance abuse and bipolar disorder presents to the emergency department involuntarily committed secondary to suicidal ideation.  Patient admits to EtOH ingestion tonight.  On my arrival to the room patient does voice suicidal ideation.  Patient agitated and verbally abusive to staff at this time.  Patient throwing objects in the room.   Past Medical History:  Diagnosis Date  . Bipolar 1 disorder (HCC)   . Manic depression (HCC)   . Seizure [facility])     Patient Active Problem List   Diagnosis Date Noted  . Polysubstance abuse (HCC)   . Bipolar 1 disorder (HCC) 12/22/2017  . Personal history of nicotine dependence 11/23/2015  . Acute hepatic failure 10/25/2015  . Disseminated intravascular coagulation (HCC) 10/25/2015  . 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 Severe episode of recurrent major depressive disorder, without psychotic features (HCC) 10/25/2015  . Hepatic injury 10/24/2015  . Severe recurrent major depression without psychotic features (HCC) 09/12/2015  . Alcohol use disorder, severe, dependence (HCC) 08/31/2015  . Alcohol withdrawal (HCC) 08/31/2015  . Stimulant use disorder (cocaine) 08/31/2015  . 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 Stimulant abuse (HCC) 08/31/2015    Past Surgical History:  Procedure Laterality Date  . TUBAL LIGATION Bilateral     Prior to Admission medications   Medication Sig Start Date End Date Taking? Authorizing Provider  divalproex (DEPAKOTE ER) 500 MG 24 hr tablet Take 1 tablet (500 mg total) by mouth 2 (two) times  daily. 12/28/17   Pucilowska, Jolanta B, MD  pantoprazole (PROTONIX) 40 MG tablet Take 1 tablet (40 mg total) by mouth daily. 12/28/17 01/27/18  Pucilowska, Ellin Goodie, MD  traZODone (DESYREL) 150 MG tablet Take 1 tablet (150 mg total) by mouth at bedtime. 12/28/17   Pucilowska, Ellin Goodie, MD    Allergies Patient has no known allergies.  No family history on file.  Social History Social History   Tobacco Use  . Smoking status: Never Smoker  . Smokeless tobacco: Never Used  Substance Use Topics  . Alcohol use: Yes    Comment: pt smells strongly of ETOH  . Drug use: Yes    Types: "Crack" cocaine, Benzodiazepines    Review of Systems Constitutional: No fever/chills Eyes: No visual changes. ENT: No sore throat. Cardiovascular: Denies chest pain. Respiratory: Denies shortness of breath. Gastrointestinal: No abdominal pain.  No nausea, no vomiting.  No diarrhea.  No constipation. Genitourinary: Negative for dysuria. Musculoskeletal: Negative for neck pain.  Negative for back pain. Integumentary: Negative for rash. Neurological: Negative for headaches, focal weakness or numbness. Psychiatric:Positive for substance abuse and suicidal ideation.   ____________________________________________   PHYSICAL EXAM:  VITAL SIGNS: ED Triage Vitals [08/22/18 0312]  Enc Vitals Group     BP      Pulse      Resp      Temp      Temp src      SpO2      Weight 63.5 kg (140 lb)     Height 1.6 m (5\' 3" )     Head Circumference      Peak Flow  Pain Score 0     Pain Loc      Pain Edu?      Excl. in GC?     Constitutional: Aggressive, verbally abusive agitated.  EtOH on breath appears intoxicated. Eyes: Conjunctivae are normal. Head: Atraumatic. Mouth/Throat: Mucous membranes are moist. Oropharynx non-erythematous. Neck: No stridor.   Cardiovascular: Normal rate, regular rhythm. Good peripheral circulation. Grossly normal heart sounds. Respiratory: Normal respiratory effort.  No  retractions. Lungs CTAB. Gastrointestinal: Soft and nontender. No distention.  Musculoskeletal: No lower extremity tenderness nor edema. No gross deformities of extremities. Neurologic:  Normal speech and language. No gross focal neurologic deficits are appreciated.  Skin:  Skin is warm, dry and intact. No rash noted. Psychiatric: Aggressive and verbally abusive EtOH on breath appears intoxicated. ____________________________________________   LABS (all labs ordered are listed, but only abnormal results are displayed)  Labs Reviewed  COMPREHENSIVE METABOLIC PANEL - Abnormal; Notable for the following components:      Result Value   CO2 21 (*)    Total Protein 8.7 (*)    AST 114 (*)    All other components within normal limits  ETHANOL - Abnormal; Notable for the following components:   Alcohol, Ethyl (B) 330 (*)    All other components within normal limits  ACETAMINOPHEN LEVEL - Abnormal; Notable for the following components:   Acetaminophen (Tylenol), Serum <10 (*)    All other components within normal limits  CBC - Abnormal; Notable for the following components:   Hemoglobin 10.0 (*)    HCT 28.2 (*)    MCV 64.4 (*)    MCH 22.7 (*)    RDW 21.5 (*)    All other components within normal limits  URINE DRUG SCREEN, QUALITATIVE ([facility] ONLY) - Abnormal; Notable for the following components:   Cocaine Metabolite,Ur [place] POSITIVE (*)    Cannabinoid 50 Ng, Ur [place] POSITIVE (*)    All other components within normal limits  SALICYLATE LEVEL  POC URINE PREG, ED       Procedures   ____________________________________________   INITIAL IMPRESSION / ASSESSMENT AND PLAN / ED COURSE  As part of my medical decision making, I reviewed the following data within the electronic MEDICAL RECORD NUMBER   42 year old female presenting with above-stated history and physical exam secondary to polysubstance abuse and suicidal ideation.  Patient alcohol level 330+ for cocaine and marijuana.  Awaiting  psychiatry consultation at this time. ____________________________________________  FINAL CLINICAL IMPRESSION(S) / ED DIAGNOSES  Final diagnoses:  Polysubstance abuse (HCC)  Suicidal ideation     MEDICATIONS GIVEN DURING THIS VISIT:  Medications  diphenhydrAMINE (BENADRYL) injection 50 mg (50 mg Intramuscular Given 08/22/18 0400)  LORazepam (ATIVAN) injection 2 mg (2 mg Intramuscular Given 08/22/18 0400)  haloperidol lactate (HALDOL) injection 5 mg (5 mg Intramuscular Given 08/22/18 0400)     ED Discharge Orders    None       Note:  This document was prepared using Dragon voice recognition software and may include unintentional dictation errors.    Darci Current, MD 08/22/18 [phone]

## 2018-08-22 NOTE — ED Notes (Signed)
SOC rescinded IVC recommend D/C after consult completed

## 2018-08-22 NOTE — ED Notes (Signed)
Patient talking to SOC Psychiatrist. 

## 2018-08-22 NOTE — ED Notes (Signed)
Pt. Via [employer] officer for Screaming and yelling out in public, "I want to kill myself".  Pt. Also talking to a husband who was not on scene, yelling saying he was the reason she wants to kill herself.

## 2018-08-22 NOTE — ED Notes (Signed)
Patient alert, warm and dry, in no acute distress. Patient denies HI, VH and pain. Patient states "maybe" when asked if she has SI. Patient made aware of Q15 minute rounds and Psychologist, counselling presence for their safety. Patient instructed to come to me with needs or concerns.

## 2018-08-23 LAB — PREGNANCY, URINE: Preg Test, Ur: NEGATIVE

## 2018-08-23 NOTE — ED Notes (Signed)
Discussed with Flow Coordinator Irwin Brakeman after patient refused to leave or call for a ride. Will attempt to discharge to lobby.

## 2018-08-23 NOTE — ED Notes (Signed)
Hourly rounding reveals patient sleeping in room. No complaints, stable, in no acute distress. Q15 minute rounds and monitoring via Rover and Officer to continue.  

## 2018-08-23 NOTE — ED Notes (Signed)
Patient escorted to lobby. Pt. Refused vitals, instructions, and to sign for same. States "I'll be back".

## 2018-08-23 NOTE — ED Notes (Signed)
Patient getting dressed to be discharged and to lobby.

## 2018-08-23 NOTE — ED Notes (Signed)
Pt. Refuses to call for transport home or give [place] phone number to call. She further states she is still suicidal and can't leave feeling that way.

## 2018-08-23 NOTE — ED Provider Notes (Signed)
I reevaluated the patient and discussed that the specialist on-call felt she was stable for outpatient management.  She told me she is currently homeless and the reason she told Dr. Darnelle Catalan that she was suicidal was because she did not want to sleep on the street.  There is no indication for continued emergency department stay at this point and she will be discharged to the community on her own recognizance.   Merrily Brittle, MD 08/23/18 [phone]

## 2019-02-11 ENCOUNTER — Telehealth: Payer: Self-pay | Admitting: Pharmacy Technician

## 2019-02-11 NOTE — Telephone Encounter (Signed)
Received 2020 proof of income.  Patient eligible to receive medication assistance at [facility] as long as eligibility requirements continue to be met.  [facility]

## 2019-02-22 ENCOUNTER — Encounter: Payer: Self-pay | Admitting: Gerontology

## 2019-02-22 ENCOUNTER — Other Ambulatory Visit: Payer: Self-pay

## 2019-02-22 ENCOUNTER — Ambulatory Visit: Payer: Self-pay | Admitting: Gerontology

## 2019-02-22 VITALS — BP 111/75 | HR 94 | Ht 63.0 in | Wt 142.7 lb

## 2019-02-22 DIAGNOSIS — N921 Excessive and frequent menstruation with irregular cycle: Secondary | ICD-10-CM

## 2019-02-22 DIAGNOSIS — Z8659 Personal history of other mental and behavioral disorders: Secondary | ICD-10-CM

## 2019-02-22 DIAGNOSIS — Z7689 Persons encountering health services in other specified circumstances: Secondary | ICD-10-CM

## 2019-02-22 DIAGNOSIS — Z0000 Encounter for general adult medical examination without abnormal findings: Secondary | ICD-10-CM

## 2019-02-22 DIAGNOSIS — Z87898 Personal history of other specified conditions: Secondary | ICD-10-CM

## 2019-02-22 DIAGNOSIS — K2100 Gastro-esophageal reflux disease with esophagitis, without bleeding: Secondary | ICD-10-CM

## 2019-02-22 MED ORDER — PANTOPRAZOLE SODIUM 40 MG PO TBEC: 40.0000 mg | DELAYED_RELEASE_TABLET | Freq: Every day | ORAL | 6 refills | Status: DC

## 2019-02-22 NOTE — Progress Notes (Signed)
Patient ID: Kara Lawrence, female   DOB: January 27, 1976, 43 y.o.   MRN: [id number]  Chief Complaint  Patient presents with  . New Patient (Initial Visit)    establish care    HPI Kara Lawrence is a 43 y.o. female presents to establish care and evaluation of heavy menstrual flow. She states that she experiences heavy menstrual bleeding with clots and abdominal cramps  that lasts for 7- 8 days.  She reports that she takes 200 mg ibuprofen which relieves abdominal cramp she states that her period is irregular. She reports that her sister and mother has history of fibroid. She states that she has never had a mammogram or Pap smear done.  History of Bipolar and Depression: She reports that she takes trazodone 150 mg at bedtime and Depakote 500 mg bid . She follows Dr Ernie Hew at [facility] for mental health care. She states that her mood is good and denies suicidal or homicidal ideation.  History of GERD: She reports that she takes Protonix 400 mg daily which relieves her acid reflux.  Seizure: She states that she has a history of seizure and unable to remember her last seizure activity . She reports taking depakote 500 mg bid. She denies chest pain, palpitation, shorthness of breath, fever, chills and otherwise denies further concerns.    Past Medical History:  Diagnosis Date  . Allergy   . Bipolar 1 disorder ([place])   . Depression   . Manic depression ([place])   . Seizure [facility])     Past Surgical History:  Procedure Laterality Date  . TUBAL LIGATION Bilateral     No family history on file.  Social History Social History   Tobacco Use  . Smoking status: Never Smoker  . Smokeless tobacco: Never Used  Substance Use Topics  . Alcohol use: Yes    Comment: drinks a couple of beers a week  . Drug use: Not Currently    Types: "Crack" cocaine, Benzodiazepines    No Known Allergies  Current Outpatient Medications  Medication Sig Dispense Refill  . divalproex (DEPAKOTE ER) 500  MG 24 hr tablet Take 1 tablet (500 mg total) by mouth 2 (two) times daily. 60 tablet 2  . traZODone (DESYREL) 150 MG tablet Take 1 tablet (150 mg total) by mouth at bedtime. 30 tablet 2  . pantoprazole (PROTONIX) 40 MG tablet Take 1 tablet (40 mg total) by mouth daily. 30 tablet 6   No current facility-administered medications for this visit.     Review of Systems Review of Systems  Constitutional: Negative.   HENT: Negative.   Eyes: Positive for visual disturbance.  Respiratory: Negative.   Cardiovascular: Negative.   Gastrointestinal: Negative.   Genitourinary: Positive for menstrual problem (heavy bleeding).  Musculoskeletal: Negative.   Skin: Negative.   Neurological: Negative.   Psychiatric/Behavioral: Negative.     Blood pressure 111/75, pulse 94, height 5' 3" (1.6 m), weight 142 lb 11.2 oz (64.7 kg), SpO2 98 %.  Physical Exam Physical Exam Constitutional:      Appearance: Normal appearance.  HENT:     Head: Normocephalic.     Mouth/Throat:     Mouth: Mucous membranes are moist.  Eyes:     Extraocular Movements: Extraocular movements intact.     Pupils: Pupils are equal, round, and reactive to light.  Cardiovascular:     Rate and Rhythm: Normal rate.     Pulses: Normal pulses.     Heart sounds: Normal heart sounds.  Pulmonary:  Effort: Pulmonary effort is normal.     Breath sounds: Normal breath sounds.  Abdominal:     General: Bowel sounds are normal.     Palpations: Abdomen is soft.  Genitourinary:    Comments: Deferred per patient Musculoskeletal: Normal range of motion.  Skin:    General: Skin is warm.  Neurological:     General: No focal deficit present.     Mental Status: She is alert and oriented to person, place, and time.  Psychiatric:        Mood and Affect: Mood normal.        Behavior: Behavior normal.        Thought Content: Thought content normal.        Judgment: Judgment normal.     Data Reviewed -Medical history and routine labs  was reviewed.  Assessment and Plan 1. Encounter to establish care -The following labs will be collected during visit - CBC w/Diff; Future - Comp Met (CMET); Future - Lipid panel; Future - HgB A1c; Future - TSH; Future - Urinalysis; Future  2. Gastroesophageal reflux disease with esophagitis -Acid reflux is under control and she will continue on 40 mg Protonix daily - pantoprazole (PROTONIX) 40 MG tablet; Take 1 tablet (40 mg total) by mouth daily for 30 days.  Dispense: 30 tablet; Refill: 6  3. History of seizure -Seizure is under control and she will continue on current treatment regimen.  4. History of depression -She will continue her follow-up at [facility] with Dr. Randel Books  5. History of bipolar disorder -Same as same #4  6. Menorrhagia with irregular cycle -She was encouraged to take 200 mg ibuprofen every 8 hours for abdominal cramp. - Ambulatory referral to Obstetrics / Gynecology  7. Health care maintenance -Ophthalmology exam was scheduled -She was provided with the [facility]CP information to schedule mammogram and Pap smear.   Follow Up: She will follow-up in 3 months or if symptoms worsen.     Tywone Bembenek E Abanoub Hanken 02/22/2019, 2:51 PM

## 2019-02-23 ENCOUNTER — Other Ambulatory Visit: Payer: Self-pay

## 2019-02-24 ENCOUNTER — Ambulatory Visit: Payer: Self-pay | Admitting: Ophthalmology

## 2019-02-24 LAB — COMPREHENSIVE METABOLIC PANEL
ALT: 54 IU/L — ABNORMAL HIGH (ref 0–32)
AST: 96 IU/L — ABNORMAL HIGH (ref 0–40)
Albumin/Globulin Ratio: 1.1 — ABNORMAL LOW (ref 1.2–2.2)
Albumin: 4.1 g/dL (ref 3.8–4.8)
Alkaline Phosphatase: 76 IU/L (ref 39–117)
BUN/Creatinine Ratio: 12 (ref 9–23)
BUN: 9 mg/dL (ref 6–24)
Bilirubin Total: 0.3 mg/dL (ref 0.0–1.2)
CALCIUM: 8.9 mg/dL (ref 8.7–10.2)
CO2: 16 mmol/L — ABNORMAL LOW (ref 20–29)
Chloride: 102 mmol/L (ref 96–106)
Creatinine, Ser: 0.73 mg/dL (ref 0.57–1.00)
GFR calc Af Amer: 116 mL/min/{1.73_m2} (ref >59)
GFR calc non Af Amer: 100 mL/min/{1.73_m2} (ref >59)
Globulin, Total: 3.9 g/dL (ref 1.5–4.5)
Sodium: 138 mmol/L (ref 134–144)
Total Protein: 8 g/dL (ref 6.0–8.5)

## 2019-02-24 LAB — CBC WITH DIFFERENTIAL/PLATELET
Basophils Absolute: 0 10*3/uL (ref 0.0–0.2)
Basos: 1 %
EOS (ABSOLUTE): 0.1 10*3/uL (ref 0.0–0.4)
Eos: 1 %
HEMOGLOBIN: 9.3 g/dL — AB (ref 11.1–15.9)
Hematocrit: 29.8 % — ABNORMAL LOW (ref 34.0–46.6)
Immature Grans (Abs): 0 10*3/uL (ref 0.0–0.1)
Immature Granulocytes: 0 %
Lymphocytes Absolute: 1.9 10*3/uL (ref 0.7–3.1)
Lymphs: 37 %
MCH: 19.6 pg — ABNORMAL LOW (ref 26.6–33.0)
MCHC: 31.2 g/dL — ABNORMAL LOW (ref 31.5–35.7)
MCV: 63 fL — AB (ref 79–97)
MONOCYTES: 18 %
Monocytes Absolute: 0.9 10*3/uL (ref 0.1–0.9)
Neutrophils Absolute: 2.2 10*3/uL (ref 1.4–7.0)
Neutrophils: 43 %
Platelets: 419 10*3/uL (ref 150–450)
RBC: 4.75 x10E6/uL (ref 3.77–5.28)
RDW: 20 % — ABNORMAL HIGH (ref 11.7–15.4)
WBC: 5 10*3/uL (ref 3.4–10.8)

## 2019-02-24 LAB — LIPID PANEL
Chol/HDL Ratio: 1.6 ratio (ref 0.0–4.4)
Cholesterol, Total: 158 mg/dL (ref 100–199)
HDL: 97 mg/dL (ref >39)
LDL Calculated: 51 mg/dL (ref 0–99)
Triglycerides: 51 mg/dL (ref 0–149)
VLDL Cholesterol Cal: 10 mg/dL (ref 5–40)

## 2019-02-24 LAB — TSH: TSH: 3.19 u[IU]/mL (ref 0.450–4.500)

## 2019-02-24 LAB — HEMOGLOBIN A1C
Est. average glucose Bld gHb Est-mCnc: 94 mg/dL
HEMOGLOBIN A1C: 4.9 % (ref 4.8–5.6)

## 2019-03-10 ENCOUNTER — Ambulatory Visit: Payer: Self-pay | Admitting: Gerontology

## 2019-03-10 ENCOUNTER — Ambulatory Visit: Payer: Self-pay | Admitting: Ophthalmology

## 2019-03-20 ENCOUNTER — Emergency Department
Admission: EM | Admit: 2019-03-20 | Discharge: 2019-03-21 | Disposition: A | Discharge status: Home / Self Care | Payer: Self-pay | Attending: Emergency Medicine | Admitting: Emergency Medicine

## 2019-03-20 ENCOUNTER — Encounter: Payer: Self-pay | Admitting: Emergency Medicine

## 2019-03-20 ENCOUNTER — Other Ambulatory Visit: Payer: Self-pay

## 2019-03-20 DIAGNOSIS — S36119A Unspecified injury of liver, initial encounter: Secondary | ICD-10-CM | POA: Diagnosis present

## 2019-03-20 DIAGNOSIS — F1020 Alcohol dependence, uncomplicated: Secondary | ICD-10-CM | POA: Diagnosis present

## 2019-03-20 DIAGNOSIS — F1910 Other psychoactive substance abuse, uncomplicated: Secondary | ICD-10-CM | POA: Diagnosis present

## 2019-03-20 DIAGNOSIS — R45851 Suicidal ideations: Secondary | ICD-10-CM

## 2019-03-20 DIAGNOSIS — F319 Bipolar disorder, unspecified: Secondary | ICD-10-CM | POA: Diagnosis present

## 2019-03-20 DIAGNOSIS — F1590 Other stimulant use, unspecified, uncomplicated: Secondary | ICD-10-CM | POA: Diagnosis present

## 2019-03-20 DIAGNOSIS — R451 Restlessness and agitation: Secondary | ICD-10-CM

## 2019-03-20 DIAGNOSIS — R440 Auditory hallucinations: Secondary | ICD-10-CM | POA: Insufficient documentation

## 2019-03-20 DIAGNOSIS — Z79899 Other long term (current) drug therapy: Secondary | ICD-10-CM | POA: Insufficient documentation

## 2019-03-20 LAB — URINE DRUG SCREEN, QUALITATIVE (ARMC ONLY)
Amphetamines, Ur Screen: NOT DETECTED
Barbiturates, Ur Screen: NOT DETECTED
Benzodiazepine, Ur Scrn: NOT DETECTED
Cannabinoid 50 Ng, Ur ~~LOC~~: POSITIVE — AB
Cocaine Metabolite,Ur ~~LOC~~: POSITIVE — AB
MDMA (Ecstasy)Ur Screen: NOT DETECTED
Methadone Scn, Ur: NOT DETECTED
Opiate, Ur Screen: NOT DETECTED
Phencyclidine (PCP) Ur S: NOT DETECTED
Tricyclic, Ur Screen: NOT DETECTED

## 2019-03-20 LAB — PREGNANCY, URINE: Preg Test, Ur: NEGATIVE

## 2019-03-20 MED ORDER — DIPHENHYDRAMINE HCL 50 MG/ML IJ SOLN
INTRAMUSCULAR | Status: AC
  Administered 2019-03-20: 22:00:00 50 mg via INTRAMUSCULAR
  Filled 2019-03-20: qty 1

## 2019-03-20 MED ORDER — HALOPERIDOL LACTATE 5 MG/ML IJ SOLN
5.0000 mg | Freq: Once | INTRAMUSCULAR | Status: AC
  Administered 2019-03-20: 5 mg via INTRAMUSCULAR

## 2019-03-20 MED ORDER — DIPHENHYDRAMINE HCL 50 MG/ML IJ SOLN
50.0000 mg | Freq: Once | INTRAMUSCULAR | Status: AC
  Administered 2019-03-20: 50 mg via INTRAMUSCULAR

## 2019-03-20 MED ORDER — HALOPERIDOL LACTATE 5 MG/ML IJ SOLN
INTRAMUSCULAR | Status: AC
  Administered 2019-03-20: 22:00:00 5 mg via INTRAMUSCULAR
  Filled 2019-03-20: qty 1

## 2019-03-20 MED ORDER — LORAZEPAM 2 MG/ML IJ SOLN
2.0000 mg | Freq: Once | INTRAMUSCULAR | Status: AC
  Administered 2019-03-20: 22:00:00 2 mg via INTRAVENOUS

## 2019-03-20 MED ORDER — LORAZEPAM 2 MG/ML IJ SOLN
INTRAMUSCULAR | Status: AC
  Administered 2019-03-20: 22:00:00 2 mg via INTRAVENOUS
  Filled 2019-03-20: qty 1

## 2019-03-20 NOTE — ED Provider Notes (Signed)
[facility] Emergency Department Provider Note  ____________________________________________  Time seen: Approximately 10:33 PM  I have reviewed the triage vital signs and the nursing notes.   HISTORY  Chief Complaint Suicidal ideation   Level 5 Caveat: Portions of the History and Physical including HPI and review of systems are unable to be completely obtained due to patient being a poor historian and combativeness  HPI Kara Lawrence is a 43 y.o. female with a history of bipolar disorder, polysubstance abuse who is brought to the ED under involuntary commitment by police due to suicidal ideation.  Patient also endorses auditory hallucinations.  She seems to suggest that her husband was afraid of her and called police for help tonight.    She does not provide any other history or answer any other questions but only says "can I go home now" with any other questions.     Past Medical History:  Diagnosis Date  . Allergy   . Bipolar 1 disorder (HCC)   . Depression   . Manic depression (HCC)   . Seizure [facility])      Patient Active Problem List   Diagnosis Date Noted  . Polysubstance abuse (HCC)   . Bipolar 1 disorder (HCC) 12/22/2017  . Personal history of nicotine dependence 11/23/2015  . Acute hepatic failure 10/25/2015  . Disseminated intravascular coagulation (HCC) 10/25/2015  . 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 Severe episode of recurrent major depressive disorder, without psychotic features (HCC) 10/25/2015  . Hepatic injury 10/24/2015  . Severe recurrent major depression without psychotic features (HCC) 09/12/2015  . Alcohol use disorder, severe, dependence (HCC) 08/31/2015  . Alcohol withdrawal (HCC) 08/31/2015  . Stimulant use disorder (cocaine) 08/31/2015  . 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 Stimulant abuse (HCC) 08/31/2015     Past Surgical History:  Procedure  Laterality Date  . TUBAL LIGATION Bilateral      Prior to Admission medications   Medication Sig Start Date End Date Taking? Authorizing Provider  divalproex (DEPAKOTE ER) 500 MG 24 hr tablet Take 1 tablet (500 mg total) by mouth 2 (two) times daily. 12/28/17   Pucilowska, Jolanta B, MD  pantoprazole (PROTONIX) 40 MG tablet Take 1 tablet (40 mg total) by mouth daily for 30 days. 02/22/19 03/24/19  Iloabachie, Chioma E, NP  traZODone (DESYREL) 150 MG tablet Take 1 tablet (150 mg total) by mouth at bedtime. 12/28/17   Pucilowska, Ellin Goodie, MD     Allergies Patient has no known allergies.   No family history on file.  Social History Social History   Tobacco Use  . Smoking status: Never Smoker  . Smokeless tobacco: Never Used  Substance Use Topics  . Alcohol use: Yes    Comment: drinks a couple of beers a week  . Drug use: Not Currently    Types: "Crack" cocaine, Benzodiazepines    Review of Systems Level 5 Caveat: Portions of the History and Physical including HPI and review of systems are unable to be completely obtained due to patient being a poor historian   Constitutional:   No known fever.  ENT:   No rhinorrhea. Cardiovascular:   No chest pain or syncope. Respiratory:   No dyspnea or cough. Gastrointestinal:   Negative for abdominal pain, vomiting and diarrhea.  Musculoskeletal:   Negative for focal pain or swelling ____________________________________________   PHYSICAL EXAM:  VITAL SIGNS: ED Triage Vitals [03/20/19 2157]  Enc Vitals Group     BP  Pulse      Resp      Temp      Temp src      SpO2      Weight 170 lb (77.1 kg)     Height  (1.651 m)     Head Circumference      Peak Flow      Pain Score 0     Pain Loc      Pain Edu?      Excl. in GC?     Vital signs reviewed, nursing assessments reviewed.   Constitutional:   Awake and alert.  Unable to test orientation due to uncooperativeness.  Non-toxic appearance. Eyes:   Conjunctivae are  normal. EOMI.  ENT      Head:   Normocephalic and atraumatic.        Neck:   No meningismus. Full ROM. Hematological/Lymphatic/Immunilogical:   No cervical lymphadenopathy. Cardiovascular:   RRR. Symmetric bilateral radial and DP pulses.  No murmurs. Cap refill less than 2 seconds. Respiratory:   Normal respiratory effort without tachypnea/retractions. Breath sounds are clear and equal bilaterally. No wheezes/rales/rhonchi. Gastrointestinal:   Soft and nontender. Non distended. There is no CVA tenderness.  No rebound, rigidity, or guarding. Musculoskeletal:   Normal range of motion in all extremities. No joint effusions.  No lower extremity tenderness.  No edema. Neurologic:   Normal speech, limited language expression likely related to uncooperativeness Motor grossly intact. No acute focal neurologic deficits are appreciated.  Skin:    Skin is warm, dry and intact. No rash noted.  No petechiae, purpura, or bullae.  ____________________________________________    LABS (pertinent positives/negatives) (all labs ordered are listed, but only abnormal results are displayed) Labs Reviewed  PREGNANCY, URINE  ACETAMINOPHEN LEVEL  COMPREHENSIVE METABOLIC PANEL  ETHANOL  SALICYLATE LEVEL  CBC WITH DIFFERENTIAL/PLATELET  URINE DRUG SCREEN, QUALITATIVE ([facility] ONLY)   ____________________________________________   EKG    ____________________________________________    RADIOLOGY  No results found.  ____________________________________________   PROCEDURES Procedures  ____________________________________________    CLINICAL IMPRESSION / ASSESSMENT AND PLAN / ED COURSE  Pertinent labs & imaging results that were available during my care of the patient were reviewed by me and considered in my medical decision making (see chart for details).   Kara Lawrence was evaluated in Emergency Department on 03/20/2019 for the symptoms described in the history of present  illness. She was evaluated in the context of the global COVID-19 pandemic, which necessitated consideration that the patient might be at risk for infection with the SARS-CoV-2 virus that causes COVID-19. Institutional protocols and algorithms that pertain to the evaluation of patients at risk for COVID-19 are in a state of rapid change based on information released by regulatory bodies including the CDC and federal and state organizations. These policies and algorithms were followed during the patient's care in the ED.     Clinical Course as of Mar 19 2232  Wynelle Link Mar 20, 2019  2204 Patient arrives under involuntary commitment by law enforcement in handcuffs.  Patient is combative and uncooperative, refusing to sit down or answer questions.  She does endorse auditory hallucinations.  Patient will be given Haldol Benadryl and Ativan to calm her and help treat her acute psychosis.  Continue IVC until able to obtain psychiatry evaluation.  Last ED visit was significant for polysubstance abuse which may be underlying today symptoms as well.  Will check labs.  Appears to be medically stable.   [PS]  Clinical Course User Index [PS] Sharman Cheek, MD     ____________________________________________   FINAL CLINICAL IMPRESSION(S) / ED DIAGNOSES    Final diagnoses:  Agitation  Auditory hallucinations     ED Discharge Orders    None      Portions of this note were generated with dragon dictation software. Dictation errors may occur despite best attempts at proofreading.   Sharman Cheek, MD 03/20/19 2236

## 2019-03-20 NOTE — ED Triage Notes (Signed)
Patient brought in by [employer] for SI. Verbally abusive.

## 2019-03-20 NOTE — ED Notes (Signed)
Patient irritable and aggressive toward staff.  Patient placed in room with handcuffs on.

## 2019-03-21 DIAGNOSIS — F319 Bipolar disorder, unspecified: Secondary | ICD-10-CM

## 2019-03-21 DIAGNOSIS — F1590 Other stimulant use, unspecified, uncomplicated: Secondary | ICD-10-CM

## 2019-03-21 DIAGNOSIS — F1910 Other psychoactive substance abuse, uncomplicated: Secondary | ICD-10-CM

## 2019-03-21 DIAGNOSIS — R45851 Suicidal ideations: Secondary | ICD-10-CM

## 2019-03-21 DIAGNOSIS — F1020 Alcohol dependence, uncomplicated: Secondary | ICD-10-CM

## 2019-03-21 LAB — COMPREHENSIVE METABOLIC PANEL
ALT: 141 U/L — ABNORMAL HIGH (ref 0–44)
AST: 235 U/L — ABNORMAL HIGH (ref 15–41)
Albumin: 3.8 g/dL (ref 3.5–5.0)
Alkaline Phosphatase: 81 U/L (ref 38–126)
Anion gap: 10 (ref 5–15)
BUN: 8 mg/dL (ref 6–20)
CO2: 24 mmol/L (ref 22–32)
Calcium: 8.6 mg/dL — ABNORMAL LOW (ref 8.9–10.3)
Chloride: 105 mmol/L (ref 98–111)
Creatinine, Ser: 0.82 mg/dL (ref 0.44–1.00)
GFR calc Af Amer: >60 mL/min (ref >60)
GFR calc non Af Amer: >60 mL/min (ref >60)
Glucose, Bld: 94 mg/dL (ref 70–99)
Potassium: 3.7 mmol/L (ref 3.5–5.1)
Sodium: 139 mmol/L (ref 135–145)
Total Bilirubin: 0.5 mg/dL (ref 0.3–1.2)
Total Protein: 8.3 g/dL — ABNORMAL HIGH (ref 6.5–8.1)

## 2019-03-21 LAB — CBC WITH DIFFERENTIAL/PLATELET
Abs Immature Granulocytes: 0.02 10*3/uL (ref 0.00–0.07)
Basophils Absolute: 0 10*3/uL (ref 0.0–0.1)
Basophils Relative: 1 %
Eosinophils Absolute: 0 10*3/uL (ref 0.0–0.5)
Eosinophils Relative: 0 %
HCT: 29.7 % — ABNORMAL LOW (ref 36.0–46.0)
Hemoglobin: 9.2 g/dL — ABNORMAL LOW (ref 12.0–15.0)
Immature Granulocytes: 0 %
Lymphocytes Relative: 31 %
Lymphs Abs: 1.7 10*3/uL (ref 0.7–4.0)
MCH: 19.9 pg — ABNORMAL LOW (ref 26.0–34.0)
MCHC: 31 g/dL (ref 30.0–36.0)
MCV: 64.3 fL — ABNORMAL LOW (ref 80.0–100.0)
Monocytes Absolute: 0.5 10*3/uL (ref 0.1–1.0)
Monocytes Relative: 10 %
Neutro Abs: 3.2 10*3/uL (ref 1.7–7.7)
Neutrophils Relative %: 58 %
Platelets: 495 10*3/uL — ABNORMAL HIGH (ref 150–400)
RBC: 4.62 MIL/uL (ref 3.87–5.11)
RDW: 18.1 % — ABNORMAL HIGH (ref 11.5–15.5)
WBC: 5.5 10*3/uL (ref 4.0–10.5)
nRBC: 0 % (ref 0.0–0.2)

## 2019-03-21 LAB — ETHANOL: Alcohol, Ethyl (B): 271 mg/dL — ABNORMAL HIGH (ref <10)

## 2019-03-21 LAB — VALPROIC ACID LEVEL: Valproic Acid Lvl: <10 ug/mL — ABNORMAL LOW (ref 50.0–100.0)

## 2019-03-21 LAB — SALICYLATE LEVEL: Salicylate Lvl: <7 mg/dL (ref 2.8–30.0)

## 2019-03-21 LAB — ACETAMINOPHEN LEVEL: Acetaminophen (Tylenol), Serum: <10 ug/mL — ABNORMAL LOW (ref 10–30)

## 2019-03-21 MED ORDER — DIVALPROEX SODIUM 500 MG PO DR TAB
500.0000 mg | DELAYED_RELEASE_TABLET | Freq: Two times a day (BID) | ORAL | Status: DC
  Administered 2019-03-21: 13:00:00 500 mg via ORAL
  Filled 2019-03-21: qty 1

## 2019-03-21 MED ORDER — PANTOPRAZOLE SODIUM 40 MG PO TBEC
40.0000 mg | DELAYED_RELEASE_TABLET | Freq: Every day | ORAL | Status: DC
  Administered 2019-03-21: 13:00:00 40 mg via ORAL
  Filled 2019-03-21: qty 1

## 2019-03-21 MED ORDER — TRAZODONE HCL 50 MG PO TABS: 150.0000 mg | ORAL_TABLET | Freq: Every day | ORAL | Status: DC

## 2019-03-21 MED ORDER — TRAZODONE HCL 50 MG PO TABS: 50.0000 mg | ORAL_TABLET | Freq: Every day | ORAL | Status: DC

## 2019-03-21 NOTE — ED Notes (Signed)
BEHAVIORAL HEALTH ROUNDING Patient sleeping: Yes.   Patient alert and oriented: eyes closed  Appears to be asleep Behavior appropriate: Yes.  ; If no, describe:  Nutrition and fluids offered: Yes  Toileting and hygiene offered: sleeping Sitter present: q 15 minute observations and security monitoring Law enforcement present: yes  ODS 

## 2019-03-21 NOTE — ED Notes (Signed)
Consultant is currently in her room

## 2019-03-21 NOTE — ED Notes (Signed)
BEHAVIORAL HEALTH ROUNDING Patient sleeping: No. Patient alert and oriented: yes Behavior appropriate: Yes.  ; If no, describe:  Nutrition and fluids offered: yes Toileting and hygiene offered: Yes  Sitter present: q15 minute observations and security  monitoring Law enforcement present: Yes  ODS  

## 2019-03-21 NOTE — ED Notes (Signed)
ED  Is the patient under IVC or is there intent for IVC: Yes.   Is the patient medically cleared: Yes.   Is there vacancy in the ED BHU: Yes.   Is the population mix appropriate for patient: Yes.   Is the patient awaiting placement in inpatient or outpatient setting:  Has the patient had a psychiatric consult:  Consult pending  Survey of unit performed for contraband, proper placement and condition of furniture, tampering with fixtures in bathroom, shower, and each patient room: Yes.  ; Findings:  APPEARANCE/BEHAVIOR Calm and cooperative NEURO ASSESSMENT Orientation: oriented x3  Denies pain Hallucinations: No.None noted (Hallucinations) denies  Speech: Normal Gait: normal RESPIRATORY ASSESSMENT Even  Unlabored respirations  CARDIOVASCULAR ASSESSMENT Pulses equal   regular rate  Skin warm and dry   GASTROINTESTINAL ASSESSMENT no GI complaint EXTREMITIES Full ROM  PLAN OF CARE Provide calm/safe environment. Vital signs assessed twice daily. ED BHU Assessment once each 12-hour shift. Collaborate with TTS when available  or as condition indicates. Assure the ED provider has rounded once each shift. Provide and encourage hygiene. Provide redirection as needed. Assess for escalating behavior; address immediately and inform ED provider.  Assess family dynamic and appropriateness for visitation as needed: Yes.  ; If necessary, describe findings:  Educate the patient/family about BHU procedures/visitation: Yes.  ; If necessary, describe findings:

## 2019-03-21 NOTE — ED Notes (Signed)
Gave food tray,juice, and ginger ale. Pt is eating and watching TV.

## 2019-03-21 NOTE — ED Notes (Signed)

## 2019-03-21 NOTE — Consult Note (Signed)
[facility] Face-to-Face Psychiatry Consult   Reason for Consult: Suicidal with history of mood disorder and substance abuse who came into the hospital intoxicated Referring Physician: Dr. Cyril Loosen Patient Identification: Kara Lawrence MRN:  [id number] Principal Diagnosis: Verbalizes suicidal thoughts Diagnosis:   Patient Active Problem List   Diagnosis Date Noted  . Verbalizes suicidal thoughts [R45.851] 03/21/2019  . Polysubstance abuse (HCC) [F19.10]   . Bipolar 1 disorder (HCC) [F31.9] 12/22/2017  . Personal history of nicotine dependence [Z87.891] 11/23/2015  . Acute hepatic failure [K72.00] 10/25/2015  . 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 Severe episode of recurrent major depressive disorder, without psychotic features (HCC) [F33.2] 10/25/2015  . Acute liver failure [K72.00] 10/25/2015  . Hepatic injury [S36.119A] 10/24/2015  . Severe recurrent major depression without psychotic features (HCC) [F33.2] 09/12/2015  . Alcohol use disorder, severe, dependence (HCC) [F10.20] 08/31/2015  . Alcohol withdrawal (HCC) [F10.239] 08/31/2015  . Stimulant use disorder (cocaine) [F15.90] 08/31/2015  . 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 Stimulant abuse (HCC) [F15.10] 08/31/2015   Patient is seen, chart is reviewed.  Collateral obtained from patient's peers support worker at [employer] Sinda Du [phone]) Total Time spent with patient: 1 hour  Subjective:   Kara Lawrence is a 43 y.o. female patient with a history of bipolar disorder, polysubstance abuse who is brought to the ED under involuntary commitment by police due to suicidal ideation.  Patient also endorses auditory hallucinations.  She seems to suggest that her husband was afraid of her and called police for help tonight.   She does not provide any other history or answer  any other questions but only says "can I go home now" with any other questions.  On evaluation, patient is calm and cooperative.  She reports that she has been staying with her significant other Maurine Minister [phone]) patient gives permission to speak with dentist as well as her peer support specialist through [facility] Sinda Du [phone]).  Patient reports that she has been off of her Depakote for a few days, and relapsed using cocaine and marijuana.  She does not recall making suicidal statements yesterday, but states that she was having a hard time at home with her significant other, who apparently called police due to her having suicidal statements.  Patient is denying any suicidal or homicidal ideation.  She is currently denying any auditory or visual hallucinations, but does endorse that she can have hallucinations when she is abusing substances.  She does not recall specifically having hallucinations last night.  She denies any command auditory hallucinations.  Patient also endorses drinking alcohol last evening.  Patient is not desiring long-term rehabilitation at this point.  She request to speak with her peers support worker to discuss other treatment options.  Patient is alert and oriented x4, she is appropriate during conversation and questioning.  Collateral was obtained from patient's peer support provider Delight Hoh [phone]) peers support provider expresses concern for patient safety with her significant other, and notes that she has heard them arguing when she has checked in with them in the past.  She also expresses concern for patient's substance use and relates that she will speak with her supervisor in order to step up patient's treatment, as she has not been able to come to the day treatment program due to lack of transportation.  Peer support provider states that patient has current prescriptions but suspects that she has been  noncompliant in taking medications.  She is able to pick  up patient from the hospital today and will ensure that her home is safe.  [facility] will work towards encouraging patient to seek long-term substance use treatment.  Per chart review: Social history: Patient lives with her boyfriend.  Not working.  Trying to get disability.  Has extended family she stays in close touch with.  Medical history: Patient knows that she has had some liver insult from her chronic heavy drinking.  Does not really follow up with any medical care.  Substance abuse history: Long-standing alcohol abuse.  Patient does have a history of seizures although she does not remember when the last one was, no clear history of DTs.  History of cocaine use as well.  Patient has not followed up with outpatient attempts to stay sober.  Past Psychiatric History: Patient has been diagnosed variously with bipolar disorder with major depression and with substance related mood disorder in the past.  She has had several prior hospitalizations and does have a prior history of suicidality.  She was prescribed Depakote and trazodone in the past which she says was helpful in keeping her stable but she has been noncompliant with it.  Risk to Self:  Denies Risk to Others:  Denies Prior Inpatient Therapy:  Yes Prior Outpatient Therapy:  Yes, actively in treatment with [facility].  Past Medical History:  Past Medical History:  Diagnosis Date  . Allergy   . Bipolar 1 disorder (HCC)   . Depression   . Manic depression (HCC)   . Seizure [facility](HCC)     Past Surgical History:  Procedure Laterality Date  . TUBAL LIGATION Bilateral    Family History: No family history on file. Family Psychiatric  History: Positive for substance abuse  Social History:  Social History   Substance and Sexual Activity  Alcohol Use Yes   Comment: drinks a couple of beers a week     Social History   Substance and Sexual Activity  Drug Use Not Currently  . Types: "Crack" cocaine, Benzodiazepines    Social  History   Socioeconomic History  . Marital status: Single    Spouse name: Not on file  . Number of children: Not on file  . Years of education: Not on file  . Highest education level: Not on file  Occupational History  . Not on file  Social Needs  . Financial resource strain: Not on file  . Food insecurity:    Worry: Not on file    Inability: Not on file  . Transportation needs:    Medical: Not on file    Non-medical: Not on file  Tobacco Use  . Smoking status: Never Smoker  . Smokeless tobacco: Never Used  Substance and Sexual Activity  . Alcohol use: Yes    Comment: drinks a couple of beers a week  . Drug use: Not Currently    Types: "Crack" cocaine, Benzodiazepines  . Sexual activity: Yes    Birth control/protection: None  Lifestyle  . Physical activity:    Days per week: Not on file    Minutes per session: Not on file  . Stress: Not on file  Relationships  . Social connections:    Talks on phone: Not on file    Gets together: Not on file    Attends religious service: Not on file    Active member of club or organization: Not on file    Attends meetings of clubs or organizations:  Not on file    Relationship status: Not on file  Other Topics Concern  . Not on file  Social History Narrative  . Not on file   Additional Social History:  Currently living with significant other, Maurine Minister Patient reports strained relationship with her grandmother at this time.  She has asked [place] not to call her grandmother. Patient feels supported by her peers support specialist through [facility].  Allergies:  No Known Allergies  Labs:  Results for orders placed or performed during the hospital encounter of 03/20/19 (from the past 48 hour(s))  Acetaminophen level     Status: Abnormal   Collection Time: 03/20/19 10:12 PM  Result Value Ref Range   Acetaminophen (Tylenol), Serum <10 (L) 10 - 30 ug/mL    Comment: (NOTE) Therapeutic concentrations vary significantly. A range of 10-30 ug/mL   may be an effective concentration for many patients. However, some  are best treated at concentrations outside of this range. Acetaminophen concentrations >150 ug/mL at 4 hours after ingestion  and >50 ug/mL at 12 hours after ingestion are often associated with  toxic reactions. Performed at [facility], [address] [place], [place] [address]   Comprehensive metabolic panel     Status: Abnormal   Collection Time: 03/20/19 10:12 PM  Result Value Ref Range   Sodium 139 135 - 145 mmol/L   Potassium 3.7 3.5 - 5.1 mmol/L   Chloride 105 98 - 111 mmol/L   CO2 24 22 - 32 mmol/L   Glucose, Bld 94 70 - 99 mg/dL   BUN 8 6 - 20 mg/dL   Creatinine, Ser 6.04 0.44 - 1.00 mg/dL   Calcium 8.6 (L) 8.9 - 10.3 mg/dL   Total Protein 8.3 (H) 6.5 - 8.1 g/dL   Albumin 3.8 3.5 - 5.0 g/dL   AST 540 (H) 15 - 41 U/L   ALT 141 (H) 0 - 44 U/L   Alkaline Phosphatase 81 38 - 126 U/L   Total Bilirubin 0.5 0.3 - 1.2 mg/dL   GFR calc non Af Amer >60 >60 mL/min   GFR calc Af Amer >60 >60 mL/min   Anion gap 10 5 - 15    Comment: Performed at [facility], [address], [place], [place] [address]  Ethanol     Status: Abnormal   Collection Time: 03/20/19 10:12 PM  Result Value Ref Range   Alcohol, Ethyl (B) 271 (H) <10 mg/dL    Comment: (NOTE) Lowest detectable limit for serum alcohol is 10 mg/dL. For medical purposes only. Performed at [facility], [address] [place], [place] [address]   Salicylate level     Status: None   Collection Time: 03/20/19 10:12 PM  Result Value Ref Range   Salicylate Lvl <7.0 2.8 - 30.0 mg/dL    Comment: Performed at [facility], [address] [place], [place] [address]  CBC with Differential     Status: Abnormal   Collection Time: 03/20/19 10:12 PM  Result Value Ref Range   WBC 5.5 4.0 - 10.5 K/uL   RBC 4.62 3.87 - 5.11 MIL/uL   Hemoglobin 9.2 (L) 12.0 - 15.0 g/dL   HCT 30.8 (L) 65.7 - 84.6 %   MCV 64.3 (L) 80.0 -  100.0 fL   MCH 19.9 (L) 26.0 - 34.0 pg   MCHC 31.0 30.0 - 36.0 g/dL   RDW 96.2 (H) 95.2 - 84.1 %   Platelets 495 (H) 150 - 400 K/uL   nRBC 0.0  0.0 - 0.2 %   Neutrophils Relative % 58 %   Neutro Abs 3.2 1.7 - 7.7 K/uL   Lymphocytes Relative 31 %   Lymphs Abs 1.7 0.7 - 4.0 K/uL   Monocytes Relative 10 %   Monocytes Absolute 0.5 0.1 - 1.0 K/uL   Eosinophils Relative 0 %   Eosinophils Absolute 0.0 0.0 - 0.5 K/uL   Basophils Relative 1 %   Basophils Absolute 0.0 0.0 - 0.1 K/uL   Immature Granulocytes 0 %   Abs Immature Granulocytes 0.02 0.00 - 0.07 K/uL    Comment: Performed at [facility], [address], [place], [place] [address]  Urine Drug Screen, Qualitative     Status: Abnormal   Collection Time: 03/20/19 10:12 PM  Result Value Ref Range   Tricyclic, Ur Screen NONE DETECTED NONE DETECTED   Amphetamines, Ur Screen NONE DETECTED NONE DETECTED   MDMA (Ecstasy)Ur Screen NONE DETECTED NONE DETECTED   Cocaine Metabolite,Ur [place] POSITIVE (A) NONE DETECTED   Opiate, Ur Screen NONE DETECTED NONE DETECTED   Phencyclidine (PCP) Ur S NONE DETECTED NONE DETECTED   Cannabinoid 50 Ng, Ur [place] POSITIVE (A) NONE DETECTED   Barbiturates, Ur Screen NONE DETECTED NONE DETECTED   Benzodiazepine, Ur Scrn NONE DETECTED NONE DETECTED   Methadone Scn, Ur NONE DETECTED NONE DETECTED    Comment: (NOTE) Tricyclics + metabolites, urine    Cutoff 1000 ng/mL Amphetamines + metabolites, urine  Cutoff 1000 ng/mL MDMA (Ecstasy), urine              Cutoff 500 ng/mL Cocaine Metabolite, urine          Cutoff 300 ng/mL Opiate + metabolites, urine        Cutoff 300 ng/mL Phencyclidine (PCP), urine         Cutoff 25 ng/mL Cannabinoid, urine                 Cutoff 50 ng/mL Barbiturates + metabolites, urine  Cutoff 200 ng/mL Benzodiazepine, urine              Cutoff 200 ng/mL Methadone, urine                   Cutoff 300 ng/mL The urine drug screen provides only a preliminary, unconfirmed analytical test  result and should not be used for non-medical purposes. Clinical consideration and professional judgment should be applied to any positive drug screen result due to possible interfering substances. A more specific alternate chemical method must be used in order to obtain a confirmed analytical result. Gas chromatography / mass spectrometry (GC/MS) is the preferred confirmat ory method. Performed at [facility], [address] [place], [place] [address]   Pregnancy, urine     Status: None   Collection Time: 03/20/19 10:12 PM  Result Value Ref Range   Preg Test, Ur NEGATIVE NEGATIVE    Comment: Performed at [facility], [address] [place], [place] [address]    Current Facility-Administered Medications  Medication Dose Route Frequency Provider Last Rate Last Dose  . divalproex (DEPAKOTE) DR tablet 500 mg  500 mg Oral BID Mariel Craft, MD      . pantoprazole (PROTONIX) EC tablet 40 mg  40 mg Oral Daily Mariel Craft, MD      . traZODone (DESYREL) tablet 50 mg  50 mg Oral QHS Mariel Craft, MD       Current Outpatient Medications  Medication Sig Dispense Refill  . divalproex (  DEPAKOTE) 500 MG DR tablet Take 500 mg by mouth 2 (two) times daily.    . traZODone (DESYREL) 50 MG tablet Take 50 mg by mouth at bedtime.    . divalproex (DEPAKOTE ER) 500 MG 24 hr tablet Take 1 tablet (500 mg total) by mouth 2 (two) times daily. (Patient not taking: Reported on 03/21/2019) 60 tablet 2  . pantoprazole (PROTONIX) 40 MG tablet Take 1 tablet (40 mg total) by mouth daily for 30 days. (Patient not taking: Reported on 03/21/2019) 30 tablet 6  . traZODone (DESYREL) 150 MG tablet Take 1 tablet (150 mg total) by mouth at bedtime. (Patient not taking: Reported on 03/21/2019) 30 tablet 2    Musculoskeletal: Strength & Muscle Tone: within normal limits Gait & Station: normal Patient leans: N/A  Psychiatric Specialty Exam: Physical Exam  Nursing note and vitals  reviewed. Constitutional: She is oriented to person, place, and time. She appears well-developed and well-nourished. No distress.  HENT:  Head: Normocephalic and atraumatic.  Eyes: EOM are normal.  Neck: Normal range of motion.  Cardiovascular: Normal rate.  Respiratory: Effort normal. No respiratory distress.  Musculoskeletal: Normal range of motion.  Neurological: She is alert and oriented to person, place, and time.  Psychiatric: Her mood appears anxious. Her speech is delayed. She is slowed and withdrawn. Thought content is not paranoid. She expresses impulsivity. She exhibits a depressed mood. She expresses no homicidal and no suicidal ideation. She exhibits abnormal recent memory.    Review of Systems  Constitutional: Negative.   HENT: Negative.   Respiratory: Negative.   Cardiovascular: Negative.   Gastrointestinal: Negative.   Musculoskeletal: Negative.   Neurological: Negative.   Psychiatric/Behavioral: Positive for substance abuse. Negative for depression, hallucinations, memory loss and suicidal ideas. The patient is not nervous/anxious and does not have insomnia.     Height  (1.651 m), weight 77.1 kg.Body mass index is 28.29 kg/m.  General Appearance: Casual  Eye Contact:  Fair  Speech:  Normal Rate  Volume:  Decreased  Mood:  Euthymic  Affect:  Congruent  Thought Process:  Goal Directed  Orientation:  Full (Time, Place, and Person)  Thought Content:  Logical  Suicidal Thoughts:  No  Homicidal Thoughts:  No  Memory:  Immediate;   Fair Recent;   Fair Remote;   Fair  Judgement:  Fair  Insight:  Fair  Psychomotor Activity:  Decreased  Concentration:  Concentration: Fair  Recall:  [employer] of Knowledge:  Fair  Language:  Fair  Akathisia:  No  Handed:  Right  AIMS (if indicated):     Assets:  Desire for Improvement Housing Physical Health Resilience Social Support  ADL's:  Intact  Cognition:  WNL  Sleep:   Adequate     Treatment Plan  Summary: Plan Recent involuntary commitment  43 year old woman with chronic alcohol abuse and mood disorder.  Now that she has sobered up she is no longer suicidal.  No sign of psychosis.  Patient is able to articulate an understanding of her condition and the importance of getting outpatient care.  She does not meet criteria for commitment and does not require inpatient treatment.  Patient has current prescriptions of her Depakote and trazodone, and she will follow-up with Dr. Marguerite Olea at [facility].  Counseling completed about substance abuse treatment in the community, which will be encouraged through her peer support specialist.   Disposition: No evidence of imminent risk to self or others at present.   Patient does not meet criteria  for psychiatric inpatient admission. Discussed crisis plan, support from social network, calling 911, coming to the Emergency Department, and calling Suicide Hotline.   She was able to engage in safety planning including plan to return to nearest emergency room or contact emergency services if she feels unable to maintain her own safety or the safety of others. Patient had no further questions, comments, or concerns.  Discharge into care of peer support specialist, who agrees to maintain patient safety.   Mariel Craft, MD 03/21/2019 10:16 AM

## 2019-03-21 NOTE — ED Notes (Signed)
Patient observed lying in bed with eyes closed  Even, unlabored respirations observed   NAD pt appears to be sleeping  I will continue to monitor along with every 15 minute visual observations and ongoing security monitoring    

## 2019-03-21 NOTE — ED Provider Notes (Signed)
Cleared for D/c by Dr. Russella Dar, MD 03/21/19 [phone]

## 2019-03-21 NOTE — ED Notes (Signed)
Pt asleep, breakfast tray placed in rm.  

## 2019-03-23 ENCOUNTER — Other Ambulatory Visit: Payer: Self-pay

## 2019-03-23 DIAGNOSIS — K2100 Gastro-esophageal reflux disease with esophagitis, without bleeding: Secondary | ICD-10-CM

## 2019-03-23 MED ORDER — PANTOPRAZOLE SODIUM 40 MG PO TBEC: 40.0000 mg | DELAYED_RELEASE_TABLET | Freq: Every day | ORAL | 6 refills | Status: DC

## 2019-03-30 NOTE — Progress Notes (Signed)
Established Patient Office Visit  Subjective:  Patient ID: Kara Lawrence, female    DOB: Apr 18, 1976  Age: 43 y.o. MRN: [id number]  CC: No chief complaint on file.   HPI Kara Lawrence presents for follow up for Suicidal ideation and lab review.  Patient consents to telephone visit and 2 patient identifier was used to identify patient.  She was seen at the ED on 03/20/19 for suicidal ideation. She has a history of bipolar depression, and reports that her mood is good. She denies sucidal or homicidal ideation and follows up with Dr Ernie Hew at [facility]. She reports that she had a seizure activity prior to going to the hospital on 03/20/19. She states that her eyes rolled backwards and she passed out. She takes 500 mg Depakote daily and her valproic acid level done on 03/21/19 was <10 ug /ml. She reports that her acid reflux is well controlled with taking 40 mg Protonix daily. Her AST was 235 and ALT was 141 on 03/20/19, and she reports drinking more than 3-4 bottles of beer daily. She denies any withdrawal symptoms, fever, chills, chest pain, palpitations and no further concerns.     Past Medical History:  Diagnosis Date  . Allergy   . Bipolar 1 disorder ([place])   . Depression   . Manic depression ([place])   . Seizure [facility])     Past Surgical History:  Procedure Laterality Date  . TUBAL LIGATION Bilateral     History reviewed. No pertinent family history.  Social History   Socioeconomic History  . Marital status: Single    Spouse name: Not on file  . Number of children: Not on file  . Years of education: Not on file  . Highest education level: Not on file  Occupational History  . Not on file  Social Needs  . Financial resource strain: Not on file  . Food insecurity:    Worry: Not on file    Inability: Not on file  . Transportation needs:    Medical: Not on file    Non-medical: Not on file  Tobacco Use  . Smoking status: Never Smoker  . Smokeless tobacco: Never  Used  Substance and Sexual Activity  . Alcohol use: Yes    Comment: drinks a couple of beers a week  . Drug use: Not Currently    Types: "Crack" cocaine, Benzodiazepines  . Sexual activity: Yes    Birth control/protection: None  Lifestyle  . Physical activity:    Days per week: Not on file    Minutes per session: Not on file  . Stress: Not on file  Relationships  . Social connections:    Talks on phone: Not on file    Gets together: Not on file    Attends religious service: Not on file    Active member of club or organization: Not on file    Attends meetings of clubs or organizations: Not on file    Relationship status: Not on file  . Intimate partner violence:    Fear of current or ex partner: Not on file    Emotionally abused: Not on file    Physically abused: Not on file    Forced sexual activity: Not on file  Other Topics Concern  . Not on file  Social History Narrative  . Not on file    Outpatient Medications Prior to Visit  Medication Sig Dispense Refill  . divalproex (DEPAKOTE) 500 MG DR tablet Take 500 mg by mouth 2 (  two) times daily.    . pantoprazole (PROTONIX) 40 MG tablet Take 1 tablet (40 mg total) by mouth daily for 30 days. 30 tablet 6  . traZODone (DESYREL) 50 MG tablet Take 50 mg by mouth at bedtime.    . divalproex (DEPAKOTE ER) 500 MG 24 hr tablet Take 1 tablet (500 mg total) by mouth 2 (two) times daily. (Patient not taking: Reported on 03/21/2019) 60 tablet 2  . traZODone (DESYREL) 150 MG tablet Take 1 tablet (150 mg total) by mouth at bedtime. (Patient not taking: Reported on 03/21/2019) 30 tablet 2   No facility-administered medications prior to visit.     No Known Allergies  ROS Review of Systems  Constitutional: Negative.   HENT: Negative.   Respiratory: Negative.   Cardiovascular: Negative.   Gastrointestinal: Negative.   Genitourinary: Negative.   Musculoskeletal: Negative.   Skin: Negative.   Neurological: Negative.    Psychiatric/Behavioral: Negative.       Objective:    Physical Exam   No vital signs and PE done.  There were no vitals taken for this visit. Wt Readings from Last 3 Encounters:  03/20/19 170 lb (77.1 kg)  02/22/19 142 lb 11.2 oz (64.7 kg)  08/22/18 140 lb (63.5 kg)     Health Maintenance Due  Topic Date Due  . HIV Screening  06/26/1989  . TETANUS/TDAP  06/26/1993  . PAP SMEAR-Modifier  06/27/1995    There are no preventive care reminders to display for this patient.  Lab Results  Component Value Date   TSH 3.190 02/23/2019   Lab Results  Component Value Date   WBC 5.5 03/20/2019   HGB 9.2 (L) 03/20/2019   HCT 29.7 (L) 03/20/2019   MCV 64.3 (L) 03/20/2019   PLT 495 (H) 03/20/2019   Lab Results  Component Value Date   NA 139 03/20/2019   K 3.7 03/20/2019   CO2 24 03/20/2019   GLUCOSE 94 03/20/2019   BUN 8 03/20/2019   CREATININE 0.82 03/20/2019   BILITOT 0.5 03/20/2019   ALKPHOS 81 03/20/2019   AST 235 (H) 03/20/2019   ALT 141 (H) 03/20/2019   PROT 8.3 (H) 03/20/2019   ALBUMIN 3.8 03/20/2019   CALCIUM 8.6 (L) 03/20/2019   ANIONGAP 10 03/20/2019   Lab Results  Component Value Date   CHOL 158 02/23/2019   Lab Results  Component Value Date   HDL 97 02/23/2019   Lab Results  Component Value Date   LDLCALC 51 02/23/2019   Lab Results  Component Value Date   TRIG 51 02/23/2019   Lab Results  Component Value Date   CHOLHDL 1.6 02/23/2019   Lab Results  Component Value Date   HGBA1C 4.9 02/23/2019      Assessment & Plan:     1. History of bipolar disorder - She will continue to follow up with Dr Ernie Hew at [facility] -Please continue on current medication, for worsening depressive symptoms please call [facility]. -For suicidal or homicidal ideation please call 911 and or call suicide prevention line 2. History of seizure -She was advised to complete charity care application for neurology referral. - Valproic Acid level; Future - Ambulatory  referral to Neurology; Future  3. Elevated liver enzymes -She was advised on alcohol cessation - Comp Met (CMET); Future  4. Gastroesophageal reflux disease with esophagitis -She will continue on current treatment regimen  5. Health care maintenance -Routine labs to be done to evaluate for anemia. - Comp Met (CMET); Future - CBC w/Diff;  Future - Iron; Future - Iron Binding Cap (TIBC)([facility]); Future - Ferritin, Serum (Serial); Future - Urine drugs of abuse scrn w alc, routine ([facility] [place] [facility]); Future - Lipid panel; Future   Follow-up: Return in about 5 weeks (around 05/05/2019), or if symptoms worsen or fail to improve.    Ambrose Wile Jerold Coombe, NP

## 2019-03-31 ENCOUNTER — Other Ambulatory Visit: Payer: Self-pay

## 2019-03-31 ENCOUNTER — Encounter: Payer: Self-pay | Admitting: Gerontology

## 2019-03-31 ENCOUNTER — Ambulatory Visit: Payer: Self-pay | Admitting: Gerontology

## 2019-03-31 DIAGNOSIS — Z87898 Personal history of other specified conditions: Secondary | ICD-10-CM

## 2019-03-31 DIAGNOSIS — K2100 Gastro-esophageal reflux disease with esophagitis, without bleeding: Secondary | ICD-10-CM

## 2019-03-31 DIAGNOSIS — Z8659 Personal history of other mental and behavioral disorders: Secondary | ICD-10-CM

## 2019-03-31 DIAGNOSIS — Z0000 Encounter for general adult medical examination without abnormal findings: Secondary | ICD-10-CM

## 2019-03-31 DIAGNOSIS — R748 Abnormal levels of other serum enzymes: Secondary | ICD-10-CM

## 2019-05-04 ENCOUNTER — Other Ambulatory Visit: Payer: Self-pay

## 2019-05-05 ENCOUNTER — Telehealth: Payer: Self-pay | Admitting: *Deleted

## 2019-05-05 ENCOUNTER — Ambulatory Visit: Payer: Self-pay | Admitting: Gerontology

## 2019-05-05 ENCOUNTER — Encounter: Payer: Self-pay | Admitting: *Deleted

## 2019-05-05 NOTE — Telephone Encounter (Signed)
Called number listed for patient, and Laurey Arrow answered. She is the patient's peer support with [facility] [place]. I advised Shanda Bumps that I was calling to update EMR. Shanda Bumps was able to answer most questions, and she will call patient to clarify strength, frequency of depakote and any pertinent family history. She will call back.

## 2019-05-10 ENCOUNTER — Ambulatory Visit (INDEPENDENT_AMBULATORY_CARE_PROVIDER_SITE_OTHER): Payer: Self-pay | Admitting: Diagnostic Neuroimaging

## 2019-05-10 ENCOUNTER — Other Ambulatory Visit: Payer: Self-pay

## 2019-05-10 ENCOUNTER — Encounter: Payer: Self-pay | Admitting: Diagnostic Neuroimaging

## 2019-05-10 DIAGNOSIS — F1910 Other psychoactive substance abuse, uncomplicated: Secondary | ICD-10-CM

## 2019-05-10 DIAGNOSIS — F1020 Alcohol dependence, uncomplicated: Secondary | ICD-10-CM

## 2019-05-10 DIAGNOSIS — R569 Unspecified convulsions: Secondary | ICD-10-CM

## 2019-05-10 NOTE — Progress Notes (Signed)
[facility]  PATIENT: Kara Lawrence DOB: May 17, 1976  REFERRING CLINICIAN: [place] HISTORY FROM: patient  REASON FOR VISIT: new consult    HISTORICAL  CHIEF COMPLAINT:  Chief Complaint  Patient presents with  . Seizures    HISTORY OF PRESENT ILLNESS:   43 year old female here for evaluation of seizures.  Patient had first seizure around 43 years old.  She felt an abnormal sensation, eyes rolled back and she had shaking.  No tongue biting or incontinence.  She had confusion afterwards.  She never saw a neurologist in the past for this problem.  All of her seizures have been associated with alcohol intoxication and substance abuse.  At some point she was started on Depakote for mood stabilization and for seizure control.  Her last episode of possible seizure was in April 2020 when she was admitted to the hospital for "suicidal ideation" and altered mental status.  Patient had alcohol intoxication and cocaine in her system.  Patient is continued on antiseizure medication Depakote 500 mg twice a day, prescribed by her psychiatrist.   REVIEW OF SYSTEMS: Full 14 system review of systems performed and negative with exception of: As per HPI.  ALLERGIES: No Known Allergies  HOME MEDICATIONS: Outpatient Medications Prior to Visit  Medication Sig Dispense Refill  . divalproex (DEPAKOTE) 500 MG DR tablet Take 500 mg by mouth 2 (two) times daily.    . pantoprazole (PROTONIX) 40 MG tablet Take 1 tablet (40 mg total) by mouth daily for 30 days. 30 tablet 6  . traZODone (DESYREL) 50 MG tablet Take 50 mg by mouth at bedtime.     No facility-administered medications prior to visit.     PAST MEDICAL HISTORY: Past Medical History:  Diagnosis Date  . Allergy   . Bipolar 1 disorder (HCC)   . Depression   . Manic depression (HCC)    Dr Suzie Portela, psychiatrist  . Seizure [facility])     PAST SURGICAL HISTORY: Past Surgical History:  Procedure Laterality Date   . TUBAL LIGATION Bilateral     FAMILY HISTORY: No family history on file.  SOCIAL HISTORY: Social History   Socioeconomic History  . Marital status: Single    Spouse name: Not on file  . Number of children: 5  . Years of education: 8  . Highest education level: 8th grade  Occupational History  . Not on file  Social Needs  . Financial resource strain: Not on file  . Food insecurity:    Worry: Not on file    Inability: Not on file  . Transportation needs:    Medical: Not on file    Non-medical: Not on file  Tobacco Use  . Smoking status: Never Smoker  . Smokeless tobacco: Never Used  Substance and Sexual Activity  . Alcohol use: Yes    Comment: drinks a couple of beers a week  . Drug use: Not Currently    Types: "Crack" cocaine, Benzodiazepines  . Sexual activity: Yes    Birth control/protection: None  Lifestyle  . Physical activity:    Days per week: Not on file    Minutes per session: Not on file  . Stress: Not on file  Relationships  . Social connections:    Talks on phone: Not on file    Gets together: Not on file    Attends religious service: Not on file    Active member of club or organization: Not on file    Attends meetings of clubs or organizations:  Not on file    Relationship status: Not on file  . Intimate partner violence:    Fear of current or ex partner: Not on file    Emotionally abused: Not on file    Physically abused: Not on file    Forced sexual activity: Not on file  Other Topics Concern  . Not on file  Social History Narrative   Lives in boarding room with boyfriend     PHYSICAL EXAM   VIDEO EXAM  GENERAL EXAM/CONSTITUTIONAL:  Vitals: There were no vitals filed for this visit.  There is no height or weight on file to calculate BMI. Wt Readings from Last 3 Encounters:  03/20/19 170 lb (77.1 kg)  02/22/19 142 lb 11.2 oz (64.7 kg)  08/22/18 140 lb (63.5 kg)     Patient is in no distress; well developed, nourished and  groomed; neck is supple   NEUROLOGIC: MENTAL STATUS:  No flowsheet data found.  awake, alert, oriented to person, place and time  recent and remote memory intact  normal attention and concentration  language fluent, comprehension intact, naming intact  fund of knowledge appropriate  CRANIAL NERVE:   2nd, 3rd, 4th, 6th - visual fields full to confrontation, extraocular muscles intact, no nystagmus  5th - facial sensation symmetric  7th - facial strength symmetric  8th - hearing intact  11th - shoulder shrug symmetric  12th - tongue protrusion midline  MOTOR:   NO TREMOR; NO DRIFT IN BUE  SENSORY:   normal and symmetric to light touch  COORDINATION:   fine finger movements normal     DIAGNOSTIC DATA (LABS, IMAGING, TESTING) - I reviewed patient records, labs, notes, testing and imaging myself where available.  Lab Results  Component Value Date   WBC 5.5 03/20/2019   HGB 9.2 (L) 03/20/2019   HCT 29.7 (L) 03/20/2019   MCV 64.3 (L) 03/20/2019   PLT 495 (H) 03/20/2019      Component Value Date/Time   NA 139 03/20/2019 2212   NA 138 02/23/2019 1125   NA 140 12/28/2014 0134   K 3.7 03/20/2019 2212   K 4.0 12/28/2014 0134   CL 105 03/20/2019 2212   CL 105 12/28/2014 0134   CO2 24 03/20/2019 2212   CO2 29 12/28/2014 0134   GLUCOSE 94 03/20/2019 2212   GLUCOSE 86 12/28/2014 0134   BUN 8 03/20/2019 2212   BUN 9 02/23/2019 1125   BUN 6 (L) 12/28/2014 0134   CREATININE 0.82 03/20/2019 2212   CREATININE 0.84 12/28/2014 0134   CALCIUM 8.6 (L) 03/20/2019 2212   CALCIUM 8.5 12/28/2014 0134   PROT 8.3 (H) 03/20/2019 2212   PROT 8.0 02/23/2019 1125   PROT 8.5 (H) 12/28/2014 0134   ALBUMIN 3.8 03/20/2019 2212   ALBUMIN 4.1 02/23/2019 1125   ALBUMIN 3.6 12/28/2014 0134   AST 235 (H) 03/20/2019 2212   AST 75 (H) 12/28/2014 0134   ALT 141 (H) 03/20/2019 2212   ALT 49 12/28/2014 0134   ALKPHOS 81 03/20/2019 2212   ALKPHOS 85 12/28/2014 0134   BILITOT  0.5 03/20/2019 2212   BILITOT 0.3 02/23/2019 1125   BILITOT 0.2 12/28/2014 0134   GFRNONAA >60 03/20/2019 2212   GFRNONAA >60 12/28/2014 0134   GFRNONAA >60 05/15/2013 2252   GFRAA >60 03/20/2019 2212   GFRAA >60 12/28/2014 0134   GFRAA >60 05/15/2013 2252   Lab Results  Component Value Date   CHOL 158 02/23/2019   HDL 97 02/23/2019  LDLCALC 51 02/23/2019   TRIG 51 02/23/2019   CHOLHDL 1.6 02/23/2019   Lab Results  Component Value Date   HGBA1C 4.9 02/23/2019   No results found for: VITAMINB12 Lab Results  Component Value Date   TSH 3.190 02/23/2019    08/05/17 CT head [I reviewed images myself and agree with interpretation. -[facility]P]  1. No intracranial trauma. 2. Soft tissue swelling in the preseptal space anterior to the LEFT globe. Potential small foci of foreign body within the soft tissue thickening. No proptosis. Intraconal contents are normal. 3. No evidence of facial bone fracture. 4. No cervical spine fracture   ASSESSMENT AND PLAN  43 y.o. year old female here with:  Dx:  1. Seizures (HCC)   2. Alcohol use disorder, severe, dependence (HCC)   3. Polysubstance abuse [facility])     Virtual Visit via Video Note  I connected with Kara Lawrence on 05/10/19 at  2:00 PM EDT by a video enabled telemedicine application and verified that I am speaking with the correct person using two identifiers.  Location: Patient: home / car Provider: office   I discussed the limitations of evaluation and management by telemedicine and the availability of in person appointments. The patient expressed understanding and agreed to proceed.  I discussed the assessment and treatment plan with the patient. The patient was provided an opportunity to ask questions and all were answered. The patient agreed with the plan and demonstrated an understanding of the instructions.   The patient was advised to call back or seek an in-person evaluation if the symptoms worsen or if the  condition fails to improve as anticipated.  I provided 25 minutes of non-face-to-face time during this encounter.   PLAN:  SEIZURE DISORDER (associated with alcohol intoxication and cocaine use) - counseled on substance abuse - continue divalproex 500mg  twice a day   Return for pending if symptoms worsen or fail to improve, return to PCP.    Suanne Marker, MD 05/10/2019, 1:59 PM Certified in Neurology, Neurophysiology and Neuroimaging  [facility] [address] [place], [place] [address] [phone]

## 2019-05-20 ENCOUNTER — Encounter: Payer: Self-pay | Admitting: Emergency Medicine

## 2019-05-20 ENCOUNTER — Other Ambulatory Visit: Payer: Self-pay

## 2019-05-20 DIAGNOSIS — F10921 Alcohol use, unspecified with intoxication delirium: Secondary | ICD-10-CM | POA: Insufficient documentation

## 2019-05-20 DIAGNOSIS — F1910 Other psychoactive substance abuse, uncomplicated: Secondary | ICD-10-CM | POA: Insufficient documentation

## 2019-05-20 DIAGNOSIS — F1410 Cocaine abuse, uncomplicated: Secondary | ICD-10-CM | POA: Insufficient documentation

## 2019-05-20 DIAGNOSIS — F319 Bipolar disorder, unspecified: Secondary | ICD-10-CM | POA: Insufficient documentation

## 2019-05-20 DIAGNOSIS — F1310 Sedative, hypnotic or anxiolytic abuse, uncomplicated: Secondary | ICD-10-CM | POA: Insufficient documentation

## 2019-05-20 DIAGNOSIS — F1210 Cannabis abuse, uncomplicated: Secondary | ICD-10-CM | POA: Insufficient documentation
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
## 2019-05-21 ENCOUNTER — Emergency Department
Admission: EM | Admit: 2019-05-21 | Discharge: 2019-05-21 | Disposition: A | Discharge status: Home / Self Care | Payer: Self-pay | Attending: Emergency Medicine | Admitting: Emergency Medicine

## 2019-05-21 DIAGNOSIS — F10921 Alcohol use, unspecified with intoxication delirium: Secondary | ICD-10-CM

## 2019-05-21 DIAGNOSIS — F1014 Alcohol abuse with alcohol-induced mood disorder: Secondary | ICD-10-CM | POA: Diagnosis present

## 2019-05-21 DIAGNOSIS — F1910 Other psychoactive substance abuse, uncomplicated: Secondary | ICD-10-CM

## 2019-05-21 LAB — CBC
HCT: 25.4 % — ABNORMAL LOW (ref 36.0–46.0)
Hemoglobin: 7.5 g/dL — ABNORMAL LOW (ref 12.0–15.0)
MCH: 17.9 pg — ABNORMAL LOW (ref 26.0–34.0)
MCHC: 29.5 g/dL — ABNORMAL LOW (ref 30.0–36.0)
MCV: 60.8 fL — ABNORMAL LOW (ref 80.0–100.0)
Platelets: 559 10*3/uL — ABNORMAL HIGH (ref 150–400)
RBC: 4.18 MIL/uL (ref 3.87–5.11)
RDW: 18.6 % — ABNORMAL HIGH (ref 11.5–15.5)
WBC: 5.9 10*3/uL (ref 4.0–10.5)
nRBC: 0 % (ref 0.0–0.2)

## 2019-05-21 LAB — URINE DRUG SCREEN, QUALITATIVE (ARMC ONLY)
Amphetamines, Ur Screen: NOT DETECTED
Barbiturates, Ur Screen: NOT DETECTED
Benzodiazepine, Ur Scrn: NOT DETECTED
Cannabinoid 50 Ng, Ur ~~LOC~~: POSITIVE — AB
Cocaine Metabolite,Ur ~~LOC~~: NOT DETECTED
MDMA (Ecstasy)Ur Screen: NOT DETECTED
Methadone Scn, Ur: NOT DETECTED
Opiate, Ur Screen: NOT DETECTED
Phencyclidine (PCP) Ur S: NOT DETECTED
Tricyclic, Ur Screen: NOT DETECTED

## 2019-05-21 LAB — COMPREHENSIVE METABOLIC PANEL
ALT: 34 U/L (ref 0–44)
AST: 90 U/L — ABNORMAL HIGH (ref 15–41)
Albumin: 3.7 g/dL (ref 3.5–5.0)
Alkaline Phosphatase: 74 U/L (ref 38–126)
Anion gap: 12 (ref 5–15)
BUN: 10 mg/dL (ref 6–20)
CO2: 20 mmol/L — ABNORMAL LOW (ref 22–32)
Calcium: 8.5 mg/dL — ABNORMAL LOW (ref 8.9–10.3)
Chloride: 106 mmol/L (ref 98–111)
Creatinine, Ser: 0.74 mg/dL (ref 0.44–1.00)
GFR calc Af Amer: >60 mL/min (ref >60)
GFR calc non Af Amer: >60 mL/min (ref >60)
Glucose, Bld: 104 mg/dL — ABNORMAL HIGH (ref 70–99)
Potassium: 3.5 mmol/L (ref 3.5–5.1)
Sodium: 138 mmol/L (ref 135–145)
Total Bilirubin: 0.5 mg/dL (ref 0.3–1.2)
Total Protein: 8.8 g/dL — ABNORMAL HIGH (ref 6.5–8.1)

## 2019-05-21 LAB — ETHANOL: Alcohol, Ethyl (B): 339 mg/dL (ref <10)

## 2019-05-21 LAB — POCT PREGNANCY, URINE: Preg Test, Ur: NEGATIVE

## 2019-05-21 MED ORDER — LORAZEPAM 2 MG/ML IJ SOLN
2.0000 mg | Freq: Once | INTRAMUSCULAR | Status: AC
  Administered 2019-05-21: 2 mg via INTRAMUSCULAR

## 2019-05-21 MED ORDER — DIVALPROEX SODIUM 500 MG PO DR TAB
500.0000 mg | DELAYED_RELEASE_TABLET | Freq: Two times a day (BID) | ORAL | Status: DC
  Administered 2019-05-21: 500 mg via ORAL
  Filled 2019-05-21: qty 1

## 2019-05-21 MED ORDER — HALOPERIDOL LACTATE 5 MG/ML IJ SOLN
5.0000 mg | Freq: Once | INTRAMUSCULAR | Status: AC
  Administered 2019-05-21: 5 mg via INTRAMUSCULAR

## 2019-05-21 MED ORDER — FERROUS SULFATE 325 (65 FE) MG PO TABS: 325.0000 mg | ORAL_TABLET | Freq: Every day | ORAL | Status: DC

## 2019-05-21 MED ORDER — TRAZODONE HCL 50 MG PO TABS: 50.0000 mg | ORAL_TABLET | Freq: Every day | ORAL | Status: DC

## 2019-05-21 MED ORDER — PANTOPRAZOLE SODIUM 40 MG PO TBEC
40.0000 mg | DELAYED_RELEASE_TABLET | Freq: Every day | ORAL | Status: DC
  Administered 2019-05-21: 40 mg via ORAL
  Filled 2019-05-21: qty 1

## 2019-05-21 MED ORDER — DIPHENHYDRAMINE HCL 50 MG/ML IJ SOLN: 50.0000 mg | Freq: Once | INTRAMUSCULAR | Status: DC

## 2019-05-21 MED ORDER — LORAZEPAM 2 MG/ML IJ SOLN
INTRAMUSCULAR | Status: AC
  Filled 2019-05-21: qty 1

## 2019-05-21 MED ORDER — DIPHENHYDRAMINE HCL 50 MG/ML IJ SOLN
50.0000 mg | Freq: Once | INTRAMUSCULAR | Status: AC
  Administered 2019-05-21: 50 mg via INTRAMUSCULAR

## 2019-05-21 MED ORDER — FERROUS SULFATE 325 (65 FE) MG PO TABS
325.0000 mg | ORAL_TABLET | ORAL | Status: AC
  Administered 2019-05-21: 325 mg via ORAL
  Filled 2019-05-21: qty 1

## 2019-05-21 MED ORDER — FERROUS SULFATE 325 (65 FE) MG PO TABS: 325.0000 mg | ORAL_TABLET | Freq: Every day | ORAL | 3 refills | Status: DC

## 2019-05-21 MED ORDER — DIPHENHYDRAMINE HCL 50 MG/ML IJ SOLN
INTRAMUSCULAR | Status: AC
  Filled 2019-05-21: qty 1

## 2019-05-21 MED ORDER — LORAZEPAM 1 MG PO TABS: 1.0000 mg | ORAL_TABLET | ORAL | Status: DC | PRN

## 2019-05-21 MED ORDER — HALOPERIDOL LACTATE 5 MG/ML IJ SOLN
INTRAMUSCULAR | Status: AC
  Filled 2019-05-21: qty 1
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
## 2019-05-21 NOTE — ED Provider Notes (Signed)
Patient's H&H is significantly lower than usual.  She says she has been having very heavy periods.  [facility] get her to follow-up with OB/GYN.  I told her to make sure that that indeed happens.  In the meantime we will start her on some iron.  I warned her about it causing constipation and black stools but hopefully at the lower dose will start her on this will not happen.   Nena Polio, MD 05/21/19 [phone]

## 2019-05-21 NOTE — ED Notes (Signed)
Report to jane, rn.  
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
## 2019-05-21 NOTE — ED Notes (Signed)
Patient just woke up and is eating lunch, she asked for a ginger-ale and was able to take her medications with no issues
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
## 2019-05-21 NOTE — ED Notes (Signed)
Pt threw food on floor, states "I don't want that shit".

## 2019-05-21 NOTE — ED Notes (Signed)
MD in to see pt.

## 2019-05-21 NOTE — ED Notes (Signed)
Critical alcohol of 339 called from lab. Dr. Owens Shark notified, no new verbal orders received.

## 2019-05-21 NOTE — ED Notes (Signed)
Pt provided with po fluids. Pt has removed pants and refuses to replace pants on body. Pt states she wants more food, but then when offered additional choices she states she does not want those and will throw them at staff if given.

## 2019-05-21 NOTE — Discharge Instructions (Signed)
Alcohol Use Disorder °Alcohol use disorder is when your drinking disrupts your daily life. When you have this condition, you drink too much alcohol and you cannot control your drinking. °Alcohol use disorder can cause serious problems with your physical health. It can affect your brain, heart, liver, pancreas, immune system, stomach, and intestines. Alcohol use disorder can increase your risk for certain cancers and cause problems with your mental health, such as depression, anxiety, psychosis, delirium, and dementia. People with this disorder risk hurting themselves and others. °What are the causes? °This condition is caused by drinking too much alcohol over time. It is not caused by drinking too much alcohol only one or two times. Some people with this condition drink alcohol to cope with or escape from negative life events. Others drink to relieve pain or symptoms of mental illness. °What increases the risk? °You are more likely to develop this condition if: °· You have a family history of alcohol use disorder. °· Your culture encourages drinking to the point of intoxication, or makes alcohol easy to get. °· You had a mood or conduct disorder in childhood. °· You have been a victim of abuse. °· You are an adolescent and: °? You have poor grades or difficulties in school. °? Your caregivers do not talk to you about saying no to alcohol, or supervise your activities. °? You are impulsive or you have trouble with self-control. °What are the signs or symptoms? °Symptoms of this condition include: °· Drinking more than you want to. °· Drinking for longer than you want to. °· Trying several times to drink less or to control your drinking. °· Spending a lot of time getting alcohol, drinking, or recovering from drinking. °· Craving alcohol. °· Having problems at work, at school, or at home due to drinking. °· Having problems in relationships due to drinking. °· Drinking when it is dangerous to drink, such as before  driving a car. °· Continuing to drink even though you know you might have a physical or mental problem related to drinking. °· Needing more and more alcohol to get the same effect you want from the alcohol (building up tolerance). °· Having symptoms of withdrawal when you stop drinking. Symptoms of withdrawal include: °? Fatigue. °? Nightmares. °? Trouble sleeping. °? Depression. °? Anxiety. °? Fever. °? Seizures. °? Severe confusion. °? Feeling or seeing things that are not there (hallucinations). °? Tremors. °? Rapid heart rate. °? Rapid breathing. °? High blood pressure. °· Drinking to avoid symptoms of withdrawal. °How is this diagnosed? °This condition is diagnosed with an assessment. Your health care provider may start the assessment by asking three or four questions about your drinking. °Your health care provider may perform a physical exam or do lab tests to see if you have physical problems resulting from alcohol use. She or he may refer you to a mental health professional for evaluation. °How is this treated? °Some people with alcohol use disorder are able to reduce their alcohol use to low-risk levels. Others need to completely quit drinking alcohol. When necessary, mental health professionals with specialized training in substance use treatment can help. Your health care provider can help you decide how severe your alcohol use disorder is and what type of treatment you need. The following forms of treatment are available: °· Detoxification. Detoxification involves quitting drinking and using prescription medicines within the first week to help lessen withdrawal symptoms. This treatment is important for people who have had withdrawal symptoms before and for heavy drinkers   who are likely to have withdrawal symptoms. Alcohol withdrawal can be dangerous, and in severe cases, it can cause death. Detoxification may be provided in a home, community, or primary care setting, or in a hospital or substance use  treatment facility. °· Counseling. This treatment is also called talk therapy. It is provided by substance use treatment counselors. A counselor can address the reasons you use alcohol and suggest ways to keep you from drinking again or to prevent problem drinking. The goals of talk therapy are to: °? Find healthy activities and ways for you to cope with stress. °? Identify and avoid the things that trigger your alcohol use. °? Help you learn how to handle cravings. °· Medicines. Medicines can help treat alcohol use disorder by: °? Decreasing alcohol cravings. °? Decreasing the positive feeling you have when you drink alcohol. °? Causing an uncomfortable physical reaction when you drink alcohol (aversion therapy). °· Support groups. Support groups are led by people who have quit drinking. They provide emotional support, advice, and guidance. °These forms of treatment are often combined. Some people with this condition benefit from a combination of treatments provided by specialized substance use treatment centers. °Follow these instructions at home: °· Take over-the-counter and prescription medicines only as told by your health care provider. °· Check with your health care provider before starting any new medicines. °· Ask friends and family members not to offer you alcohol. °· Avoid situations where alcohol is served, including gatherings where others are drinking alcohol. °· Create a plan for what to do when you are tempted to use alcohol. °· Find hobbies or activities that you enjoy that do not include alcohol. °· Keep all follow-up visits as told by your health care provider. This is important. °How is this prevented? °· If you drink, limit alcohol intake to no more than 1 drink a day for nonpregnant women and 2 drinks a day for men. One drink equals 12 oz of beer, 5 oz of wine, or 1½ oz of hard liquor. °· If you have a mental health condition, get treatment and support. °· Do not give alcohol to  adolescents. °· If you are an adolescent: °? Do not drink alcohol. °? Do not be afraid to say no if someone offers you alcohol. Speak up about why you do not want to drink. You can be a positive role model for your friends and set a good example for those around you by not drinking alcohol. °? If your friends drink, spend time with others who do not drink alcohol. Make new friends who do not use alcohol. °? Find healthy ways to manage stress and emotions, such as meditation or deep breathing, exercise, spending time in nature, listening to music, or talking with a trusted friend or family member. °Contact a health care provider if: °· You are not able to take your medicines as told. °· Your symptoms get worse. °· You return to drinking alcohol (relapse) and your symptoms get worse. °Get help right away if: °· You have thoughts about hurting yourself or others. °If you ever feel like you may hurt yourself or others, or have thoughts about taking your own life, get help right away. You can go to your nearest emergency department or call: °· Your local emergency services (911 in the [place] °· A suicide crisis helpline, such as the National Suicide Prevention Lifeline at 1-800-273-8255. This is open 24 hours a day. °Summary °· Alcohol use disorder is when your drinking disrupts your daily   life. When you have this condition, you drink too much alcohol and you cannot control your drinking.  Treatment may include detoxification, counseling, medicine, and support groups.  Ask friends and family members not to offer you alcohol. Avoid situations where alcohol is served.  Get help right away if you have thoughts about hurting yourself or others. This information is not intended to replace advice given to you by your health care provider. Make sure you discuss any questions you have with your health care provider. Document Released: 01/08/2005 Document Revised: 08/28/2016 Document Reviewed: 08/28/2016 Elsevier  Interactive Patient Education  2019 [employer]   Alcohol Use Disorder Alcohol use disorder is when your drinking disrupts your daily life. When you have this condition, you drink too much alcohol and you cannot control your drinking. Alcohol use disorder can cause serious problems with your physical health. It can affect your brain, heart, liver, pancreas, immune system, stomach, and intestines. Alcohol use disorder can increase your risk for certain cancers and cause problems with your mental health, such as depression, anxiety, psychosis, delirium, and dementia. People with this disorder risk hurting themselves and others. What are the causes? This condition is caused by drinking too much alcohol over time. It is not caused by drinking too much alcohol only one or two times. Some people with this condition drink alcohol to cope with or escape from negative life events. Others drink to relieve pain or symptoms of mental illness. What increases the risk? You are more likely to develop this condition if:  You have a family history of alcohol use disorder.  Your culture encourages drinking to the point of intoxication, or makes alcohol easy to get.  You had a mood or conduct disorder in childhood.  You have been a victim of abuse.  You are an adolescent and: ? You have poor grades or difficulties in school. ? Your caregivers do not talk to you about saying no to alcohol, or supervise your activities. ? You are impulsive or you have trouble with self-control. What are the signs or symptoms? Symptoms of this condition include:  Drinkingmore than you want to.  Drinking for longer than you want to.  Trying several times to drink less or to control your drinking.  Spending a lot of time getting alcohol, drinking, or recovering from drinking.  Craving alcohol.  Having problems at work, at school, or at home due to drinking.  Having problems in relationships due to  drinking.  Drinking when it is dangerous to drink, such as before driving a car.  Continuing to drink even though you know you might have a physical or mental problem related to drinking.  Needing more and more alcohol to get the same effect you want from the alcohol (building up tolerance).  Having symptoms of withdrawal when you stop drinking. Symptoms of withdrawal include: ? Fatigue. ? Nightmares. ? Trouble sleeping. ? Depression. ? Anxiety. ? Fever. ? Seizures. ? Severe confusion. ? Feeling or seeing things that are not there (hallucinations). ? Tremors. ? Rapid heart rate. ? Rapid breathing. ? High blood pressure.  Drinking to avoid symptoms of withdrawal. How is this diagnosed? This condition is diagnosed with an assessment. Your health care provider may start the assessment by asking three or four questions about your drinking. Your health care provider may perform a physical exam or do lab tests to see if you have physical problems resulting from alcohol use. She or he may refer you to a mental health professional  for evaluation. How is this treated? Some people with alcohol use disorder are able to reduce their alcohol use to low-risk levels. Others need to completely quit drinking alcohol. When necessary, mental health professionals with specialized training in substance use treatment can help. Your health care provider can help you decide how severe your alcohol use disorder is and what type of treatment you need. The following forms of treatment are available:  Detoxification. Detoxification involves quitting drinking and using prescription medicines within the first week to help lessen withdrawal symptoms. This treatment is important for people who have had withdrawal symptoms before and for heavy drinkers who are likely to have withdrawal symptoms. Alcohol withdrawal can be dangerous, and in severe cases, it can cause death. Detoxification may be provided in a home,  community, or primary care setting, or in a hospital or substance use treatment facility.  Counseling. This treatment is also called talk therapy. It is provided by substance use treatment counselors. A counselor can address the reasons you use alcohol and suggest ways to keep you from drinking again or to prevent problem drinking. The goals of talk therapy are to: ? Find healthy activities and ways for you to cope with stress. ? Identify and avoid the things that trigger your alcohol use. ? Help you learn how to handle cravings.  Medicines.Medicines can help treat alcohol use disorder by: ? Decreasing alcohol cravings. ? Decreasing the positive feeling you have when you drink alcohol. ? Causing an uncomfortable physical reaction when you drink alcohol (aversion therapy).  Support groups. Support groups are led by people who have quit drinking. They provide emotional support, advice, and guidance. These forms of treatment are often combined. Some people with this condition benefit from a combination of treatments provided by specialized substance use treatment centers. Follow these instructions at home:  Take over-the-counter and prescription medicines only as told by your health care provider.  Check with your health care provider before starting any new medicines.  Ask friends and family members not to offer you alcohol.  Avoid situations where alcohol is served, including gatherings where others are drinking alcohol.  Create a plan for what to do when you are tempted to use alcohol.  Find hobbies or activities that you enjoy that do not include alcohol.  Keep all follow-up visits as told by your health care provider. This is important. How is this prevented?  If you drink, limit alcohol intake to no more than 1 drink a day for nonpregnant women and 2 drinks a day for men. One drink equals 12 oz of beer, 5 oz of wine, or 1 oz of hard liquor.  If you have a mental health  condition, get treatment and support.  Do not give alcohol to adolescents.  If you are an adolescent: ? Do not drink alcohol. ? Do not be afraid to say no if someone offers you alcohol. Speak up about why you do not want to drink. You can be a positive role model for your friends and set a good example for those around you by not drinking alcohol. ? If your friends drink, spend time with others who do not drink alcohol. Make new friends who do not use alcohol. ? Find healthy ways to manage stress and emotions, such as meditation or deep breathing, exercise, spending time in nature, listening to music, or talking with a trusted friend or family member. Contact a health care provider if:  You are not able to take your medicines as  told.  Your symptoms get worse.  You return to drinking alcohol (relapse) and your symptoms get worse. Get help right away if:  You have thoughts about hurting yourself or others. If you ever feel like you may hurt yourself or others, or have thoughts about taking your own life, get help right away. You can go to your nearest emergency department or call:  Your local emergency services (911 in the [place]  A suicide crisis helpline, such as the National Suicide Prevention Lifeline at 21562410761-229-447-5682. This is open 24 hours a day. Summary  Alcohol use disorder is when your drinking disrupts your daily life. When you have this condition, you drink too much alcohol and you cannot control your drinking.  Treatment may include detoxification, counseling, medicine, and support groups.  Ask friends and family members not to offer you alcohol. Avoid situations where alcohol is served.  Get help right away if you have thoughts about hurting yourself or others. This information is not intended to replace advice given to you by your health care provider. Make sure you discuss any questions you have with your health care provider. Document Released: 01/08/2005 Document  Revised: 08/28/2016 Document Reviewed: 08/28/2016 Elsevier Interactive Patient Education  [employer]2019 Elsevier Inc.

## 2019-05-21 NOTE — ED Notes (Signed)
[employer] officer with pt
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
## 2019-05-21 NOTE — ED Notes (Signed)
Pt yelling, states "I want to see my nurse now". RN standing in doorway. Rn informed pt that "i'm right here, what can I do for you?" pt states she wants to leave, pt is intoxicated. MD notified, MD states he is taking IVC papers out on pt.

## 2019-05-21 NOTE — ED Notes (Signed)
MD notified of hemoglobin 7.5 and hematocrit of 25. No new verbal orders received.

## 2019-06-01 ENCOUNTER — Other Ambulatory Visit: Payer: Self-pay

## 2019-06-01 DIAGNOSIS — R748 Abnormal levels of other serum enzymes: Secondary | ICD-10-CM

## 2019-06-01 DIAGNOSIS — Z87898 Personal history of other specified conditions: Secondary | ICD-10-CM

## 2019-06-01 DIAGNOSIS — Z7689 Persons encountering health services in other specified circumstances: Secondary | ICD-10-CM

## 2019-06-01 DIAGNOSIS — Z0000 Encounter for general adult medical examination without abnormal findings: Secondary | ICD-10-CM

## 2019-06-02 ENCOUNTER — Other Ambulatory Visit: Payer: Self-pay | Admitting: Gerontology

## 2019-06-02 DIAGNOSIS — D509 Iron deficiency anemia, unspecified: Secondary | ICD-10-CM

## 2019-06-02 LAB — URINALYSIS

## 2019-06-03 LAB — COMPREHENSIVE METABOLIC PANEL
ALT: 21 IU/L (ref 0–32)
AST: 35 IU/L (ref 0–40)
Albumin/Globulin Ratio: 1 — ABNORMAL LOW (ref 1.2–2.2)
Albumin: 3.8 g/dL (ref 3.8–4.8)
Alkaline Phosphatase: 74 IU/L (ref 39–117)
BUN/Creatinine Ratio: 7 — ABNORMAL LOW (ref 9–23)
BUN: 5 mg/dL — ABNORMAL LOW (ref 6–24)
Bilirubin Total: 0.3 mg/dL (ref 0.0–1.2)
CO2: 21 mmol/L (ref 20–29)
Calcium: 9 mg/dL (ref 8.7–10.2)
Chloride: 102 mmol/L (ref 96–106)
Creatinine, Ser: 0.68 mg/dL (ref 0.57–1.00)
GFR calc Af Amer: 123 mL/min/{1.73_m2} (ref >59)
GFR calc non Af Amer: 107 mL/min/{1.73_m2} (ref >59)
Globulin, Total: 3.9 g/dL (ref 1.5–4.5)
Glucose: 106 mg/dL — ABNORMAL HIGH (ref 65–99)
Potassium: 3.7 mmol/L (ref 3.5–5.2)
Sodium: 138 mmol/L (ref 134–144)
Total Protein: 7.7 g/dL (ref 6.0–8.5)

## 2019-06-03 LAB — IRON AND TIBC
Iron Saturation: 5 % — CL (ref 15–55)
Iron: 22 ug/dL — ABNORMAL LOW (ref 27–159)
Total Iron Binding Capacity: 476 ug/dL — ABNORMAL HIGH (ref 250–450)
UIBC: 454 ug/dL — ABNORMAL HIGH (ref 131–425)

## 2019-06-03 LAB — CBC WITH DIFFERENTIAL/PLATELET
Basophils Absolute: 0 10*3/uL (ref 0.0–0.2)
Basos: 0 %
EOS (ABSOLUTE): 0.1 10*3/uL (ref 0.0–0.4)
Eos: 1 %
Hematocrit: 28.4 % — ABNORMAL LOW (ref 34.0–46.6)
Hemoglobin: 8.3 g/dL — ABNORMAL LOW (ref 11.1–15.9)
Immature Grans (Abs): 0 10*3/uL (ref 0.0–0.1)
Immature Granulocytes: 0 %
Lymphocytes Absolute: 2.2 10*3/uL (ref 0.7–3.1)
Lymphs: 21 %
MCH: 18.1 pg — ABNORMAL LOW (ref 26.6–33.0)
MCHC: 29.2 g/dL — ABNORMAL LOW (ref 31.5–35.7)
MCV: 62 fL — ABNORMAL LOW (ref 79–97)
Monocytes Absolute: 1.1 10*3/uL — ABNORMAL HIGH (ref 0.1–0.9)
Monocytes: 11 %
Neutrophils Absolute: 7 10*3/uL (ref 1.4–7.0)
Neutrophils: 67 %
Platelets: 382 10*3/uL (ref 150–450)
RBC: 4.58 x10E6/uL (ref 3.77–5.28)
RDW: 20 % — ABNORMAL HIGH (ref 11.7–15.4)
WBC: 10.4 10*3/uL (ref 3.4–10.8)

## 2019-06-03 LAB — LIPID PANEL
Chol/HDL Ratio: 2.1 ratio (ref 0.0–4.4)
Cholesterol, Total: 135 mg/dL (ref 100–199)
HDL: 63 mg/dL (ref >39)
LDL Calculated: 61 mg/dL (ref 0–99)
Triglycerides: 53 mg/dL (ref 0–149)
VLDL Cholesterol Cal: 11 mg/dL (ref 5–40)

## 2019-06-03 LAB — URINALYSIS

## 2019-06-03 LAB — VALPROIC ACID LEVEL: Valproic Acid Lvl: 58 ug/mL (ref 50–100)

## 2019-06-03 LAB — FERRITIN, SERUM (SERIAL): Ferritin: 23 ng/mL (ref 15–150)

## 2019-06-06 ENCOUNTER — Ambulatory Visit: Payer: Self-pay | Admitting: Diagnostic Neuroimaging

## 2019-06-09 ENCOUNTER — Ambulatory Visit: Payer: Self-pay | Admitting: Gerontology

## 2019-06-09 ENCOUNTER — Other Ambulatory Visit: Payer: Self-pay

## 2019-06-09 DIAGNOSIS — L0232 Furuncle of buttock: Secondary | ICD-10-CM | POA: Insufficient documentation

## 2019-06-09 DIAGNOSIS — D509 Iron deficiency anemia, unspecified: Secondary | ICD-10-CM

## 2019-06-09 DIAGNOSIS — K2100 Gastro-esophageal reflux disease with esophagitis, without bleeding: Secondary | ICD-10-CM

## 2019-06-09 DIAGNOSIS — Z8659 Personal history of other mental and behavioral disorders: Secondary | ICD-10-CM

## 2019-06-09 DIAGNOSIS — F1020 Alcohol dependence, uncomplicated: Secondary | ICD-10-CM

## 2019-06-09 MED ORDER — DOXYCYCLINE HYCLATE 50 MG PO CAPS: 100.0000 mg | ORAL_CAPSULE | Freq: Two times a day (BID) | ORAL | 0 refills | Status: DC

## 2019-06-09 MED ORDER — PANTOPRAZOLE SODIUM 40 MG PO TBEC: 40.0000 mg | DELAYED_RELEASE_TABLET | Freq: Every day | ORAL | 3 refills | Status: DC

## 2019-06-09 NOTE — Progress Notes (Signed)
Established Patient Office Visit  Subjective:  Patient ID: Kara MallickKischelle Shawanna Coopman, female    DOB: 24-May-1976  Age: 43 y.o. MRN: [id number]  CC:  Chief Complaint  Patient presents with  . Follow-up    boil in butt crack that is draining yellow for about a week   Patient consents to telephone visit and 2 patient identifiers was used to identify patient. HPI [name] Kara Lawrence presents for follow up of Alcohol abuse, bipolar depression, acid reflux and Iron deficiency anemia. She was evaluated at the ED on 05/21/19 for alcohol intoxication and she reports that she has not consumed any alcoholic beverage since her hospitalization. She reports that she's doing well, but not compliant with her medications. She states that her mood is good, denies suicidal or homicidal ideation, fatigue, seizure, acid reflux, chest pain and palpitation. Currently, she reports having a boil to her gluteal cleft that has being draining yellowish pus for 1 week. She denies erythema, swelling fever, chills and otherwise no further concerns.  Past Medical History:  Diagnosis Date  . Allergy   . Bipolar 1 disorder (HCC)   . Depression   . Manic depression (HCC)    Dr Suzie PortelaMoffitt, psychiatrist  . Seizure [facility](HCC)     Past Surgical History:  Procedure Laterality Date  . TUBAL LIGATION Bilateral     No family history on file.  Social History   Socioeconomic History  . Marital status: Single    Spouse name: Not on file  . Number of children: 5  . Years of education: 8  . Highest education level: 8th grade  Occupational History  . Not on file  Social Needs  . Financial resource strain: Not on file  . Food insecurity    Worry: Not on file    Inability: Not on file  . Transportation needs    Medical: Not on file    Non-medical: Not on file  Tobacco Use  . Smoking status: Never Smoker  . Smokeless tobacco: Never Used  Substance and Sexual Activity  . Alcohol use: Not Currently  . Drug use: Yes    Types: "Crack" cocaine, Benzodiazepines, Cocaine, Marijuana  . Sexual activity: Not on file  Lifestyle  . Physical activity    Days per week: Not on file    Minutes per session: Not on file  . Stress: Not on file  Relationships  . Social Musicianconnections    Talks on phone: Not on file    Gets together: Not on file    Attends religious service: Not on file    Active member of club or organization: Not on file    Attends meetings of clubs or organizations: Not on file    Relationship status: Not on file  . Intimate partner violence    Fear of current or ex partner: Not on file    Emotionally abused: Not on file    Physically abused: Not on file    Forced sexual activity: Not on file  Other Topics Concern  . Not on file  Social History Narrative   Lives in boarding room with boyfriend    Outpatient Medications Prior to Visit  Medication Sig Dispense Refill  . divalproex (DEPAKOTE) 500 MG DR tablet Take 500 mg by mouth 2 (two) times daily.    . ferrous sulfate 325 (65 FE) MG tablet Take 1 tablet (325 mg total) by mouth daily with breakfast. 30 tablet 3  . traZODone (DESYREL) 50 MG tablet Take 50 mg by  mouth at bedtime.    . pantoprazole (PROTONIX) 40 MG tablet Take 40 mg by mouth daily.     No facility-administered medications prior to visit.     No Known Allergies  ROS Review of Systems  Constitutional: Negative.   Respiratory: Negative.   Cardiovascular: Negative.   Genitourinary: Negative.   Skin: Negative for wound.       Boil to gluteal cleft  Neurological: Negative.   Psychiatric/Behavioral: Negative.       Objective:    Physical Exam No vital sign or PE done There were no vitals taken for this visit. Wt Readings from Last 3 Encounters:  05/20/19 169 lb 15.6 oz (77.1 kg)  03/20/19 170 lb (77.1 kg)  02/22/19 142 lb 11.2 oz (64.7 kg)     Health Maintenance Due  Topic Date Due  . HIV Screening  06/26/1989  . TETANUS/TDAP  06/26/1993  . PAP SMEAR-Modifier   06/27/1995    There are no preventive care reminders to display for this patient.  Lab Results  Component Value Date   TSH 3.190 02/23/2019   Lab Results  Component Value Date   WBC 10.4 06/01/2019   HGB 8.3 (L) 06/01/2019   HCT 28.4 (L) 06/01/2019   MCV 62 (L) 06/01/2019   PLT 382 06/01/2019   Lab Results  Component Value Date   NA 138 06/01/2019   K 3.7 06/01/2019   CO2 21 06/01/2019   GLUCOSE 106 (H) 06/01/2019   BUN 5 (L) 06/01/2019   CREATININE 0.68 06/01/2019   BILITOT 0.3 06/01/2019   ALKPHOS 74 06/01/2019   AST 35 06/01/2019   ALT 21 06/01/2019   PROT 7.7 06/01/2019   ALBUMIN 3.8 06/01/2019   CALCIUM 9.0 06/01/2019   ANIONGAP 12 05/20/2019   Lab Results  Component Value Date   CHOL 135 06/01/2019   Lab Results  Component Value Date   HDL 63 06/01/2019   Lab Results  Component Value Date   LDLCALC 61 06/01/2019   Lab Results  Component Value Date   TRIG 53 06/01/2019   Lab Results  Component Value Date   CHOLHDL 2.1 06/01/2019   Lab Results  Component Value Date   HGBA1C 4.9 02/23/2019      Assessment & Plan:    1. Alcohol use disorder, severe, dependence ([place]) - She was encouraged to abstain from alcoholic beverages.  2. Iron deficiency anemia, unspecified iron deficiency anemia type - She was encouraged to continue taking 325 mg Ferrous sulfate and consume dry beans and dark leafy vegetables.  3. Gastroesophageal reflux disease with esophagitis - She will continue on protonix and will recheck vitamin B12 and magnesium. - pantoprazole (PROTONIX) 40 MG tablet; Take 1 tablet (40 mg total) by mouth daily.  Dispense: 30 tablet; Refill: 3 -Avoid spicy, fatty and fried food -Avoid sodas and sour juices -Avoid heavy meals -Avoid eating 4 hours before bedtime -Elevate head of bed at night    4. History of bipolar disorder -She will continue on current treatment plan and follow up with Dr Ernie Hew.  5. Boil of buttock - Possible MRSA ?  Will continue on doxycycline and was eductaed on medication side effects, she was advised to notify clinic. - doxycycline (VIBRAMYCIN) 50 MG capsule; Take 2 capsules (100 mg total) by mouth 2 (two) times daily.  Dispense: 14 capsule; Refill: 0    Follow-up: Return in about 2 months (around 08/09/2019), or if symptoms worsen or fail to improve.    Dawt Reeb E  Hattie Perch, NP

## 2019-06-29 ENCOUNTER — Ambulatory Visit: Payer: Self-pay | Attending: Oncology | Admitting: *Deleted

## 2019-06-29 ENCOUNTER — Other Ambulatory Visit: Payer: Self-pay

## 2019-06-29 ENCOUNTER — Ambulatory Visit
Admission: RE | Admit: 2019-06-29 | Discharge: 2019-06-29 | Disposition: A | Discharge status: Home / Self Care | Payer: Self-pay | Source: Ambulatory Visit | Attending: Oncology | Admitting: Oncology

## 2019-06-29 VITALS — BP 114/74 | HR 92 | Temp 98.2°F | Ht 63.0 in | Wt 141.0 lb

## 2019-06-29 DIAGNOSIS — Z0000 Encounter for general adult medical examination without abnormal findings: Secondary | ICD-10-CM | POA: Insufficient documentation

## 2019-06-29 NOTE — Progress Notes (Signed)
  Subjective:     Patient ID: Kara Lawrence, female   DOB: [birth year], 43 y.o.   MRN: [id number]  HPI   Review of Systems     Objective:   Physical Exam Chest:     Breasts:        Right: No swelling, bleeding, inverted nipple, mass, nipple discharge, skin change or tenderness.        Left: No swelling, bleeding, inverted nipple, mass, nipple discharge, skin change or tenderness.    Abdominal:     Palpations: There is no hepatomegaly or splenomegaly.  Genitourinary:    Exam position: Lithotomy position.     Labia:        Right: No rash, tenderness, lesion or injury.        Left: No rash, tenderness, lesion or injury.      Urethra: No prolapse, urethral pain, urethral swelling or urethral lesion.     Vagina: No signs of injury and foreign body. No vaginal discharge, erythema, tenderness, bleeding, lesions or prolapsed vaginal walls.     Cervix: No cervical motion tenderness, discharge, friability, lesion, erythema, cervical bleeding or eversion.     Uterus: Not deviated, not enlarged, not fixed, not tender and no uterine prolapse.      Adnexa:        Right: No mass, tenderness or fullness.         Left: No mass, tenderness or fullness.          Comments: Refused rectal exam Lymphadenopathy:     Upper Body:     Right upper body: No supraclavicular or axillary adenopathy.     Left upper body: No supraclavicular or axillary adenopathy.        Assessment:     43 year old black female presents to [facility] for clinical breast exam, pap and mammogram.  Clinical breast exam unremarkable.  Taught self breast awareness.  Specimen collected for pap smear without difficulty.  Patient complains of heavy bleeding.  She is currently taking Iron as prescribed by the [facility].  Informed we could not refer her to gyn for the heavy bleeding unless she had an abnormal pap smear.  Will review with patient again when results are available.Patient has been screened for  eligibility.  She does not have any insurance, Medicare or Medicaid.  She also meets financial eligibility.  Hand-out given on the Affordable Care Act. Risk Assessment    Risk Scores      06/29/2019   Last edited by: Theodore Demark, RN   5-year risk: 0.9 %   Lifetime risk: 9.3 %            Plan:     Screening mammogram ordered.  Specimen for pap sent to the lab.  Will follow up per [facility] protocol.

## 2019-07-08 ENCOUNTER — Encounter: Payer: Self-pay | Admitting: *Deleted

## 2019-07-08 LAB — PAP LB AND HPV HIGH-RISK: HPV, high-risk: NEGATIVE

## 2019-07-08 NOTE — Progress Notes (Signed)
Called patient to inform her of her mammogram and pap results and need for treatment of Trichomonas.  No answer.  Left message for patient to return my call.

## 2019-07-19 ENCOUNTER — Emergency Department: Payer: Self-pay

## 2019-07-19 ENCOUNTER — Emergency Department
Admission: EM | Admit: 2019-07-19 | Discharge: 2019-07-19 | Payer: Self-pay | Attending: Emergency Medicine | Admitting: Emergency Medicine

## 2019-07-19 ENCOUNTER — Other Ambulatory Visit: Payer: Self-pay

## 2019-07-19 DIAGNOSIS — Y999 Unspecified external cause status: Secondary | ICD-10-CM | POA: Insufficient documentation

## 2019-07-19 DIAGNOSIS — Z79899 Other long term (current) drug therapy: Secondary | ICD-10-CM | POA: Insufficient documentation

## 2019-07-19 DIAGNOSIS — S0121XA Laceration without foreign body of nose, initial encounter: Secondary | ICD-10-CM | POA: Insufficient documentation

## 2019-07-19 DIAGNOSIS — M79601 Pain in right arm: Secondary | ICD-10-CM | POA: Insufficient documentation

## 2019-07-19 DIAGNOSIS — Y929 Unspecified place or not applicable: Secondary | ICD-10-CM | POA: Insufficient documentation

## 2019-07-19 DIAGNOSIS — S0990XA Unspecified injury of head, initial encounter: Secondary | ICD-10-CM | POA: Insufficient documentation

## 2019-07-19 DIAGNOSIS — S01412A Laceration without foreign body of left cheek and temporomandibular area, initial encounter: Secondary | ICD-10-CM | POA: Insufficient documentation

## 2019-07-19 DIAGNOSIS — S01511A Laceration without foreign body of lip, initial encounter: Secondary | ICD-10-CM | POA: Insufficient documentation

## 2019-07-19 DIAGNOSIS — Y939 Activity, unspecified: Secondary | ICD-10-CM | POA: Insufficient documentation

## 2019-07-19 LAB — COMPREHENSIVE METABOLIC PANEL
ALT: 30 U/L (ref 0–44)
AST: 98 U/L — ABNORMAL HIGH (ref 15–41)
Albumin: 3.3 g/dL — ABNORMAL LOW (ref 3.5–5.0)
Alkaline Phosphatase: 79 U/L (ref 38–126)
Anion gap: 23 — ABNORMAL HIGH (ref 5–15)
BUN: 7 mg/dL (ref 6–20)
CO2: 10 mmol/L — ABNORMAL LOW (ref 22–32)
Calcium: 8.7 mg/dL — ABNORMAL LOW (ref 8.9–10.3)
Chloride: 105 mmol/L (ref 98–111)
Creatinine, Ser: 1.03 mg/dL — ABNORMAL HIGH (ref 0.44–1.00)
GFR calc Af Amer: >60 mL/min (ref >60)
GFR calc non Af Amer: >60 mL/min (ref >60)
Glucose, Bld: 190 mg/dL — ABNORMAL HIGH (ref 70–99)
Potassium: 3.2 mmol/L — ABNORMAL LOW (ref 3.5–5.1)
Sodium: 138 mmol/L (ref 135–145)
Total Bilirubin: 0.4 mg/dL (ref 0.3–1.2)
Total Protein: 7.8 g/dL (ref 6.5–8.1)

## 2019-07-19 LAB — PROTIME-INR
INR: 1.5 — ABNORMAL HIGH (ref 0.8–1.2)
Prothrombin Time: 18.2 seconds — ABNORMAL HIGH (ref 11.4–15.2)

## 2019-07-19 LAB — CBC WITH DIFFERENTIAL/PLATELET
Abs Immature Granulocytes: 0.29 10*3/uL — ABNORMAL HIGH (ref 0.00–0.07)
Basophils Absolute: 0.1 10*3/uL (ref 0.0–0.1)
Basophils Relative: 0 %
Eosinophils Absolute: 0.1 10*3/uL (ref 0.0–0.5)
Eosinophils Relative: 0 %
HCT: 26.7 % — ABNORMAL LOW (ref 36.0–46.0)
Hemoglobin: 7.3 g/dL — ABNORMAL LOW (ref 12.0–15.0)
Immature Granulocytes: 2 %
Lymphocytes Relative: 49 %
Lymphs Abs: 7.7 10*3/uL — ABNORMAL HIGH (ref 0.7–4.0)
MCH: 18.2 pg — ABNORMAL LOW (ref 26.0–34.0)
MCHC: 27.3 g/dL — ABNORMAL LOW (ref 30.0–36.0)
MCV: 66.4 fL — ABNORMAL LOW (ref 80.0–100.0)
Monocytes Absolute: 1.2 10*3/uL — ABNORMAL HIGH (ref 0.1–1.0)
Monocytes Relative: 8 %
Neutro Abs: 6.4 10*3/uL (ref 1.7–7.7)
Neutrophils Relative %: 41 %
Platelets: 759 10*3/uL — ABNORMAL HIGH (ref 150–400)
RBC: 4.02 MIL/uL (ref 3.87–5.11)
RDW: 20 % — ABNORMAL HIGH (ref 11.5–15.5)
Smear Review: NORMAL
WBC: 15.6 10*3/uL — ABNORMAL HIGH (ref 4.0–10.5)
nRBC: 0.3 % — ABNORMAL HIGH (ref 0.0–0.2)

## 2019-07-19 LAB — LIPASE, BLOOD: Lipase: 64 U/L — ABNORMAL HIGH (ref 11–51)

## 2019-07-19 LAB — TYPE AND SCREEN
ABO/RH(D): O POS
Antibody Screen: NEGATIVE

## 2019-07-19 LAB — ETHANOL: Alcohol, Ethyl (B): 67 mg/dL — ABNORMAL HIGH (ref <10)

## 2019-07-19 MED ORDER — HYDROMORPHONE HCL 1 MG/ML IJ SOLN
INTRAMUSCULAR | Status: AC
  Administered 2019-07-19: 07:00:00 1 mg via INTRAVENOUS
  Filled 2019-07-19: qty 1

## 2019-07-19 MED ORDER — SODIUM CHLORIDE 0.9 % IV BOLUS
1000.0000 mL | Freq: Once | INTRAVENOUS | Status: AC
  Administered 2019-07-19: 1000 mL via INTRAVENOUS

## 2019-07-19 MED ORDER — MORPHINE SULFATE (PF) 4 MG/ML IV SOLN
4.0000 mg | Freq: Once | INTRAVENOUS | Status: AC
  Administered 2019-07-19: 4 mg via INTRAVENOUS

## 2019-07-19 MED ORDER — HYDROMORPHONE HCL 1 MG/ML IJ SOLN
1.0000 mg | INTRAMUSCULAR | Status: AC
  Administered 2019-07-19: 07:00:00 1 mg via INTRAVENOUS

## 2019-07-19 MED ORDER — ONDANSETRON HCL 4 MG/2ML IJ SOLN
4.0000 mg | INTRAMUSCULAR | Status: AC
  Administered 2019-07-19: 4 mg via INTRAVENOUS

## 2019-07-19 MED ORDER — DIPHENHYDRAMINE HCL 50 MG/ML IJ SOLN
12.5000 mg | INTRAMUSCULAR | Status: AC
  Administered 2019-07-19: 12.5 mg via INTRAVENOUS
  Filled 2019-07-19: qty 1

## 2019-07-19 MED ORDER — DROPERIDOL 2.5 MG/ML IJ SOLN
2.5000 mg | Freq: Once | INTRAMUSCULAR | Status: AC
  Administered 2019-07-19: 2.5 mg via INTRAVENOUS
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
## 2019-07-19 NOTE — ED Notes (Addendum)
Pt transported to CT for scan.

## 2019-07-19 NOTE — ED Notes (Signed)
XRAY  POWERSHARE  WITH  [facility] 
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
## 2019-07-19 NOTE — ED Notes (Addendum)
Pt being transferred by [facility] air care - she is alert and oriented x4 - she is c/o pain to jaw/nose - she has loosened the c-collar and is attempting to take it off (has been since I took over at 7am) - pt unable to sign for transfer at this time d/t pain and medicated by [employer]

## 2019-07-21 DIAGNOSIS — T7491XA Unspecified adult maltreatment, confirmed, initial encounter: Secondary | ICD-10-CM | POA: Insufficient documentation

## 2019-07-21 DIAGNOSIS — F1290 Cannabis use, unspecified, uncomplicated: Secondary | ICD-10-CM | POA: Insufficient documentation

## 2019-07-21 DIAGNOSIS — S52601D Unspecified fracture of lower end of right ulna, subsequent encounter for closed fracture with routine healing: Secondary | ICD-10-CM | POA: Insufficient documentation

## 2019-07-27 ENCOUNTER — Encounter: Payer: Self-pay | Admitting: *Deleted

## 2019-07-27 NOTE — Progress Notes (Signed)
Patient returned my call to Al Pimple, RN.  Patient stated to call in her prescription for treatment of Trichomonas to Medication Management.  Prescription for Metronidazole 2 gms by mouth once called in to medication Management per protocol.  Webb Silversmith called patient to inform her that a prescription had been called in.

## 2019-07-31 DIAGNOSIS — D509 Iron deficiency anemia, unspecified: Secondary | ICD-10-CM | POA: Insufficient documentation

## 2019-07-31 DIAGNOSIS — N939 Abnormal uterine and vaginal bleeding, unspecified: Secondary | ICD-10-CM

## 2019-07-31 HISTORY — DX: Abnormal uterine and vaginal bleeding, unspecified: N93.9

## 2019-08-04 ENCOUNTER — Ambulatory Visit: Payer: Self-pay | Admitting: Gerontology

## 2019-08-25 ENCOUNTER — Ambulatory Visit: Payer: Self-pay | Admitting: Gerontology

## 2019-08-25 ENCOUNTER — Other Ambulatory Visit: Payer: Self-pay

## 2019-08-25 ENCOUNTER — Encounter: Payer: Self-pay | Admitting: Gerontology

## 2019-08-25 VITALS — BP 103/75 | HR 100 | Temp 97.3°F | Wt 135.0 lb

## 2019-08-25 DIAGNOSIS — F319 Bipolar disorder, unspecified: Secondary | ICD-10-CM

## 2019-08-25 DIAGNOSIS — R899 Unspecified abnormal finding in specimens from other organs, systems and tissues: Secondary | ICD-10-CM | POA: Insufficient documentation

## 2019-08-25 DIAGNOSIS — K2100 Gastro-esophageal reflux disease with esophagitis, without bleeding: Secondary | ICD-10-CM

## 2019-08-25 DIAGNOSIS — S0181XS Laceration without foreign body of other part of head, sequela: Secondary | ICD-10-CM

## 2019-08-25 DIAGNOSIS — K219 Gastro-esophageal reflux disease without esophagitis: Secondary | ICD-10-CM | POA: Insufficient documentation

## 2019-08-25 MED ORDER — PANTOPRAZOLE SODIUM 40 MG PO TBEC: 40.0000 mg | DELAYED_RELEASE_TABLET | Freq: Every day | ORAL | 3 refills | Status: DC
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
## 2019-08-28 DIAGNOSIS — S0181XA Laceration without foreign body of other part of head, initial encounter: Secondary | ICD-10-CM | POA: Insufficient documentation
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
## 2019-09-27 ENCOUNTER — Ambulatory Visit: Payer: Self-pay | Admitting: Gerontology

## 2019-09-27 ENCOUNTER — Other Ambulatory Visit: Payer: Self-pay

## 2019-09-27 ENCOUNTER — Encounter: Payer: Self-pay | Admitting: Gerontology

## 2019-09-27 VITALS — BP 94/69 | HR 94 | Wt 132.0 lb

## 2019-09-27 DIAGNOSIS — J329 Chronic sinusitis, unspecified: Secondary | ICD-10-CM | POA: Insufficient documentation

## 2019-09-27 DIAGNOSIS — J0100 Acute maxillary sinusitis, unspecified: Secondary | ICD-10-CM

## 2019-09-27 DIAGNOSIS — R05 Cough: Secondary | ICD-10-CM

## 2019-09-27 DIAGNOSIS — R059 Cough, unspecified: Secondary | ICD-10-CM | POA: Insufficient documentation

## 2019-09-27 MED ORDER — BENZONATATE 100 MG PO CAPS: 100.0000 mg | ORAL_CAPSULE | Freq: Three times a day (TID) | ORAL | 0 refills | Status: DC | PRN

## 2019-09-27 MED ORDER — FEXOFENADINE HCL 30 MG PO TBDP: 30.0000 mg | ORAL_TABLET | Freq: Every day | ORAL | 0 refills | Status: DC

## 2019-09-27 MED ORDER — SALINE SPRAY 0.65 % NA SOLN: 1.0000 | NASAL | 0 refills | Status: DC | PRN

## 2019-09-27 NOTE — Progress Notes (Signed)
Established Patient Office Visit  Subjective:  Patient ID: Kara Lawrence, female    DOB: 1976/08/01  Age: 43 y.o. MRN: [id number]  CC:  Chief Complaint  Patient presents with  . Gastroesophageal Reflux    HPI Kara Lawrence presents for c/o that her seasonal allergy is worsening. She reports that she experiencing intermitent sinus congestion, rhinorrhea, and cough with clear phlegm. She states that using otc medications didn't relieve her symptoms. She denies chest pain, shortness of breath, fever, chills, change in sense of taste or smell and myalgia. She states that she will reschedule her [facility] appointment. The laceration to left aspect of her face has healed up, no swelling or erythema. She reports that she was doing well and offers no further concerns.  Past Medical History:  Diagnosis Date  . Allergy   . Bipolar 1 disorder ([place])   . Depression   . Manic depression ([place])    Dr Randel Books, psychiatrist  . Seizure [facility])     Past Surgical History:  Procedure Laterality Date  . TUBAL LIGATION Bilateral     Family History  Problem Relation Age of Onset  . Breast cancer Maternal Aunt 67    Social History   Socioeconomic History  . Marital status: Single    Spouse name: Not on file  . Number of children: 5  . Years of education: 8  . Highest education level: 8th grade  Occupational History  . Not on file  Social Needs  . Financial resource strain: Not on file  . Food insecurity    Worry: Not on file    Inability: Not on file  . Transportation needs    Medical: Not on file    Non-medical: Not on file  Tobacco Use  . Smoking status: Never Smoker  . Smokeless tobacco: Never Used  Substance and Sexual Activity  . Alcohol use: Not Currently  . Drug use: Yes    Types: "Crack" cocaine, Benzodiazepines, Cocaine, Marijuana  . Sexual activity: Not on file  Lifestyle  . Physical activity    Days per week: Not on file    Minutes per session:  Not on file  . Stress: Not on file  Relationships  . Social Herbalist on phone: Not on file    Gets together: Not on file    Attends religious service: Not on file    Active member of club or organization: Not on file    Attends meetings of clubs or organizations: Not on file    Relationship status: Not on file  . Intimate partner violence    Fear of current or ex partner: Not on file    Emotionally abused: Not on file    Physically abused: Not on file    Forced sexual activity: Not on file  Other Topics Concern  . Not on file  Social History Narrative   Lives in boarding room with boyfriend    Outpatient Medications Prior to Visit  Medication Sig Dispense Refill  . divalproex (DEPAKOTE) 500 MG DR tablet Take 500 mg by mouth 2 (two) times daily.    . ferrous sulfate 325 (65 FE) MG tablet Take 1 tablet (325 mg total) by mouth daily with breakfast. 30 tablet 3  . pantoprazole (PROTONIX) 40 MG tablet Take 1 tablet (40 mg total) by mouth daily. 30 tablet 3  . traZODone (DESYREL) 50 MG tablet Take 50 mg by mouth at bedtime.     No facility-administered  medications prior to visit.     No Known Allergies  ROS Review of Systems  Constitutional: Negative.   HENT: Positive for congestion, rhinorrhea and sinus pressure.   Eyes: Negative.   Respiratory: Positive for cough.   Cardiovascular: Negative.   Skin: Negative.   Neurological: Negative.   Psychiatric/Behavioral: Negative.       Objective:    Physical Exam  Constitutional: She is oriented to person, place, and time. She appears well-developed.  HENT:  Head: Normocephalic.  Eyes: Pupils are equal, round, and reactive to light. EOM are normal.  Cardiovascular: Normal rate and regular rhythm.  Pulmonary/Chest: Effort normal and breath sounds normal.  Abdominal: Soft.  Neurological: She is alert and oriented to person, place, and time.  Skin: Skin is warm and dry.  Psychiatric: Her behavior is normal.  Judgment and thought content normal. Her affect is angry. She is not aggressive. Cognition and memory are normal.    BP 94/69 (BP Location: Left Arm, Patient Position: Sitting)   Pulse 94   Wt 132 lb (59.9 kg)   BMI 23.38 kg/m  Wt Readings from Last 3 Encounters:  09/27/19 132 lb (59.9 kg)  08/25/19 135 lb (61.2 kg)  07/19/19 172 lb (78 kg)     Health Maintenance Due  Topic Date Due  . HIV Screening  06/26/1989    There are no preventive care reminders to display for this patient.  Lab Results  Component Value Date   TSH 3.190 02/23/2019   Lab Results  Component Value Date   WBC 15.6 (H) 07/19/2019   HGB 7.3 (L) 07/19/2019   HCT 26.7 (L) 07/19/2019   MCV 66.4 (L) 07/19/2019   PLT 759 (H) 07/19/2019   Lab Results  Component Value Date   NA 138 07/19/2019   K 3.2 (L) 07/19/2019   CO2 10 (L) 07/19/2019   GLUCOSE 190 (H) 07/19/2019   BUN 7 07/19/2019   CREATININE 1.03 (H) 07/19/2019   BILITOT 0.4 07/19/2019   ALKPHOS 79 07/19/2019   AST 98 (H) 07/19/2019   ALT 30 07/19/2019   PROT 7.8 07/19/2019   ALBUMIN 3.3 (L) 07/19/2019   CALCIUM 8.7 (L) 07/19/2019   ANIONGAP 23 (H) 07/19/2019   Lab Results  Component Value Date   CHOL 135 06/01/2019   Lab Results  Component Value Date   HDL 63 06/01/2019   Lab Results  Component Value Date   LDLCALC 61 06/01/2019   Lab Results  Component Value Date   TRIG 53 06/01/2019   Lab Results  Component Value Date   CHOLHDL 2.1 06/01/2019   Lab Results  Component Value Date   HGBA1C 4.9 02/23/2019      Assessment & Plan:     1. Cough - It's likely Viral in etiology, was encouraged to increase fluid intake. - benzonatate (TESSALON PERLES) 100 MG capsule; Take 1 capsule (100 mg total) by mouth 3 (three) times daily as needed for cough.  Dispense: 20 capsule; Refill: 0  2. Acute maxillary sinusitis, recurrence not specified - She will continue on Allegra, was educated on side effects and notified to notify  clinic. - fexofenadine (ALLEGRA ODT) 30 MG disintegrating tablet; Take 1 tablet (30 mg total) by mouth daily.  Dispense: 30 tablet; Refill: 0 - sodium chloride (OCEAN) 0.65 % SOLN nasal spray; Place 1 spray into both nostrils as needed for congestion.  Dispense: 15 mL; Refill: 0   Follow-up: Return in about 5 weeks (around 11/01/2019), or if symptoms worsen  or fail to improve.    Danner Paulding Jerold Coombe, NP

## 2019-09-27 NOTE — Patient Instructions (Signed)

## 2019-09-29 ENCOUNTER — Other Ambulatory Visit: Payer: Self-pay

## 2019-09-29 MED ORDER — LORATADINE 10 MG PO TABS: 10.0000 mg | ORAL_TABLET | Freq: Every day | ORAL | 2 refills | Status: DC

## 2019-11-01 ENCOUNTER — Other Ambulatory Visit: Payer: Self-pay

## 2019-11-01 ENCOUNTER — Ambulatory Visit: Payer: Self-pay | Admitting: Gerontology

## 2019-11-01 VITALS — BP 106/75 | HR 96 | Wt 131.0 lb

## 2019-11-01 DIAGNOSIS — K2100 Gastro-esophageal reflux disease with esophagitis, without bleeding: Secondary | ICD-10-CM

## 2019-11-01 DIAGNOSIS — K219 Gastro-esophageal reflux disease without esophagitis: Secondary | ICD-10-CM

## 2019-11-01 DIAGNOSIS — R899 Unspecified abnormal finding in specimens from other organs, systems and tissues: Secondary | ICD-10-CM

## 2019-11-01 NOTE — Progress Notes (Signed)
Established Patient Office Visit  Subjective:  Patient ID: Kara Lawrence, female    DOB: December 16, 1975  Age: 43 y.o. MRN: [id number]  CC:  Chief Complaint  Patient presents with  . Cough    HPI Kara Lawrence presents for follow up of cough and acid reflux. She states that her cough is resolved with taking Tessalon pearl. She also states that her acid reflux is controlled with taking Protonix 40 mg daily. Her AST done 3 months ago was 98 U/L, she states that she drinks couple of beers 2-3 times daily. She states that her mood is good, denies suicidal or homicidal ideation, and continues to follow up at [facility]. She denies chest pain, palpitation, shortness of breath, fever and chills. She states that she's doing well and offers no further complaint.  Past Medical History:  Diagnosis Date  . Allergy   . Bipolar 1 disorder ([place])   . Depression   . Manic depression ([place])    Dr Randel Books, psychiatrist  . Seizure [facility])     Past Surgical History:  Procedure Laterality Date  . TUBAL LIGATION Bilateral     Family History  Problem Relation Age of Onset  . Breast cancer Maternal Aunt 67    Social History   Socioeconomic History  . Marital status: Single    Spouse name: Not on file  . Number of children: 5  . Years of education: 8  . Highest education level: 8th grade  Occupational History  . Not on file  Social Needs  . Financial resource strain: Not on file  . Food insecurity    Worry: Not on file    Inability: Not on file  . Transportation needs    Medical: Not on file    Non-medical: Not on file  Tobacco Use  . Smoking status: Never Smoker  . Smokeless tobacco: Never Used  Substance and Sexual Activity  . Alcohol use: Not Currently  . Drug use: Yes    Types: "Crack" cocaine, Benzodiazepines, Cocaine, Marijuana  . Sexual activity: Not on file  Lifestyle  . Physical activity    Days per week: Not on file    Minutes per session: Not on file  .  Stress: Not on file  Relationships  . Social Herbalist on phone: Not on file    Gets together: Not on file    Attends religious service: Not on file    Active member of club or organization: Not on file    Attends meetings of clubs or organizations: Not on file    Relationship status: Not on file  . Intimate partner violence    Fear of current or ex partner: Not on file    Emotionally abused: Not on file    Physically abused: Not on file    Forced sexual activity: Not on file  Other Topics Concern  . Not on file  Social History Narrative   Lives in boarding room with boyfriend    Outpatient Medications Prior to Visit  Medication Sig Dispense Refill  . divalproex (DEPAKOTE) 500 MG DR tablet Take 500 mg by mouth 2 (two) times daily.    . ferrous sulfate 325 (65 FE) MG tablet Take 1 tablet (325 mg total) by mouth daily with breakfast. 30 tablet 3  . loratadine (CLARITIN) 10 MG tablet Take 1 tablet (10 mg total) by mouth daily. 30 tablet 2  . pantoprazole (PROTONIX) 40 MG tablet Take 1 tablet (40 mg total)  by mouth daily. 30 tablet 3  . sodium chloride (OCEAN) 0.65 % SOLN nasal spray Place 1 spray into both nostrils as needed for congestion. 15 mL 0  . traZODone (DESYREL) 50 MG tablet Take 50 mg by mouth at bedtime.    . benzonatate (TESSALON PERLES) 100 MG capsule Take 1 capsule (100 mg total) by mouth 3 (three) times daily as needed for cough. 20 capsule 0   No facility-administered medications prior to visit.     No Known Allergies  ROS Review of Systems  Constitutional: Negative.   Respiratory: Negative.   Cardiovascular: Negative.   Gastrointestinal: Negative.   Skin: Negative.   Neurological: Negative.   Psychiatric/Behavioral: Negative.       Objective:    Physical Exam  Constitutional: She is oriented to person, place, and time. She appears well-developed.  HENT:  Head: Normocephalic and atraumatic.  Eyes: Pupils are equal, round, and reactive to  light. EOM are normal.  Cardiovascular: Normal rate and regular rhythm.  Pulmonary/Chest: Effort normal and breath sounds normal.  Abdominal: Soft. Bowel sounds are normal.  Neurological: She is alert and oriented to person, place, and time.  Skin: Skin is warm and dry.  Psychiatric: She has a normal mood and affect. Her behavior is normal. Judgment and thought content normal.    BP 106/75 (BP Location: Left Arm, Patient Position: Sitting)   Pulse 96   Wt 131 lb (59.4 kg)   BMI 23.21 kg/m  Wt Readings from Last 3 Encounters:  11/01/19 131 lb (59.4 kg)  09/27/19 132 lb (59.9 kg)  08/25/19 135 lb (61.2 kg)     Health Maintenance Due  Topic Date Due  . HIV Screening  06/26/1989    There are no preventive care reminders to display for this patient.  Lab Results  Component Value Date   TSH 3.190 02/23/2019   Lab Results  Component Value Date   WBC 15.6 (H) 07/19/2019   HGB 7.3 (L) 07/19/2019   HCT 26.7 (L) 07/19/2019   MCV 66.4 (L) 07/19/2019   PLT 759 (H) 07/19/2019   Lab Results  Component Value Date   NA 138 07/19/2019   K 3.2 (L) 07/19/2019   CO2 10 (L) 07/19/2019   GLUCOSE 190 (H) 07/19/2019   BUN 7 07/19/2019   CREATININE 1.03 (H) 07/19/2019   BILITOT 0.4 07/19/2019   ALKPHOS 79 07/19/2019   AST 98 (H) 07/19/2019   ALT 30 07/19/2019   PROT 7.8 07/19/2019   ALBUMIN 3.3 (L) 07/19/2019   CALCIUM 8.7 (L) 07/19/2019   ANIONGAP 23 (H) 07/19/2019   Lab Results  Component Value Date   CHOL 135 06/01/2019   Lab Results  Component Value Date   HDL 63 06/01/2019   Lab Results  Component Value Date   LDLCALC 61 06/01/2019   Lab Results  Component Value Date   TRIG 53 06/01/2019   Lab Results  Component Value Date   CHOLHDL 2.1 06/01/2019   Lab Results  Component Value Date   HGBA1C 4.9 02/23/2019      Assessment & Plan:      1. Gastroesophageal reflux disease without esophagitis - Her acid reflux is controlled and she will continue on  current treatment regimen. - She was advised to Avoid spicy, fatty and fried food -Avoid sodas and sour juices -Avoid heavy meals -Avoid eating 4 hours before bedtime -Elevate head of bed at night  2. Abnormal laboratory test - Chemistry and CBC will be rechecked, she  will follow up at [place] for evaluation of possible hysterectomy. She was also encouraged on alcohol abstinence. - Comp Met (CMET); Future - CBC w/Diff; Future   Follow-up: Return in about 13 weeks (around 01/31/2020), or if symptoms worsen or fail to improve.    Gordon Carlson Jerold Coombe, NP

## 2019-11-14 ENCOUNTER — Other Ambulatory Visit: Payer: Self-pay

## 2019-11-14 ENCOUNTER — Encounter: Payer: Self-pay | Admitting: Emergency Medicine

## 2019-11-14 ENCOUNTER — Emergency Department
Admission: EM | Admit: 2019-11-14 | Discharge: 2019-11-14 | Disposition: A | Discharge status: Home / Self Care | Payer: Self-pay | Attending: Emergency Medicine | Admitting: Emergency Medicine

## 2019-11-14 DIAGNOSIS — M7918 Myalgia, other site: Secondary | ICD-10-CM

## 2019-11-14 DIAGNOSIS — M791 Myalgia, unspecified site: Secondary | ICD-10-CM | POA: Insufficient documentation

## 2019-11-14 DIAGNOSIS — F319 Bipolar disorder, unspecified: Secondary | ICD-10-CM | POA: Insufficient documentation

## 2019-11-14 DIAGNOSIS — Z79899 Other long term (current) drug therapy: Secondary | ICD-10-CM | POA: Insufficient documentation

## 2019-11-14 DIAGNOSIS — R569 Unspecified convulsions: Secondary | ICD-10-CM | POA: Insufficient documentation

## 2019-11-14 LAB — URINALYSIS, COMPLETE (UACMP) WITH MICROSCOPIC
Bacteria, UA: NONE SEEN
Bilirubin Urine: NEGATIVE
Glucose, UA: NEGATIVE mg/dL
Hgb urine dipstick: NEGATIVE
Ketones, ur: NEGATIVE mg/dL
Leukocytes,Ua: NEGATIVE
Nitrite: NEGATIVE
Protein, ur: NEGATIVE mg/dL
Specific Gravity, Urine: 1.019 (ref 1.005–1.030)
pH: 5 (ref 5.0–8.0)

## 2019-11-14 LAB — POCT PREGNANCY, URINE: Preg Test, Ur: NEGATIVE

## 2019-11-14 MED ORDER — NAPROXEN 500 MG PO TABS
500.0000 mg | ORAL_TABLET | Freq: Once | ORAL | Status: AC
  Administered 2019-11-14: 08:00:00 500 mg via ORAL
  Filled 2019-11-14: qty 1

## 2019-11-14 MED ORDER — NAPROXEN 500 MG PO TABS: 500.0000 mg | ORAL_TABLET | Freq: Two times a day (BID) | ORAL | 0 refills | Status: DC

## 2019-11-14 MED ORDER — KETOROLAC TROMETHAMINE 30 MG/ML IJ SOLN
30.0000 mg | Freq: Once | INTRAMUSCULAR | Status: DC
  Filled 2019-11-14: qty 1

## 2019-11-14 NOTE — ED Notes (Signed)
Assumed cre of patient, patient sleeping comfortably, psych eval pending. Safety maintained.

## 2019-11-14 NOTE — ED Notes (Signed)
Went over our discharge instructions    States she is wishing to speak with mental health   Pt moved to 19h via w/c

## 2019-11-14 NOTE — ED Notes (Signed)
Pt was refusing temperature for flex nurses. Pt sleeping with head under blanket at this time.

## 2019-11-14 NOTE — ED Notes (Signed)
When asked if pt was able to produce a urine specimen, pt said, "No I'm not. I'm not getting up again." Pt refusing to let this tech check temperature. Pt stating, "Get up out of here." Vaughan Basta, RN notified and provider notified.

## 2019-11-14 NOTE — ED Notes (Signed)
See triage note  Presents with lower back pain  States pain started this am  Denies any injury   Ambulates slowly d/t pain

## 2019-11-14 NOTE — ED Notes (Signed)
Pt states that she has a lot of stress going on in her life and needs to speak with someone. Pt states that she has had some thoughts of hurting herself but does not have a plan. Pt states "I'm about at that point though." Pt sleeping prior to this RN waking pt up. Pt goes back to sleep. Pt sounds sad and anxious.

## 2019-11-14 NOTE — ED Triage Notes (Signed)
Patient brought in by ems. Patient with complaint of pain across her lower back that started yesterday.

## 2019-11-14 NOTE — ED Provider Notes (Signed)
[facility] Emergency Department Provider Note   ____________________________________________   None    (approximate)  I have reviewed the triage vital signs and the nursing notes.   HISTORY  Chief Complaint Back Pain   HPI Kara Lawrence is a 43 y.o. female presents to the ED with complaint of low back pain.  Patient states that it started this morning and denies any injury to her back.  Patient does not give a a lot of history.  She also refuses to have her temperature taken and give a urine specimen.  She reports that she does not take any over-the-counter medication for her pain.  She rates her pain as a 10/10.     Past Medical History:  Diagnosis Date  . Allergy   . Bipolar 1 disorder (HCC)   . Depression   . Manic depression (HCC)    Dr Suzie PortelaMoffitt, psychiatrist  . Seizure [facility](HCC)     Patient Active Problem List   Diagnosis Date Noted  . Cough 09/27/2019  . Sinusitis 09/27/2019  . Facial laceration 08/28/2019  . Acid reflux 08/25/2019  . Abnormal laboratory test 08/25/2019  . Boil of buttock 06/09/2019  . Polysubstance abuse (HCC)   . Acute hepatic failure 10/25/2015  . Disseminated intravascular coagulation (HCC) 10/25/2015  . Acute liver failure 10/25/2015  . Hepatic injury 10/24/2015  . Alcohol use disorder, severe, dependence (HCC) 08/31/2015  . Alcohol withdrawal (HCC) 08/31/2015  . Alcohol abuse with alcohol-induced mood disorder (HCC) 08/31/2015  . Stimulant abuse (HCC) 08/31/2015    Past Surgical History:  Procedure Laterality Date  . arm surgery    . TUBAL LIGATION Bilateral     Prior to Admission medications   Medication Sig Start Date End Date Taking? Authorizing Provider  divalproex (DEPAKOTE) 500 MG DR tablet Take 500 mg by mouth 2 (two) times daily.    [provider]  ferrous sulfate 325 (65 FE) MG tablet Take 1 tablet (325 mg total) by mouth daily with breakfast. 05/22/19   Charm RingsLord, Jamison Y, NP   loratadine (CLARITIN) 10 MG tablet Take 1 tablet (10 mg total) by mouth daily. 09/29/19 11/01/19  Iloabachie, Chioma E, NP  naproxen (NAPROSYN) 500 MG tablet Take 1 tablet (500 mg total) by mouth 2 (two) times daily with a meal. 11/14/19   Bridget HartshornSummers, Velda Wendt L, PA-C  pantoprazole (PROTONIX) 40 MG tablet Take 1 tablet (40 mg total) by mouth daily. 08/25/19   Iloabachie, Chioma E, NP  sodium chloride (OCEAN) 0.65 % SOLN nasal spray Place 1 spray into both nostrils as needed for congestion. 09/27/19   Iloabachie, Chioma E, NP  traZODone (DESYREL) 50 MG tablet Take 50 mg by mouth at bedtime.    [provider]    Allergies Patient has no known allergies.  Family History  Problem Relation Age of Onset  . Breast cancer Maternal Aunt [id number]    Social History Social History   Tobacco Use  . Smoking status: Never Smoker  . Smokeless tobacco: Never Used  Substance Use Topics  . Alcohol use: Yes  . Drug use: Not Currently    Types: "Crack" cocaine, Benzodiazepines, Cocaine, Marijuana    Review of Systems Constitutional: No fever/chills Eyes: No visual changes. Cardiovascular: Denies chest pain. Respiratory: Denies shortness of breath. Gastrointestinal: No abdominal pain.  No nausea, no vomiting.  No diarrhea. Genitourinary: Negative for dysuria. Musculoskeletal: Positive for low back pain. Skin: Negative for rash. Neurological: Negative for headaches, focal weakness or numbness. Psychiatric:  History of bipolar disorder  ____________________________________________   PHYSICAL EXAM:  VITAL SIGNS: ED Triage Vitals  Enc Vitals Group     BP 11/14/19 0534 135/87     Pulse Rate 11/14/19 0534 76     Resp 11/14/19 0534 18     Temp --      Temp src --      SpO2 11/14/19 0534 98 %     Weight --      Height --      Head Circumference --      Peak Flow --      Pain Score 11/14/19 0535 10     Pain Loc --      Pain Edu? --      Excl. in GC? --    Constitutional: Alert and  oriented. Well appearing and in no acute distress.  Patient is lying on her left side completely covered in a blanket.  Patient refuses to sit up or move for examination.  She is also angry that I am a PA rather than a doctor.  No eye contact is made. Eyes: Conjunctivae are normal.  Head: Atraumatic. Neck: No stridor.   Cardiovascular: Normal rate, regular rhythm. Grossly normal heart sounds.  Good peripheral circulation. Respiratory: Normal respiratory effort.  No retractions. Lungs CTAB. Musculoskeletal: Exam was done with patient lying on her left side.  No gross deformity was noted.  There is diffuse tenderness to very light touch bilaterally and the thoracic and lumbar area.  No soft tissue edema or discoloration is noted.  No abrasions seen. Neurologic:  Normal speech and language. No gross focal neurologic deficits are appreciated.  Skin:  Skin is warm, dry and intact. No rash noted. Psychiatric: Mood and affect are normal. Speech and behavior are normal.  ____________________________________________   LABS (all labs ordered are listed, but only abnormal results are displayed)  Labs Reviewed  URINALYSIS, COMPLETE (UACMP) WITH MICROSCOPIC - Abnormal; Notable for the following components:      Result Value   Color, Urine YELLOW (*)    APPearance HAZY (*)    All other components within normal limits  POC URINE PREG, ED  POCT PREGNANCY, URINE   PROCEDURES  Procedure(s) performed (including Critical Care):  Procedures   ____________________________________________   INITIAL IMPRESSION / ASSESSMENT AND PLAN / ED COURSE  As part of my medical decision making, I reviewed the following data within the electronic MEDICAL RECORD NUMBER Notes from prior ED visits and [place] Controlled Substance Database  43 year old female presents with history of low back pain that started this morning without any history of injury.  History was difficult to obtain due to patient not being cooperative.   Patient frequently threw items on the floor.  She refused an injection of Toradol.  She was made aware that her urine did not show any signs of infection.  She did take a naproxen prior to being discharged.  At the time that the nurse delivered the discharge papers patient states that she wants to "harm herself and talk to a psychiatrist".  She states that her Child psychotherapist does not wake up until 10:00.  A prescription for naproxen was sent to her pharmacy.  After she reported that she was going to harm her self she was moved to the major ED hallway 19.  ____________________________________________   FINAL CLINICAL IMPRESSION(S) / ED DIAGNOSES  Final diagnoses:  Musculoskeletal pain  Bipolar disorder with depression [facility])     ED Discharge Orders  Ordered    naproxen (NAPROSYN) 500 MG tablet  2 times daily with meals     11/14/19 0818           Note:  This document was prepared using Dragon voice recognition software and may include unintentional dictation errors.    Johnn Hai, PA-C 11/14/19 1025    Vanessa [place], MD 11/14/19 1344

## 2019-11-14 NOTE — ED Notes (Signed)
Patient re-assessed by Dr. Nickolas Madrid. Patient discharged to home. No need to wait for psych consult.

## 2019-11-14 NOTE — Discharge Instructions (Signed)
Follow-up with your primary care provider if any continued problems.  You may use ice or heat to your back as needed for discomfort.  Begin taking naproxen 500 mg twice daily with food.  A prescription for the same was sent to your pharmacy.

## 2020-01-25 ENCOUNTER — Other Ambulatory Visit: Payer: Self-pay

## 2020-01-31 ENCOUNTER — Ambulatory Visit: Payer: Self-pay | Admitting: Gerontology

## 2020-02-01 ENCOUNTER — Other Ambulatory Visit: Payer: Self-pay

## 2020-02-01 DIAGNOSIS — K2100 Gastro-esophageal reflux disease with esophagitis, without bleeding: Secondary | ICD-10-CM

## 2020-02-01 MED ORDER — LORATADINE 10 MG PO TABS: 10.0000 mg | ORAL_TABLET | Freq: Every day | ORAL | 0 refills | Status: DC

## 2020-02-01 MED ORDER — PANTOPRAZOLE SODIUM 40 MG PO TBEC: 40.0000 mg | DELAYED_RELEASE_TABLET | Freq: Every day | ORAL | 0 refills | Status: DC

## 2020-02-06 ENCOUNTER — Telehealth: Payer: Self-pay | Admitting: Pharmacy Technician

## 2020-02-06 NOTE — Telephone Encounter (Signed)
Received 2021 proof of income.  Patient eligible to receive medication assistance at [facility] until time to re-certify in 2022, and as long as eligibility requirements continue to be met.  Kara Lawrence [name] Care Manager [facility] 

## 2020-02-07 ENCOUNTER — Ambulatory Visit: Payer: Self-pay | Admitting: Gerontology

## 2020-02-08 ENCOUNTER — Other Ambulatory Visit: Payer: Self-pay

## 2020-02-14 ENCOUNTER — Ambulatory Visit: Payer: Self-pay | Admitting: Gerontology

## 2020-02-15 ENCOUNTER — Other Ambulatory Visit: Payer: Self-pay

## 2020-02-15 DIAGNOSIS — R899 Unspecified abnormal finding in specimens from other organs, systems and tissues: Secondary | ICD-10-CM

## 2020-02-15 NOTE — Progress Notes (Signed)
hepa

## 2020-02-16 LAB — COMPREHENSIVE METABOLIC PANEL
ALT: 30 IU/L (ref 0–32)
AST: 53 IU/L — ABNORMAL HIGH (ref 0–40)
Albumin/Globulin Ratio: 0.9 — ABNORMAL LOW (ref 1.2–2.2)
Albumin: 3.7 g/dL — ABNORMAL LOW (ref 3.8–4.8)
Alkaline Phosphatase: 93 IU/L (ref 39–117)
BUN/Creatinine Ratio: 12 (ref 9–23)
BUN: 7 mg/dL (ref 6–24)
Bilirubin Total: 0.3 mg/dL (ref 0.0–1.2)
CO2: 20 mmol/L (ref 20–29)
Calcium: 8.9 mg/dL (ref 8.7–10.2)
Chloride: 103 mmol/L (ref 96–106)
Creatinine, Ser: 0.59 mg/dL (ref 0.57–1.00)
GFR calc Af Amer: 128 mL/min/{1.73_m2} (ref >59)
GFR calc non Af Amer: 111 mL/min/{1.73_m2} (ref >59)
Globulin, Total: 4.1 g/dL (ref 1.5–4.5)
Glucose: 85 mg/dL (ref 65–99)
Potassium: 3.5 mmol/L (ref 3.5–5.2)
Sodium: 138 mmol/L (ref 134–144)
Total Protein: 7.8 g/dL (ref 6.0–8.5)

## 2020-02-16 LAB — CBC WITH DIFFERENTIAL/PLATELET
Basophils Absolute: 0 10*3/uL (ref 0.0–0.2)
Basos: 0 %
EOS (ABSOLUTE): 0 10*3/uL (ref 0.0–0.4)
Eos: 1 %
Hematocrit: 26.7 % — ABNORMAL LOW (ref 34.0–46.6)
Hemoglobin: 8 g/dL — ABNORMAL LOW (ref 11.1–15.9)
Immature Grans (Abs): 0 10*3/uL (ref 0.0–0.1)
Immature Granulocytes: 0 %
Lymphocytes Absolute: 1.5 10*3/uL (ref 0.7–3.1)
Lymphs: 28 %
MCH: 18.5 pg — ABNORMAL LOW (ref 26.6–33.0)
MCHC: 30 g/dL — ABNORMAL LOW (ref 31.5–35.7)
MCV: 62 fL — ABNORMAL LOW (ref 79–97)
Monocytes Absolute: 0.7 10*3/uL (ref 0.1–0.9)
Monocytes: 13 %
Neutrophils Absolute: 3.1 10*3/uL (ref 1.4–7.0)
Neutrophils: 58 %
Platelets: 348 10*3/uL (ref 150–450)
RBC: 4.32 x10E6/uL (ref 3.77–5.28)
RDW: 19.8 % — ABNORMAL HIGH (ref 11.7–15.4)
WBC: 5.3 10*3/uL (ref 3.4–10.8)

## 2020-02-16 LAB — HEPATITIS C ANTIBODY: Hep C Virus Ab: >11 s/co ratio — ABNORMAL HIGH (ref 0.0–0.9)

## 2020-02-21 ENCOUNTER — Ambulatory Visit: Payer: Self-pay | Admitting: Gerontology

## 2020-02-21 ENCOUNTER — Other Ambulatory Visit: Payer: Self-pay

## 2020-02-21 DIAGNOSIS — R768 Other specified abnormal immunological findings in serum: Secondary | ICD-10-CM | POA: Insufficient documentation

## 2020-02-21 DIAGNOSIS — Z889 Allergy status to unspecified drugs, medicaments and biological substances status: Secondary | ICD-10-CM

## 2020-02-21 DIAGNOSIS — K2100 Gastro-esophageal reflux disease with esophagitis, without bleeding: Secondary | ICD-10-CM

## 2020-02-21 MED ORDER — LORATADINE 10 MG PO TABS: 10.0000 mg | ORAL_TABLET | Freq: Every day | ORAL | 3 refills | Status: DC

## 2020-02-21 MED ORDER — PANTOPRAZOLE SODIUM 40 MG PO TBEC: 40.0000 mg | DELAYED_RELEASE_TABLET | Freq: Every day | ORAL | 3 refills | Status: DC

## 2020-02-21 NOTE — Progress Notes (Signed)
Established Patient Office Visit  Subjective:  Patient ID: Kara Lawrence, female    DOB: [birth year]  Age: 44 y.o. MRN: [id number]  CC:  Chief Complaint  Patient presents with  . Gastroesophageal Reflux  Patient consents to telephone visit and 2 patient identifiers was used to identify patient.  HPI  Kara Lawrence presents for follow up of Jerrye Bushy and medication refill. She states that her acid reflux is under control with taking Protonix 40 mg daily. Her AST done on 02/15/2020 decreased from 98 to 53. She reports that she has not had any alcoholic beverage in the past 2 weeks. Her hepatitis C Ab was positive, she denies abdominal pain. She had vaginal hysterectomy done at [facility] on 01/12/2020.She denies chest pain, light headedness, fever and chills, states that her mood is good, denies suicidal nor homicidal ideation. Overall, she states that she's doing well and offers no further complaint.  Past Medical History:  Diagnosis Date  . Allergy   . Bipolar 1 disorder ([place])   . Depression   . Manic depression ([place])    Dr Randel Books, psychiatrist  . Seizure [facility])     Past Surgical History:  Procedure Laterality Date  . arm surgery    . TUBAL LIGATION Bilateral     Family History  Problem Relation Age of Onset  . Breast cancer Maternal Aunt 67    Social History   Socioeconomic History  . Marital status: Single    Spouse name: Not on file  . Number of children: 5  . Years of education: 8  . Highest education level: 8th grade  Occupational History  . Not on file  Tobacco Use  . Smoking status: Never Smoker  . Smokeless tobacco: Never Used  Substance and Sexual Activity  . Alcohol use: Yes  . Drug use: Not Currently    Types: "Crack" cocaine, Benzodiazepines, Cocaine, Marijuana  . Sexual activity: Not on file  Other Topics Concern  . Not on file  Social History Narrative   Lives in boarding room with boyfriend   Social Determinants of Health   Financial  Resource Strain:   . Difficulty of Paying Living Expenses: Not on file  Food Insecurity:   . Worried About Charity fundraiser in the Last Year: Not on file  . Ran Out of Food in the Last Year: Not on file  Transportation Needs:   . Lack of Transportation (Medical): Not on file  . Lack of Transportation (Non-Medical): Not on file  Physical Activity:   . Days of Exercise per Week: Not on file  . Minutes of Exercise per Session: Not on file  Stress:   . Feeling of Stress : Not on file  Social Connections:   . Frequency of Communication with Friends and Family: Not on file  . Frequency of Social Gatherings with Friends and Family: Not on file  . Attends Religious Services: Not on file  . Active Member of Clubs or Organizations: Not on file  . Attends Archivist Meetings: Not on file  . Marital Status: Not on file  Intimate Partner Violence:   . Fear of Current or Ex-Partner: Not on file  . Emotionally Abused: Not on file  . Physically Abused: Not on file  . Sexually Abused: Not on file    Outpatient Medications Prior to Visit  Medication Sig Dispense Refill  . divalproex (DEPAKOTE) 500 MG DR tablet Take 500 mg by mouth 2 (two) times daily.    Marland Kitchen  traZODone (DESYREL) 50 MG tablet Take 50 mg by mouth at bedtime.    Marland Kitchen loratadine (CLARITIN) 10 MG tablet Take 1 tablet (10 mg total) by mouth daily. 30 tablet 0  . pantoprazole (PROTONIX) 40 MG tablet Take 1 tablet (40 mg total) by mouth daily. 30 tablet 0  . sodium chloride (OCEAN) 0.65 % SOLN nasal spray Place 1 spray into both nostrils as needed for congestion. 15 mL 0  . ferrous sulfate 325 (65 FE) MG tablet Take 1 tablet (325 mg total) by mouth daily with breakfast. (Patient not taking: Reported on 02/21/2020) 30 tablet 3  . naproxen (NAPROSYN) 500 MG tablet Take 1 tablet (500 mg total) by mouth 2 (two) times daily with a meal. (Patient not taking: Reported on 02/21/2020) 14 tablet 0   No facility-administered medications prior to  visit.    No Known Allergies  ROS Review of Systems  Constitutional: Negative.   Respiratory: Negative.   Cardiovascular: Negative.   Gastrointestinal: Negative.   Neurological: Negative.   Psychiatric/Behavioral: Negative.       Objective:    Physical Exam No physical exam was done There were no vitals taken for this visit. Wt Readings from Last 3 Encounters:  02/15/20 121 lb 14.4 oz (55.3 kg)  11/01/19 131 lb (59.4 kg)  09/27/19 132 lb (59.9 kg)     Health Maintenance Due  Topic Date Due  . HIV Screening  06/26/1989    There are no preventive care reminders to display for this patient.  Lab Results  Component Value Date   TSH 3.190 02/23/2019   Lab Results  Component Value Date   WBC 5.3 02/15/2020   HGB 8.0 (L) 02/15/2020   HCT 26.7 (L) 02/15/2020   MCV 62 (L) 02/15/2020   PLT 348 02/15/2020   Lab Results  Component Value Date   NA 138 02/15/2020   K 3.5 02/15/2020   CO2 20 02/15/2020   GLUCOSE 85 02/15/2020   BUN 7 02/15/2020   CREATININE 0.59 02/15/2020   BILITOT 0.3 02/15/2020   ALKPHOS 93 02/15/2020   AST 53 (H) 02/15/2020   ALT 30 02/15/2020   PROT 7.8 02/15/2020   ALBUMIN 3.7 (L) 02/15/2020   CALCIUM 8.9 02/15/2020   ANIONGAP 23 (H) 07/19/2019   Lab Results  Component Value Date   CHOL 135 06/01/2019   Lab Results  Component Value Date   HDL 63 06/01/2019   Lab Results  Component Value Date   LDLCALC 61 06/01/2019   Lab Results  Component Value Date   TRIG 53 06/01/2019   Lab Results  Component Value Date   CHOLHDL 2.1 06/01/2019   Lab Results  Component Value Date   HGBA1C 4.9 02/23/2019      Assessment & Plan:    1. Gastroesophageal reflux disease with esophagitis - Her acid reflux is under control and she will continue on current treatment regimen. -She was advised to Avoid spicy, fatty and fried food -Avoid sodas and sour juices -Avoid heavy meals -Avoid eating 4 hours before bedtime -Elevate head of bed  at night - pantoprazole (PROTONIX) 40 MG tablet; Take 1 tablet (40 mg total) by mouth daily.  Dispense: 30 tablet; Refill: 3  2. Hepatitis C antibody test positive - She was advised to complete [facility] charity care application for  - Ambulatory referral to Gastroenterology for evaluation of Hep C.  3. History of allergy - Her allergy is under control and she will continue on current medication. - loratadine (  CLARITIN) 10 MG tablet; Take 1 tablet (10 mg total) by mouth daily.  Dispense: 30 tablet; Refill: 3    Follow-up: Return in about 3 months (around 05/23/2020), or if symptoms worsen or fail to improve.    Armilda Vanderlinden Trellis Paganini, NP

## 2020-02-21 NOTE — Patient Instructions (Signed)
Food Choices for Gastroesophageal Reflux Disease, Adult When you have gastroesophageal reflux disease (GERD), the foods you eat and your eating habits are very important. Choosing the right foods can help ease the discomfort of GERD. Consider working with a diet and nutrition specialist (dietitian) to help you make healthy food choices. What general guidelines should I follow?  Eating plan  Choose healthy foods low in fat, such as fruits, vegetables, whole grains, low-fat dairy products, and lean meat, fish, and poultry.  Eat frequent, small meals instead of three large meals each day. Eat your meals slowly, in a relaxed setting. Avoid bending over or lying down until 2-3 hours after eating.  Limit high-fat foods such as fatty meats or fried foods.  Limit your intake of oils, butter, and shortening to less than 8 teaspoons each day.  Avoid the following: ? Foods that cause symptoms. These may be different for different people. Keep a food diary to keep track of foods that cause symptoms. ? Alcohol. ? Drinking large amounts of liquid with meals. ? Eating meals during the 2-3 hours before bed.  Cook foods using methods other than frying. This may include baking, grilling, or broiling. Lifestyle  Maintain a healthy weight. Ask your health care provider what weight is healthy for you. If you need to lose weight, work with your health care provider to do so safely.  Exercise for at least 30 minutes on 5 or more days each week, or as told by your health care provider.  Avoid wearing clothes that fit tightly around your waist and chest.  Do not use any products that contain nicotine or tobacco, such as cigarettes and e-cigarettes. If you need help quitting, ask your health care provider.  Sleep with the head of your bed raised. Use a wedge under the mattress or blocks under the bed frame to raise the head of the bed. What foods are not recommended? The items listed may not be a complete  list. Talk with your dietitian about what dietary choices are best for you. Grains Pastries or quick breads with added fat. French toast. Vegetables Deep fried vegetables. French fries. Any vegetables prepared with added fat. Any vegetables that cause symptoms. For some people this may include tomatoes and tomato products, chili peppers, onions and garlic, and horseradish. Fruits Any fruits prepared with added fat. Any fruits that cause symptoms. For some people this may include citrus fruits, such as oranges, grapefruit, pineapple, and lemons. Meats and other protein foods High-fat meats, such as fatty beef or pork, hot dogs, ribs, ham, sausage, salami and bacon. Fried meat or protein, including fried fish and fried chicken. Nuts and nut butters. Dairy Whole milk and chocolate milk. Sour cream. Cream. Ice cream. Cream cheese. Milk shakes. Beverages Coffee and tea, with or without caffeine. Carbonated beverages. Sodas. Energy drinks. Fruit juice made with acidic fruits (such as orange or grapefruit). Tomato juice. Alcoholic drinks. Fats and oils Butter. Margarine. Shortening. Ghee. Sweets and desserts Chocolate and cocoa. Donuts. Seasoning and other foods Pepper. Peppermint and spearmint. Any condiments, herbs, or seasonings that cause symptoms. For some people, this may include curry, hot sauce, or vinegar-based salad dressings. Summary  When you have gastroesophageal reflux disease (GERD), food and lifestyle choices are very important to help ease the discomfort of GERD.  Eat frequent, small meals instead of three large meals each day. Eat your meals slowly, in a relaxed setting. Avoid bending over or lying down until 2-3 hours after eating.  Limit high-fat   foods such as fatty meat or fried foods. This information is not intended to replace advice given to you by your health care provider. Make sure you discuss any questions you have with your health care provider. Document Revised:  03/24/2019 Document Reviewed: 12/02/2016 Elsevier Patient Education  2020 Elsevier Inc.  

## 2020-04-24 ENCOUNTER — Other Ambulatory Visit: Payer: Self-pay

## 2020-04-24 ENCOUNTER — Ambulatory Visit (INDEPENDENT_AMBULATORY_CARE_PROVIDER_SITE_OTHER): Payer: Self-pay | Admitting: Gastroenterology

## 2020-04-24 ENCOUNTER — Encounter: Payer: Self-pay | Admitting: Gastroenterology

## 2020-04-24 VITALS — BP 127/81 | HR 84 | Temp 98.4°F | Ht 63.0 in | Wt 123.6 lb

## 2020-04-24 DIAGNOSIS — Z87891 Personal history of nicotine dependence: Secondary | ICD-10-CM | POA: Insufficient documentation

## 2020-04-24 DIAGNOSIS — B182 Chronic viral hepatitis C: Secondary | ICD-10-CM

## 2020-04-24 NOTE — Progress Notes (Signed)
Gastroenterology Consultation  Referring Provider:     Langston Reusing, NP Primary Care Physician:  Langston Reusing, NP Primary Gastroenterologist:  Dr. Allen Norris     Reason for Consultation:     Hepatitis C and anemia        HPI:    Kara Lawrence is a 44 y.o. y/o female referred for consultation & management of hepatitis C and anemia by Dr. Hattie Perch, Lonny Prude, NP.  This patient was sent in for evaluation for hepatitis C antibody positive with anemia.  The patient also had abnormal liver enzymes.    The patient was asked the history but would not offer any information.  She was asked if she had any risk factors including any tattoos, transfusions or high risk activities.  The patient reported no to all of these.  She also states that she had a hysterectomy and has not noted any GI bleeding or black stools.  The patient also denied having any previous GI evaluation.  Past Medical History:  Diagnosis Date  . Allergy   . Bipolar 1 disorder ([place])   . Depression   . Manic depression ([place])    Dr Randel Books, psychiatrist  . Seizure [facility])     Past Surgical History:  Procedure Laterality Date  . arm surgery    . TUBAL LIGATION Bilateral     Prior to Admission medications   Medication Sig Start Date End Date Taking? Authorizing Provider  divalproex (DEPAKOTE) 500 MG DR tablet Take 500 mg by mouth 2 (two) times daily.    [provider]  ferrous sulfate 325 (65 FE) MG tablet Take by mouth. 07/22/19 07/21/20  [provider]  loratadine (CLARITIN) 10 MG tablet Take 1 tablet (10 mg total) by mouth daily. 02/21/20 03/22/20  Iloabachie, Chioma E, NP  pantoprazole (PROTONIX) 40 MG tablet Take 1 tablet (40 mg total) by mouth daily. 02/21/20   Iloabachie, Chioma E, NP  sodium chloride (OCEAN) 0.65 % SOLN nasal spray Place 1 spray into both nostrils as needed for congestion. 09/27/19   Iloabachie, Chioma E, NP  traMADol (ULTRAM) 50 MG tablet Take 1 tablet nightly for pain.  02/21/20   [provider]  traZODone (DESYREL) 50 MG tablet Take 50 mg by mouth at bedtime.    [provider]    Family History  Problem Relation Age of Onset  . Breast cancer Maternal Aunt 67     Social History   Tobacco Use  . Smoking status: Never Smoker  . Smokeless tobacco: Never Used  Substance Use Topics  . Alcohol use: Yes  . Drug use: Not Currently    Types: "Crack" cocaine, Benzodiazepines, Cocaine, Marijuana    Allergies as of 04/24/2020  . (No Known Allergies)    Review of Systems:    All systems reviewed and negative except where noted in HPI.   Physical Exam:  BP 127/81   Pulse 84   Temp 98.4 F (36.9 C) (Oral)   Ht 5\' 3"  (1.6 m)   Wt 123 lb 9.6 oz (56.1 kg)   BMI 21.89 kg/m  No LMP recorded. General:   Alert,  Well-developed, well-nourished, appears angry and noncooperative  Patient walked out of the room prior to any exam  Imaging Studies: No results found.  Assessment and Plan:    Kara Lawrence is a 44 y.o. y/o female who was sent to me for evaluation of hepatitis C and anemia.  The patient was asked screening questions for risk  factors and denied having any risk factors.  The patient was told that the treatment for hepatitis C is very effective.  The patient appeared angry to the staff and myself upon meeting her.  She also had been 45 minutes late to the appointment and despite being told that that was not a problem with still quite agitated.  I explained to her that the treatments for her hepatitis C had very little side effects and had a very high cure rate close to 100%.  I asked her if she would be willing to undergo treatment and possible evaluation for her anemia.  I reiterated multiple times that I was here to help her in any way I could and to try and get rid of the virus so that it would cause no long-term effect.   The patient stated that she does not want to be treated or evaluated and wanted to know if she could  leave now.  I told her that I would request that she please take my card so that if she changes her mind we would always be available to her for treatment and evaluation of her anemia if she so wished.  Despite this she walked out of the office and left the building without even checking out.    Midge Minium, MD. Clementeen Graham    Note: This dictation was prepared with Dragon dictation along with smaller phrase technology. Any transcriptional errors that result from this process are unintentional.

## 2020-05-23 ENCOUNTER — Ambulatory Visit: Payer: Self-pay | Admitting: Gerontology

## 2020-05-24 ENCOUNTER — Other Ambulatory Visit: Payer: Self-pay | Admitting: Gerontology

## 2020-05-24 DIAGNOSIS — K2100 Gastro-esophageal reflux disease with esophagitis, without bleeding: Secondary | ICD-10-CM

## 2020-05-24 DIAGNOSIS — Z889 Allergy status to unspecified drugs, medicaments and biological substances status: Secondary | ICD-10-CM

## 2020-05-31 ENCOUNTER — Other Ambulatory Visit: Payer: Self-pay

## 2020-05-31 ENCOUNTER — Ambulatory Visit: Payer: Self-pay | Admitting: Gerontology

## 2020-05-31 ENCOUNTER — Encounter: Payer: Self-pay | Admitting: Gerontology

## 2020-05-31 VITALS — BP 105/75 | HR 114 | Ht 63.0 in | Wt 117.7 lb

## 2020-05-31 DIAGNOSIS — R112 Nausea with vomiting, unspecified: Secondary | ICD-10-CM

## 2020-05-31 DIAGNOSIS — R42 Dizziness and giddiness: Secondary | ICD-10-CM

## 2020-05-31 DIAGNOSIS — K219 Gastro-esophageal reflux disease without esophagitis: Secondary | ICD-10-CM

## 2020-05-31 NOTE — Progress Notes (Deleted)
Established Patient Office Visit  Subjective:  Patient ID: Kara Lawrence, female    DOB: 29-May-1976  Age: 44 y.o. MRN: [id number]  CC: No chief complaint on file.   HPI  Kara Lawrence is a 44 y.o.female who presents to the clinic today for follow up of GERD.   Past Medical History:  Diagnosis Date  . Allergy   . Bipolar 1 disorder ([place])   . Depression   . Manic depression ([place])    Dr Randel Books, psychiatrist  . Seizure [facility])     Past Surgical History:  Procedure Laterality Date  . arm surgery    . TUBAL LIGATION Bilateral     Family History  Problem Relation Age of Onset  . Breast cancer Maternal Aunt 67    Social History   Socioeconomic History  . Marital status: Single    Spouse name: Not on file  . Number of children: 5  . Years of education: 8  . Highest education level: 8th grade  Occupational History  . Not on file  Tobacco Use  . Smoking status: Never Smoker  . Smokeless tobacco: Never Used  Vaping Use  . Vaping Use: Never used  Substance and Sexual Activity  . Alcohol use: Yes  . Drug use: Not Currently    Types: "Crack" cocaine, Benzodiazepines, Cocaine, Marijuana  . Sexual activity: Not on file  Other Topics Concern  . Not on file  Social History Narrative   Lives in boarding room with boyfriend   Social Determinants of Health   Financial Resource Strain:   . Difficulty of Paying Living Expenses:   Food Insecurity:   . Worried About Charity fundraiser in the Last Year:   . Arboriculturist in the Last Year:   Transportation Needs:   . Film/video editor (Medical):   Marland Kitchen Lack of Transportation (Non-Medical):   Physical Activity:   . Days of Exercise per Week:   . Minutes of Exercise per Session:   Stress:   . Feeling of Stress :   Social Connections:   . Frequency of Communication with Friends and Family:   . Frequency of Social Gatherings with Friends and Family:   . Attends Religious Services:   . Active  Member of Clubs or Organizations:   . Attends Archivist Meetings:   Marland Kitchen Marital Status:   Intimate Partner Violence:   . Fear of Current or Ex-Partner:   . Emotionally Abused:   Marland Kitchen Physically Abused:   . Sexually Abused:     Outpatient Medications Prior to Visit  Medication Sig Dispense Refill  . divalproex (DEPAKOTE) 500 MG DR tablet Take 500 mg by mouth 2 (two) times daily.    . ferrous sulfate 325 (65 FE) MG tablet Take by mouth.    . loratadine (CLARITIN) 10 MG tablet Take 1 tablet (10 mg total) by mouth daily. 30 tablet 3  . pantoprazole (PROTONIX) 40 MG tablet Take 1 tablet (40 mg total) by mouth daily. 30 tablet 3  . sodium chloride (OCEAN) 0.65 % SOLN nasal spray Place 1 spray into both nostrils as needed for congestion. 15 mL 0  . traMADol (ULTRAM) 50 MG tablet Take 1 tablet nightly for pain.    . traZODone (DESYREL) 50 MG tablet Take 50 mg by mouth at bedtime.     No facility-administered medications prior to visit.    No Known Allergies  ROS Review of Systems    Objective:  Physical Exam  There were no vitals taken for this visit. Wt Readings from Last 3 Encounters:  04/24/20 123 lb 9.6 oz (56.1 kg)  02/15/20 121 lb 14.4 oz (55.3 kg)  06/29/19 141 lb (64 kg)     Health Maintenance Due  Topic Date Due  . HIV Screening  Never done    There are no preventive care reminders to display for this patient.  Lab Results  Component Value Date   TSH 3.190 02/23/2019   Lab Results  Component Value Date   WBC 5.3 02/15/2020   HGB 8.0 (L) 02/15/2020   HCT 26.7 (L) 02/15/2020   MCV 62 (L) 02/15/2020   PLT 348 02/15/2020   Lab Results  Component Value Date   NA 138 02/15/2020   K 3.5 02/15/2020   CO2 20 02/15/2020   GLUCOSE 85 02/15/2020   BUN 7 02/15/2020   CREATININE 0.59 02/15/2020   BILITOT 0.3 02/15/2020   ALKPHOS 93 02/15/2020   AST 53 (H) 02/15/2020   ALT 30 02/15/2020   PROT 7.8 02/15/2020   ALBUMIN 3.7 (L) 02/15/2020   CALCIUM  8.9 02/15/2020   ANIONGAP 23 (H) 07/19/2019   Lab Results  Component Value Date   CHOL 135 06/01/2019   Lab Results  Component Value Date   HDL 63 06/01/2019   Lab Results  Component Value Date   LDLCALC 61 06/01/2019   Lab Results  Component Value Date   TRIG 53 06/01/2019   Lab Results  Component Value Date   CHOLHDL 2.1 06/01/2019   Lab Results  Component Value Date   HGBA1C 4.9 02/23/2019      Assessment & Plan:   Problem List Items Addressed This Visit    None      No orders of the defined types were placed in this encounter.   Follow-up: No follow-ups on file.    Onnie Graham, RN

## 2020-05-31 NOTE — Progress Notes (Signed)
Established Patient Office Visit  Subjective:  Patient ID: Kara Lawrence, female    DOB: 23-Sep-1976  Age: 44 y.o. MRN: [id number]  CC: No chief complaint on file.   HPI  Kara Lawrence presents for medication refill and states that " I am not feeling good and doesn't know why". She reports that "I have been feeling dizzy and feels like passing out". She also states that she has not been eating well. She also c/o fatigue, abdominal pain,nausea, vomiting and diarrhea and the aforementioned symptoms has been going on for 3 days. She states that she has not had alcoholic beverage in 3 days, and denies any withdrawal symptoms. She denies chest pain, fever, chills, and admits that she needs to go home and rest.   Past Medical History:  Diagnosis Date  . Allergy   . Bipolar 1 disorder ([facility])   . Depression   . Manic depression ([facility])    Dr Suzie Portela, psychiatrist  . Seizure [facility])     Past Surgical History:  Procedure Laterality Date  . arm surgery    . TUBAL LIGATION Bilateral     Family History  Problem Relation Age of Onset  . Breast cancer Maternal Aunt 66    Social History   Socioeconomic History  . Marital status: Single    Spouse name: Not on file  . Number of children: 5  . Years of education: 8  . Highest education level: 8th grade  Occupational History  . Not on file  Tobacco Use  . Smoking status: Never Smoker  . Smokeless tobacco: Never Used  Vaping Use  . Vaping Use: Never used  Substance and Sexual Activity  . Alcohol use: Yes  . Drug use: Not Currently    Types: "Crack" cocaine, Benzodiazepines, Cocaine, Marijuana  . Sexual activity: Not on file  Other Topics Concern  . Not on file  Social History Narrative   Lives in boarding room with boyfriend   Social Determinants of Health   Financial Resource Strain:   . Difficulty of Paying Living Expenses:   Food Insecurity:   . Worried About Programme researcher, broadcasting/film/video in the Last Year:   . Occupational psychologist in the Last Year:   Transportation Needs:   . Freight forwarder (Medical):   Marland Kitchen Lack of Transportation (Non-Medical):   Physical Activity:   . Days of Exercise per Week:   . Minutes of Exercise per Session:   Stress:   . Feeling of Stress :   Social Connections:   . Frequency of Communication with Friends and Family:   . Frequency of Social Gatherings with Friends and Family:   . Attends Religious Services:   . Active Member of Clubs or Organizations:   . Attends Banker Meetings:   Marland Kitchen Marital Status:   Intimate Partner Violence:   . Fear of Current or Ex-Partner:   . Emotionally Abused:   Marland Kitchen Physically Abused:   . Sexually Abused:     Outpatient Medications Prior to Visit  Medication Sig Dispense Refill  . divalproex (DEPAKOTE) 500 MG DR tablet Take 500 mg by mouth 2 (two) times daily.    . ferrous sulfate 325 (65 FE) MG tablet Take by mouth.    . loratadine (CLARITIN) 10 MG tablet Take 1 tablet (10 mg total) by mouth daily. 30 tablet 3  . pantoprazole (PROTONIX) 40 MG tablet Take 1 tablet (40 mg total) by mouth daily. 30 tablet 3  .  sodium chloride (OCEAN) 0.65 % SOLN nasal spray Place 1 spray into both nostrils as needed for congestion. 15 mL 0  . traMADol (ULTRAM) 50 MG tablet Take 1 tablet nightly for pain.    . traZODone (DESYREL) 50 MG tablet Take 50 mg by mouth at bedtime.     No facility-administered medications prior to visit.    No Known Allergies  ROS Review of Systems  Constitutional: Positive for fatigue. Negative for chills and fever.  Respiratory: Negative.   Cardiovascular: Negative.   Gastrointestinal: Positive for abdominal pain, diarrhea and vomiting.  Genitourinary: Negative.   Neurological: Positive for dizziness.  Psychiatric/Behavioral: The patient is nervous/anxious.       Objective:    Physical Exam Constitutional:      Appearance: Normal appearance.  HENT:     Head: Normocephalic and atraumatic.  Eyes:      Extraocular Movements: Extraocular movements intact.     Pupils: Pupils are equal, round, and reactive to light.  Cardiovascular:     Rate and Rhythm: Tachycardia present.     Pulses: Normal pulses.     Heart sounds: Normal heart sounds.  Pulmonary:     Effort: Pulmonary effort is normal.     Breath sounds: Normal breath sounds.  Neurological:     Mental Status: She is alert.     BP 105/75 (BP Location: Left Arm, Patient Position: Sitting, Cuff Size: Normal)   Pulse (!) 114   Ht 5\' 3"  (1.6 m)   Wt 117 lb 11.2 oz (53.4 kg)   SpO2 98%   BMI 20.85 kg/m  Wt Readings from Last 3 Encounters:  05/31/20 117 lb 11.2 oz (53.4 kg)  04/24/20 123 lb 9.6 oz (56.1 kg)  02/15/20 121 lb 14.4 oz (55.3 kg)     Health Maintenance Due  Topic Date Due  . HIV Screening  Never done    There are no preventive care reminders to display for this patient.  Lab Results  Component Value Date   TSH 3.190 02/23/2019   Lab Results  Component Value Date   WBC 5.3 02/15/2020   HGB 8.0 (L) 02/15/2020   HCT 26.7 (L) 02/15/2020   MCV 62 (L) 02/15/2020   PLT 348 02/15/2020   Lab Results  Component Value Date   NA 138 02/15/2020   K 3.5 02/15/2020   CO2 20 02/15/2020   GLUCOSE 85 02/15/2020   BUN 7 02/15/2020   CREATININE 0.59 02/15/2020   BILITOT 0.3 02/15/2020   ALKPHOS 93 02/15/2020   AST 53 (H) 02/15/2020   ALT 30 02/15/2020   PROT 7.8 02/15/2020   ALBUMIN 3.7 (L) 02/15/2020   CALCIUM 8.9 02/15/2020   ANIONGAP 23 (H) 07/19/2019   Lab Results  Component Value Date   CHOL 135 06/01/2019   Lab Results  Component Value Date   HDL 63 06/01/2019   Lab Results  Component Value Date   LDLCALC 61 06/01/2019   Lab Results  Component Value Date   TRIG 53 06/01/2019   Lab Results  Component Value Date   CHOLHDL 2.1 06/01/2019   Lab Results  Component Value Date   HGBA1C 4.9 02/23/2019      Assessment & Plan:     1. Gastroesophageal reflux disease without esophagitis -  Her acid reflux is under control and she will continue on current treatment regimen. -Avoid spicy, fatty and fried food -Avoid sodas and sour juices -Avoid heavy meals -Avoid eating 4 hours before bedtime -Elevate head of  bed at night   2. Nausea and vomiting, intractability of vomiting not specified, unspecified vomiting type - Unable to check her Lab at the clinic and was advised to go to the ED. She declines EMS and states that she will go home and sleep.  3. Dizziness -Unable to check her Lab at the clinic and was advised to go to the ED. She declines EMS  and states that she will go home and sleep.    Follow-up: Return in about 20 days (around 06/20/2020), or if symptoms worsen or fail to improve.    Lunden Mcleish Trellis Paganini, NP

## 2020-06-20 ENCOUNTER — Encounter: Payer: Self-pay | Admitting: Gerontology

## 2020-06-20 ENCOUNTER — Other Ambulatory Visit: Payer: Self-pay

## 2020-06-20 ENCOUNTER — Other Ambulatory Visit: Payer: Self-pay | Admitting: Gerontology

## 2020-06-20 ENCOUNTER — Ambulatory Visit: Payer: Self-pay | Admitting: Gerontology

## 2020-06-20 VITALS — BP 118/84 | HR 92 | Resp 16 | Ht 63.0 in | Wt 126.0 lb

## 2020-06-20 DIAGNOSIS — D509 Iron deficiency anemia, unspecified: Secondary | ICD-10-CM

## 2020-06-20 DIAGNOSIS — R0602 Shortness of breath: Secondary | ICD-10-CM | POA: Insufficient documentation

## 2020-06-20 DIAGNOSIS — K2100 Gastro-esophageal reflux disease with esophagitis, without bleeding: Secondary | ICD-10-CM | POA: Insufficient documentation

## 2020-06-20 MED ORDER — FERROUS SULFATE 325 (65 FE) MG PO TABS: 325.0000 mg | ORAL_TABLET | Freq: Every day | ORAL | 5 refills | Status: DC

## 2020-06-20 MED ORDER — ALBUTEROL SULFATE HFA 108 (90 BASE) MCG/ACT IN AERS: 1.0000 | INHALATION_SPRAY | Freq: Four times a day (QID) | RESPIRATORY_TRACT | 0 refills | Status: DC | PRN

## 2020-06-20 NOTE — Patient Instructions (Signed)
Shortness of Breath, Adult Shortness of breath means you have trouble breathing. Shortness of breath could be a sign of a medical problem. Follow these instructions at home:   Watch for any changes in your symptoms.  Do not use any products that contain nicotine or tobacco, such as cigarettes, e-cigarettes, and chewing tobacco.  Do not smoke. Smoking can cause shortness of breath. If you need help to quit smoking, ask your doctor.  Avoid things that can make it harder to breathe, such as: ? Mold. ? Dust. ? Air pollution. ? Chemical smells. ? Things that can cause allergy symptoms (allergens), if you have allergies.  Keep your living space clean. Use products that help remove mold and dust.  Rest as needed. Slowly return to your normal activities.  Take over-the-counter and prescription medicines only as told by your doctor. This includes oxygen therapy and inhaled medicines.  Keep all follow-up visits as told by your doctor. This is important. Contact a doctor if:  Your condition does not get better as soon as expected.  You have a hard time doing your normal activities, even after you rest.  You have new symptoms. Get help right away if:  Your shortness of breath gets worse.  You have trouble breathing when you are resting.  You feel light-headed or you pass out (faint).  You have a cough that is not helped by medicines.  You cough up blood.  You have pain with breathing.  You have pain in your chest, arms, shoulders, or belly (abdomen).  You have a fever.  You cannot walk up stairs.  You cannot exercise the way you normally do. These symptoms may represent a serious problem that is an emergency. Do not wait to see if the symptoms will go away. Get medical help right away. Call your local emergency services (911 in the [place] Do not drive yourself to the hospital. Summary  Shortness of breath is when you have trouble breathing enough air. It can be a sign of a  medical problem.  Avoid things that make it hard for you to breathe, such as smoking, pollution, mold, and dust.  Watch for any changes in your symptoms. Contact your doctor if you do not get better or you get worse. This information is not intended to replace advice given to you by your health care provider. Make sure you discuss any questions you have with your health care provider. Document Revised: 05/03/2018 Document Reviewed: 05/03/2018 Elsevier Patient Education  2020 Elsevier Inc.  

## 2020-06-20 NOTE — Progress Notes (Signed)
Established Patient Office Visit  Subjective:  Patient ID: Kara Lawrence, female    DOB: 02/17/1976  Age: 44 y.o. MRN: [id number]  CC:  Chief Complaint  Patient presents with  . Shortness of Breath    1-2 months, worsening with humidity in the summer    HPI  Kara Lawrence presents for c/o shortness of breath that's been going on for 1-2 months and medication refill. She states that she has a history of shortness of breath which worsens with increased humidity in the summer. She states that walking outside in humid temperature aggravates symptoms. She denies shortness of breath at rest,cough, wheezing, chest pain, palpitation, dizziness nor light headedness and lower extremity swelling. Overall, she states that she's doing well and offers no further complaint.  Past Medical History:  Diagnosis Date  . Allergy   . Bipolar 1 disorder ([facility])   . Depression   . Manic depression ([facility])    Dr Suzie Portela, psychiatrist  . Seizure [facility])     Past Surgical History:  Procedure Laterality Date  . arm surgery    . TUBAL LIGATION Bilateral     Family History  Problem Relation Age of Onset  . Breast cancer Maternal Aunt 2    Social History   Socioeconomic History  . Marital status: Single    Spouse name: Not on file  . Number of children: 5  . Years of education: 8  . Highest education level: 8th grade  Occupational History  . Not on file  Tobacco Use  . Smoking status: Never Smoker  . Smokeless tobacco: Never Used  Vaping Use  . Vaping Use: Never used  Substance and Sexual Activity  . Alcohol use: Yes  . Drug use: Not Currently    Types: "Crack" cocaine, Benzodiazepines, Cocaine, Marijuana  . Sexual activity: Not on file  Other Topics Concern  . Not on file  Social History Narrative   Lives in boarding room with boyfriend   Social Determinants of Health   Financial Resource Strain:   . Difficulty of Paying Living Expenses:   Food Insecurity:   .  Worried About Programme researcher, broadcasting/film/video in the Last Year:   . Barista in the Last Year:   Transportation Needs:   . Freight forwarder (Medical):   Marland Kitchen Lack of Transportation (Non-Medical):   Physical Activity:   . Days of Exercise per Week:   . Minutes of Exercise per Session:   Stress:   . Feeling of Stress :   Social Connections:   . Frequency of Communication with Friends and Family:   . Frequency of Social Gatherings with Friends and Family:   . Attends Religious Services:   . Active Member of Clubs or Organizations:   . Attends Banker Meetings:   Marland Kitchen Marital Status:   Intimate Partner Violence:   . Fear of Current or Ex-Partner:   . Emotionally Abused:   Marland Kitchen Physically Abused:   . Sexually Abused:     Outpatient Medications Prior to Visit  Medication Sig Dispense Refill  . divalproex (DEPAKOTE) 500 MG DR tablet Take 500 mg by mouth 2 (two) times daily.    Marland Kitchen loratadine (CLARITIN) 10 MG tablet Take 1 tablet (10 mg total) by mouth daily. 30 tablet 3  . pantoprazole (PROTONIX) 40 MG tablet Take 1 tablet (40 mg total) by mouth daily. 30 tablet 3  . traZODone (DESYREL) 50 MG tablet Take 50 mg by mouth at bedtime.    Marland Kitchen  sodium chloride (OCEAN) 0.65 % SOLN nasal spray Place 1 spray into both nostrils as needed for congestion. 15 mL 0  . ferrous sulfate 325 (65 FE) MG tablet Take by mouth. (Patient not taking: Reported on 06/20/2020)    . traMADol (ULTRAM) 50 MG tablet Take 1 tablet nightly for pain. (Patient not taking: Reported on 06/20/2020)     No facility-administered medications prior to visit.    No Known Allergies  ROS Review of Systems  Constitutional: Negative.   Respiratory: Positive for shortness of breath. Negative for cough, chest tightness and wheezing.   Cardiovascular: Negative.   Neurological: Negative.   Psychiatric/Behavioral: Negative.       Objective:    Physical Exam Constitutional:      Appearance: She is well-developed.  HENT:      Head: Normocephalic and atraumatic.  Cardiovascular:     Rate and Rhythm: Normal rate and regular rhythm.  Pulmonary:     Effort: Pulmonary effort is normal.     Breath sounds: Normal breath sounds.  Neurological:     General: No focal deficit present.     Mental Status: She is alert and oriented to person, place, and time. Mental status is at baseline.  Psychiatric:        Mood and Affect: Mood normal.        Behavior: Behavior normal.        Thought Content: Thought content normal.        Judgment: Judgment normal.     BP 118/84 (BP Location: Right Arm, Patient Position: Sitting)   Pulse 92   Resp 16   Ht 5\' 3"  (1.6 m)   Wt 126 lb (57.2 kg)   SpO2 98%   BMI 22.32 kg/m  Wt Readings from Last 3 Encounters:  06/20/20 126 lb (57.2 kg)  05/31/20 117 lb 11.2 oz (53.4 kg)  04/24/20 123 lb 9.6 oz (56.1 kg)     Health Maintenance Due  Topic Date Due  . HIV Screening  Never done    There are no preventive care reminders to display for this patient.  Lab Results  Component Value Date   TSH 3.190 02/23/2019   Lab Results  Component Value Date   WBC 5.3 02/15/2020   HGB 8.0 (L) 02/15/2020   HCT 26.7 (L) 02/15/2020   MCV 62 (L) 02/15/2020   PLT 348 02/15/2020   Lab Results  Component Value Date   NA 138 02/15/2020   K 3.5 02/15/2020   CO2 20 02/15/2020   GLUCOSE 85 02/15/2020   BUN 7 02/15/2020   CREATININE 0.59 02/15/2020   BILITOT 0.3 02/15/2020   ALKPHOS 93 02/15/2020   AST 53 (H) 02/15/2020   ALT 30 02/15/2020   PROT 7.8 02/15/2020   ALBUMIN 3.7 (L) 02/15/2020   CALCIUM 8.9 02/15/2020   ANIONGAP 23 (H) 07/19/2019   Lab Results  Component Value Date   CHOL 135 06/01/2019   Lab Results  Component Value Date   HDL 63 06/01/2019   Lab Results  Component Value Date   LDLCALC 61 06/01/2019   Lab Results  Component Value Date   TRIG 53 06/01/2019   Lab Results  Component Value Date   CHOLHDL 2.1 06/01/2019   Lab Results  Component Value Date    HGBA1C 4.9 02/23/2019      Assessment & Plan:     1. SOB (shortness of breath) on exertion - Her SOB might be due to Blood clot? Anemia or COPD  giving history of smoking in the past. She declines lab draw for D dimer to rule out blood clot, and worsening Anemia since she has been out of her Ferrous sulfate for some weeks. -She declines going to the ED for evaluation. - albuterol (VENTOLIN HFA) 108 (90 Base) MCG/ACT inhaler; Inhale 1 puff into the lungs every 6 (six) hours as needed for wheezing or shortness of breath.  Dispense: 8 g; Refill: 0 - D-Dimer, Quantitative; Future -CBC w/Diff; Future - Iron Binding Cap (TIBC)([facility]); Future - She was advised to take frequent rest period while walking in hot  Humid weather, and to go to the ED with worsening symptoms.    3. Iron deficiency anemia, unspecified iron deficiency anemia type - She will continue on Ferrous sulfate and cbc will be rechecked. - ferrous sulfate 325 (65 FE) MG tablet; Take 1 tablet (325 mg total) by mouth daily with breakfast.  Dispense: 30 tablet; Refill: 5 - CBC w/Diff; Future - Iron Binding Cap (TIBC)([facility]); Future  3. Gastroesophageal reflux disease with esophagitis - She will continue on current treatment regimen. -Avoid spicy, fatty and fried food -Avoid sodas and sour juices -Avoid heavy meals -Avoid eating 4 hours before bedtime -Elevate head of bed at night      Follow-up: Return in about 15 days (around 07/05/2020), or if symptoms worsen or fail to improve.    Chelsa Stout Trellis Paganini, NP

## 2020-06-27 ENCOUNTER — Other Ambulatory Visit: Payer: Self-pay

## 2020-06-27 DIAGNOSIS — R0602 Shortness of breath: Secondary | ICD-10-CM

## 2020-06-27 DIAGNOSIS — D509 Iron deficiency anemia, unspecified: Secondary | ICD-10-CM

## 2020-06-28 ENCOUNTER — Telehealth: Payer: Self-pay | Admitting: Gerontology

## 2020-06-28 ENCOUNTER — Other Ambulatory Visit: Payer: Self-pay | Admitting: Gerontology

## 2020-06-28 DIAGNOSIS — D509 Iron deficiency anemia, unspecified: Secondary | ICD-10-CM

## 2020-06-28 LAB — CBC WITH DIFFERENTIAL/PLATELET
Basophils Absolute: 0 10*3/uL (ref 0.0–0.2)
Basos: 0 %
EOS (ABSOLUTE): 0 10*3/uL (ref 0.0–0.4)
Eos: 1 %
Hematocrit: 25.8 % — ABNORMAL LOW (ref 34.0–46.6)
Hemoglobin: 8 g/dL — ABNORMAL LOW (ref 11.1–15.9)
Immature Grans (Abs): 0 10*3/uL (ref 0.0–0.1)
Immature Granulocytes: 1 %
Lymphocytes Absolute: 1.7 10*3/uL (ref 0.7–3.1)
Lymphs: 44 %
MCH: 19 pg — ABNORMAL LOW (ref 26.6–33.0)
MCHC: 31 g/dL — ABNORMAL LOW (ref 31.5–35.7)
MCV: 61 fL — ABNORMAL LOW (ref 79–97)
Monocytes Absolute: 0.7 10*3/uL (ref 0.1–0.9)
Monocytes: 19 %
Neutrophils Absolute: 1.3 10*3/uL — ABNORMAL LOW (ref 1.4–7.0)
Neutrophils: 35 %
Platelets: 262 10*3/uL (ref 150–450)
RBC: 4.21 x10E6/uL (ref 3.77–5.28)
RDW: 22 % — ABNORMAL HIGH (ref 11.7–15.4)
WBC: 3.8 10*3/uL (ref 3.4–10.8)

## 2020-06-28 LAB — IRON AND TIBC
Iron Saturation: 4 % — CL (ref 15–55)
Iron: 21 ug/dL — ABNORMAL LOW (ref 27–159)
Total Iron Binding Capacity: 486 ug/dL — ABNORMAL HIGH (ref 250–450)
UIBC: 465 ug/dL — ABNORMAL HIGH (ref 131–425)

## 2020-06-28 LAB — D-DIMER, QUANTITATIVE: D-DIMER: 0.36 mg/L FEU (ref 0.00–0.49)

## 2020-06-28 NOTE — Telephone Encounter (Signed)
Patient has no VM set up, called for lab review no answer. Hematology referral was sent for low Iron saturation.

## 2020-07-03 ENCOUNTER — Other Ambulatory Visit: Payer: Self-pay

## 2020-07-03 DIAGNOSIS — K2100 Gastro-esophageal reflux disease with esophagitis, without bleeding: Secondary | ICD-10-CM

## 2020-07-03 DIAGNOSIS — Z889 Allergy status to unspecified drugs, medicaments and biological substances status: Secondary | ICD-10-CM

## 2020-07-03 MED ORDER — LORATADINE 10 MG PO TABS: 10.0000 mg | ORAL_TABLET | Freq: Every day | ORAL | 3 refills | Status: DC

## 2020-07-03 MED ORDER — PANTOPRAZOLE SODIUM 40 MG PO TBEC: 40.0000 mg | DELAYED_RELEASE_TABLET | Freq: Every day | ORAL | 3 refills | Status: DC

## 2020-07-05 ENCOUNTER — Ambulatory Visit: Payer: Self-pay | Admitting: Gerontology

## 2020-07-06 ENCOUNTER — Inpatient Hospital Stay: Payer: Self-pay

## 2020-07-06 ENCOUNTER — Inpatient Hospital Stay: Payer: Self-pay | Attending: Oncology | Admitting: Oncology

## 2020-07-12 ENCOUNTER — Ambulatory Visit: Payer: Self-pay | Admitting: Gerontology

## 2020-07-12 VITALS — BP 103/87 | HR 86 | Ht 63.0 in | Wt 125.0 lb

## 2020-07-12 DIAGNOSIS — Z87891 Personal history of nicotine dependence: Secondary | ICD-10-CM

## 2020-07-12 DIAGNOSIS — D509 Iron deficiency anemia, unspecified: Secondary | ICD-10-CM

## 2020-07-12 NOTE — Progress Notes (Signed)
Established Patient Office Visit  Subjective:  Patient ID: Kara Lawrence, female    DOB: 1976/08/20  Age: 44 y.o. MRN: [id number]  CC:  Chief Complaint  Patient presents with  . iron deficiency  . lab review    HPI  Kara Lawrence presents for follow up of Lab review. Her Iron studies done on 06/27/2020, Iron Saturation was less than 4%, Hematocrit was 25.8% and Hgb 8 g/dl. She continues to take 325 mg Ferrous sulfate daily, and she didn't not go for her Hematology appointment with Dr Smith Robert on 07/06/2020. She states that" I don't know what time to catch the bus that goes to the Hospital". She denies fatigue,light headedness, chest pain and palpitation. Overall, she states that she's doing well and offers no further complaint.  Past Medical History:  Diagnosis Date  . Allergy   . Bipolar 1 disorder (HCC)   . Depression   . Manic depression (HCC)    Dr Suzie Portela, psychiatrist  . Seizure [facility])     Past Surgical History:  Procedure Laterality Date  . arm surgery    . TUBAL LIGATION Bilateral     Family History  Problem Relation Age of Onset  . Breast cancer Maternal Aunt 54    Social History   Socioeconomic History  . Marital status: Single    Spouse name: Not on file  . Number of children: 5  . Years of education: 8  . Highest education level: 8th grade  Occupational History  . Not on file  Tobacco Use  . Smoking status: Never Smoker  . Smokeless tobacco: Never Used  Vaping Use  . Vaping Use: Never used  Substance and Sexual Activity  . Alcohol use: Yes  . Drug use: Not Currently    Types: "Crack" cocaine, Benzodiazepines, Cocaine, Marijuana  . Sexual activity: Not on file  Other Topics Concern  . Not on file  Social History Narrative   Lives in boarding room with boyfriend   Social Determinants of Health   Financial Resource Strain:   . Difficulty of Paying Living Expenses:   Food Insecurity:   . Worried About Programme researcher, broadcasting/film/video in the  Last Year:   . Barista in the Last Year:   Transportation Needs:   . Freight forwarder (Medical):   Marland Kitchen Lack of Transportation (Non-Medical):   Physical Activity:   . Days of Exercise per Week:   . Minutes of Exercise per Session:   Stress:   . Feeling of Stress :   Social Connections:   . Frequency of Communication with Friends and Family:   . Frequency of Social Gatherings with Friends and Family:   . Attends Religious Services:   . Active Member of Clubs or Organizations:   . Attends Banker Meetings:   Marland Kitchen Marital Status:   Intimate Partner Violence:   . Fear of Current or Ex-Partner:   . Emotionally Abused:   Marland Kitchen Physically Abused:   . Sexually Abused:     Outpatient Medications Prior to Visit  Medication Sig Dispense Refill  . albuterol (VENTOLIN HFA) 108 (90 Base) MCG/ACT inhaler Inhale 1 puff into the lungs every 6 (six) hours as needed for wheezing or shortness of breath. 8 g 0  . divalproex (DEPAKOTE) 500 MG DR tablet Take 500 mg by mouth 2 (two) times daily.    . ferrous sulfate 325 (65 FE) MG tablet Take 1 tablet (325 mg total) by mouth daily with breakfast.  30 tablet 5  . loratadine (CLARITIN) 10 MG tablet Take 1 tablet (10 mg total) by mouth daily. 30 tablet 3  . pantoprazole (PROTONIX) 40 MG tablet Take 1 tablet (40 mg total) by mouth daily. 30 tablet 3  . sodium chloride (OCEAN) 0.65 % SOLN nasal spray Place 1 spray into both nostrils as needed for congestion. 15 mL 0  . traZODone (DESYREL) 50 MG tablet Take 50 mg by mouth at bedtime.     No facility-administered medications prior to visit.    No Known Allergies  ROS Review of Systems  Constitutional: Negative.   Eyes: Negative.   Respiratory: Negative.   Cardiovascular: Negative.   Neurological: Negative.   Psychiatric/Behavioral: Negative.       Objective:    Physical Exam HENT:     Mouth/Throat:     Mouth: Mucous membranes are moist.  Eyes:     Extraocular Movements:  Extraocular movements intact.     Pupils: Pupils are equal, round, and reactive to light.  Cardiovascular:     Rate and Rhythm: Normal rate and regular rhythm.     Pulses: Normal pulses.     Heart sounds: Normal heart sounds.  Pulmonary:     Effort: Pulmonary effort is normal.     Breath sounds: Normal breath sounds.  Abdominal:     General: Bowel sounds are normal.     Palpations: Abdomen is soft.  Skin:    General: Skin is warm.  Neurological:     General: No focal deficit present.     Mental Status: She is alert and oriented to person, place, and time. Mental status is at baseline.  Psychiatric:        Mood and Affect: Mood normal.        Behavior: Behavior normal.        Thought Content: Thought content normal.        Judgment: Judgment normal.     BP (!) 103/87 (BP Location: Left Arm, Patient Position: Sitting)   Pulse 86   Ht 5\' 3"  (1.6 m)   Wt 125 lb (56.7 kg)   SpO2 95%   BMI 22.14 kg/m  Wt Readings from Last 3 Encounters:  07/12/20 125 lb (56.7 kg)  06/27/20 124 lb 9.6 oz (56.5 kg)  06/20/20 126 lb (57.2 kg)     Health Maintenance Due  Topic Date Due  . HIV Screening  Never done    There are no preventive care reminders to display for this patient.  Lab Results  Component Value Date   TSH 3.190 02/23/2019   Lab Results  Component Value Date   WBC 3.8 06/27/2020   HGB 8.0 (L) 06/27/2020   HCT 25.8 (L) 06/27/2020   MCV 61 (L) 06/27/2020   PLT 262 06/27/2020   Lab Results  Component Value Date   NA 138 02/15/2020   K 3.5 02/15/2020   CO2 20 02/15/2020   GLUCOSE 85 02/15/2020   BUN 7 02/15/2020   CREATININE 0.59 02/15/2020   BILITOT 0.3 02/15/2020   ALKPHOS 93 02/15/2020   AST 53 (H) 02/15/2020   ALT 30 02/15/2020   PROT 7.8 02/15/2020   ALBUMIN 3.7 (L) 02/15/2020   CALCIUM 8.9 02/15/2020   ANIONGAP 23 (H) 07/19/2019   Lab Results  Component Value Date   CHOL 135 06/01/2019   Lab Results  Component Value Date   HDL 63 06/01/2019    Lab Results  Component Value Date   LDLCALC 61 06/01/2019  Lab Results  Component Value Date   TRIG 53 06/01/2019   Lab Results  Component Value Date   CHOLHDL 2.1 06/01/2019   Lab Results  Component Value Date   HGBA1C 4.9 02/23/2019      Assessment & Plan:     1. Iron deficiency anemia, unspecified iron deficiency anemia type - She will continue on Ferrous Sulfate 325 mg daily, she was provided with the Bus schedule, and advised to follow up with Hematologist Dr Smith Robert. She states that " I might go and I might not go". She was advised to notify clinic or go to the ED for worsening symptoms.    Follow-up: Return in about 13 weeks (around 10/11/2020), or if symptoms worsen or fail to improve.    Lalita Ebel Trellis Paganini, NP

## 2020-07-12 NOTE — Patient Instructions (Signed)

## 2020-08-10 ENCOUNTER — Ambulatory Visit: Payer: Self-pay

## 2020-08-13 ENCOUNTER — Ambulatory Visit: Payer: Self-pay | Attending: Internal Medicine

## 2020-08-13 DIAGNOSIS — Z23 Encounter for immunization: Secondary | ICD-10-CM

## 2020-08-13 NOTE — Progress Notes (Signed)
   Covid-19 Vaccination Clinic  Name:  Kara Lawrence    MRN: [id number] DOB: 1976-05-14  08/13/2020  [name] was observed post Covid-19 immunization for 15 minutes without incident. She was provided with Vaccine Information Sheet and instruction to access the V-Safe system.   [name] was instructed to call 911 with any severe reactions post vaccine: Marland Kitchen Difficulty breathing  . Swelling of face and throat  . A fast heartbeat  . A bad rash all over body  . Dizziness and weakness   Immunizations Administered    Name Date Dose VIS Date Route   Pfizer COVID-19 Vaccine 08/13/2020  2:04 PM 0.3 mL 02/08/2019 Intramuscular   Manufacturer: [employer], [employer]   Lot: [id number]   NDC: [address]-5

## 2020-08-23 ENCOUNTER — Other Ambulatory Visit: Payer: Self-pay | Admitting: Gerontology

## 2020-09-03 ENCOUNTER — Ambulatory Visit: Payer: Self-pay

## 2020-10-09 ENCOUNTER — Other Ambulatory Visit: Payer: Self-pay

## 2020-10-11 ENCOUNTER — Ambulatory Visit: Payer: Self-pay | Admitting: Gerontology

## 2020-10-11 ENCOUNTER — Other Ambulatory Visit: Payer: Self-pay | Admitting: Gerontology

## 2020-10-11 ENCOUNTER — Other Ambulatory Visit: Payer: Self-pay

## 2020-10-11 VITALS — BP 105/72 | HR 97 | Temp 97.7°F | Resp 16 | Wt 124.6 lb

## 2020-10-11 DIAGNOSIS — Z889 Allergy status to unspecified drugs, medicaments and biological substances status: Secondary | ICD-10-CM

## 2020-10-11 DIAGNOSIS — H538 Other visual disturbances: Secondary | ICD-10-CM | POA: Insufficient documentation

## 2020-10-11 DIAGNOSIS — K2100 Gastro-esophageal reflux disease with esophagitis, without bleeding: Secondary | ICD-10-CM

## 2020-10-11 DIAGNOSIS — Z0000 Encounter for general adult medical examination without abnormal findings: Secondary | ICD-10-CM

## 2020-10-11 MED ORDER — LORATADINE 10 MG PO TABS: 10.0000 mg | ORAL_TABLET | Freq: Every day | ORAL | 3 refills | Status: DC

## 2020-10-11 MED ORDER — PANTOPRAZOLE SODIUM 40 MG PO TBEC: 40.0000 mg | DELAYED_RELEASE_TABLET | Freq: Every day | ORAL | 3 refills | Status: DC

## 2020-10-11 NOTE — Progress Notes (Signed)
Established Patient Office Visit  Subjective:  Patient ID: Kara Lawrence, female    DOB: June 08, 1976  Age: 44 y.o. MRN: [id number]  CC: No chief complaint on file.   HPI  Kara Lawrence presents for follow up of acid reflux, blurry vision and medication refill. She states that her acid reflux is under controll with taking Protonix. She c/o blurry vision, stating that she has not had an eye exam in many years. Unable to read with over the counter reading glasses. She denies eye pain, discharge and change in vision. She has a history of bipolar disorder and follows up at [facility].She states that her mood is good, she denies suicidal nor homicidal ideation. Overall, she states that she's doing well and offers no further complaint.  Past Medical History:  Diagnosis Date  . Allergy   . Bipolar 1 disorder (HCC)   . Depression   . Manic depression (HCC)    Dr Suzie Portela, psychiatrist  . Seizure [facility])     Past Surgical History:  Procedure Laterality Date  . arm surgery    . TUBAL LIGATION Bilateral     Family History  Problem Relation Age of Onset  . Breast cancer Maternal Aunt 73    Social History   Socioeconomic History  . Marital status: Single    Spouse name: Not on file  . Number of children: 5  . Years of education: 8  . Highest education level: 8th grade  Occupational History  . Not on file  Tobacco Use  . Smoking status: Never Smoker  . Smokeless tobacco: Never Used  Vaping Use  . Vaping Use: Never used  Substance and Sexual Activity  . Alcohol use: Yes  . Drug use: Not Currently    Types: "Crack" cocaine, Benzodiazepines, Cocaine, Marijuana  . Sexual activity: Not on file  Other Topics Concern  . Not on file  Social History Narrative   Lives in boarding room with boyfriend   Social Determinants of Health   Financial Resource Strain:   . Difficulty of Paying Living Expenses: Not on file  Food Insecurity:   . Worried About Programme researcher, broadcasting/film/video  in the Last Year: Not on file  . Ran Out of Food in the Last Year: Not on file  Transportation Needs:   . Lack of Transportation (Medical): Not on file  . Lack of Transportation (Non-Medical): Not on file  Physical Activity:   . Days of Exercise per Week: Not on file  . Minutes of Exercise per Session: Not on file  Stress:   . Feeling of Stress : Not on file  Social Connections:   . Frequency of Communication with Friends and Family: Not on file  . Frequency of Social Gatherings with Friends and Family: Not on file  . Attends Religious Services: Not on file  . Active Member of Clubs or Organizations: Not on file  . Attends Banker Meetings: Not on file  . Marital Status: Not on file  Intimate Partner Violence:   . Fear of Current or Ex-Partner: Not on file  . Emotionally Abused: Not on file  . Physically Abused: Not on file  . Sexually Abused: Not on file    Outpatient Medications Prior to Visit  Medication Sig Dispense Refill  . albuterol (VENTOLIN HFA) 108 (90 Base) MCG/ACT inhaler Inhale 1 puff into the lungs every 6 (six) hours as needed for wheezing or shortness of breath. 8 g 0  . divalproex (DEPAKOTE) 500 MG  DR tablet Take 500 mg by mouth 2 (two) times daily.    . ferrous sulfate 325 (65 FE) MG tablet Take 1 tablet (325 mg total) by mouth daily with breakfast. 30 tablet 5  . traZODone (DESYREL) 50 MG tablet Take 50 mg by mouth at bedtime.    . pantoprazole (PROTONIX) 40 MG tablet Take 1 tablet (40 mg total) by mouth daily. 30 tablet 3  . sodium chloride (OCEAN) 0.65 % SOLN nasal spray Place 1 spray into both nostrils as needed for congestion. 15 mL 0  . loratadine (CLARITIN) 10 MG tablet Take 1 tablet (10 mg total) by mouth daily. 30 tablet 3   No facility-administered medications prior to visit.    No Known Allergies  ROS Review of Systems  Constitutional: Negative.   Eyes: Positive for visual disturbance (blurry vision).  Respiratory: Negative.    Cardiovascular: Negative.   Skin: Negative.   Neurological: Negative.   Psychiatric/Behavioral: Negative.       Objective:    Physical Exam HENT:     Head: Normocephalic and atraumatic.  Eyes:     Extraocular Movements: Extraocular movements intact.     Conjunctiva/sclera: Conjunctivae normal.     Pupils: Pupils are equal, round, and reactive to light.  Cardiovascular:     Rate and Rhythm: Normal rate and regular rhythm.     Pulses: Normal pulses.     Heart sounds: Normal heart sounds.  Pulmonary:     Effort: Pulmonary effort is normal.     Breath sounds: Normal breath sounds.  Abdominal:     General: Abdomen is flat. Bowel sounds are normal.     Palpations: Abdomen is soft.  Neurological:     General: No focal deficit present.     Mental Status: She is alert and oriented to person, place, and time. Mental status is at baseline.  Psychiatric:        Mood and Affect: Mood normal.        Behavior: Behavior normal.        Thought Content: Thought content normal.        Judgment: Judgment normal.     BP 105/72 (BP Location: Left Arm, Patient Position: Sitting, Cuff Size: Normal)   Pulse 97   Temp 97.7 F (36.5 C)   Resp 16   Wt 124 lb 9.6 oz (56.5 kg)   SpO2 97%   BMI 22.07 kg/m  Wt Readings from Last 3 Encounters:  10/11/20 124 lb 9.6 oz (56.5 kg)  07/12/20 125 lb (56.7 kg)  06/27/20 124 lb 9.6 oz (56.5 kg)     Health Maintenance Due  Topic Date Due  . HIV Screening  Never done  . COVID-19 Vaccine (2 - Pfizer 2-dose series) 09/03/2020    There are no preventive care reminders to display for this patient.  Lab Results  Component Value Date   TSH 3.190 02/23/2019   Lab Results  Component Value Date   WBC 3.8 06/27/2020   HGB 8.0 (L) 06/27/2020   HCT 25.8 (L) 06/27/2020   MCV 61 (L) 06/27/2020   PLT 262 06/27/2020   Lab Results  Component Value Date   NA 138 02/15/2020   K 3.5 02/15/2020   CO2 20 02/15/2020   GLUCOSE 85 02/15/2020   BUN 7  02/15/2020   CREATININE 0.59 02/15/2020   BILITOT 0.3 02/15/2020   ALKPHOS 93 02/15/2020   AST 53 (H) 02/15/2020   ALT 30 02/15/2020   PROT 7.8 02/15/2020  ALBUMIN 3.7 (L) 02/15/2020   CALCIUM 8.9 02/15/2020   ANIONGAP 23 (H) 07/19/2019   Lab Results  Component Value Date   CHOL 135 06/01/2019   Lab Results  Component Value Date   HDL 63 06/01/2019   Lab Results  Component Value Date   LDLCALC 61 06/01/2019   Lab Results  Component Value Date   TRIG 53 06/01/2019   Lab Results  Component Value Date   CHOLHDL 2.1 06/01/2019   Lab Results  Component Value Date   HGBA1C 4.9 02/23/2019      Assessment & Plan:     1. History of allergy -She states that her allergy is under control, and she will continue on her current treatment regimen. - loratadine (CLARITIN) 10 MG tablet; Take 1 tablet (10 mg total) by mouth daily.  Dispense: 30 tablet; Refill: 3  2. Gastroesophageal reflux disease with esophagitis - Her acid reflux is under control and she will continue on current treatment regimen. -Avoid spicy, fatty and fried food -Avoid sodas and sour juices -Avoid heavy meals -Avoid eating 4 hours before bedtime -Elevate head of bed at night - pantoprazole (PROTONIX) 40 MG tablet; Take 1 tablet (40 mg total) by mouth daily.  Dispense: 30 tablet; Refill: 3  3. Blurry vision -Will send referral to [facility]. - Ambulatory referral to Ophthalmology  4. Health care maintenance -Labs will be checked. - Valproic Acid level; Future - CBC w/Diff; Future - Iron Binding Cap (TIBC)([facility]); Future   Follow-up: Return in about 13 weeks (around 01/10/2021), or if symptoms worsen or fail to improve.    Kaley Jutras Trellis Paganini, NP

## 2020-10-22 ENCOUNTER — Other Ambulatory Visit: Payer: Self-pay | Admitting: Gerontology

## 2020-10-22 DIAGNOSIS — R0602 Shortness of breath: Secondary | ICD-10-CM

## 2020-10-23 ENCOUNTER — Other Ambulatory Visit: Payer: Self-pay | Admitting: Gerontology

## 2020-12-15 HISTORY — PX: VAGINAL HYSTERECTOMY: SUR661

## 2020-12-19 ENCOUNTER — Other Ambulatory Visit: Payer: Self-pay

## 2020-12-19 DIAGNOSIS — Z0000 Encounter for general adult medical examination without abnormal findings: Secondary | ICD-10-CM

## 2020-12-20 ENCOUNTER — Emergency Department: Payer: Self-pay

## 2020-12-20 ENCOUNTER — Other Ambulatory Visit: Payer: Self-pay

## 2020-12-20 ENCOUNTER — Emergency Department
Admission: EM | Admit: 2020-12-20 | Discharge: 2020-12-20 | Disposition: A | Discharge status: Home / Self Care | Payer: Self-pay | Attending: Emergency Medicine | Admitting: Emergency Medicine

## 2020-12-20 ENCOUNTER — Encounter: Payer: Self-pay | Admitting: Emergency Medicine

## 2020-12-20 DIAGNOSIS — J069 Acute upper respiratory infection, unspecified: Secondary | ICD-10-CM | POA: Insufficient documentation

## 2020-12-20 DIAGNOSIS — Z20822 Contact with and (suspected) exposure to covid-19: Secondary | ICD-10-CM | POA: Insufficient documentation

## 2020-12-20 LAB — SPECIMEN STATUS REPORT

## 2020-12-20 LAB — SARS CORONAVIRUS 2 (TAT 6-24 HRS): SARS Coronavirus 2: NEGATIVE

## 2020-12-20 MED ORDER — AZITHROMYCIN 250 MG PO TABS: ORAL_TABLET | ORAL | 0 refills | Status: AC

## 2020-12-20 NOTE — ED Triage Notes (Signed)
Pt refusing labs in triage, states "I just had labs done yesterday I didn't come up here for no blood I came up here for shortness of breath and a really really bad cough".

## 2020-12-20 NOTE — ED Triage Notes (Signed)
Pt to ED via EMS with c/o SOB and a bad cough x 2 weeks. Pt noted to be uncooperative in triage, difficult to get to answer questions. Pt c/o cough and states "when I spit up blood come up", when asked if streaks or chunks of blood in sputum pt states "I can't remember, I just know when I spit a few minutes ago blood come up". Pt refusing labs in triage.
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
## 2020-12-20 NOTE — ED Triage Notes (Signed)
[facility], c/o cough with congestion for the past week 97%RA, 140/90,

## 2020-12-21 LAB — CBC WITH DIFFERENTIAL/PLATELET
Basophils Absolute: 0 10*3/uL (ref 0.0–0.2)
Basos: 1 %
EOS (ABSOLUTE): 0 10*3/uL (ref 0.0–0.4)
Eos: 1 %
Hematocrit: 34.5 % (ref 34.0–46.6)
Hemoglobin: 11.6 g/dL (ref 11.1–15.9)
Immature Grans (Abs): 0 10*3/uL (ref 0.0–0.1)
Immature Granulocytes: 0 %
Lymphocytes Absolute: 1.8 10*3/uL (ref 0.7–3.1)
Lymphs: 35 %
MCH: 23.2 pg — ABNORMAL LOW (ref 26.6–33.0)
MCHC: 33.6 g/dL (ref 31.5–35.7)
MCV: 69 fL — ABNORMAL LOW (ref 79–97)
Monocytes Absolute: 0.7 10*3/uL (ref 0.1–0.9)
Monocytes: 15 %
Neutrophils Absolute: 2.4 10*3/uL (ref 1.4–7.0)
Neutrophils: 48 %
Platelets: 543 10*3/uL — ABNORMAL HIGH (ref 150–450)
RBC: 5.01 x10E6/uL (ref 3.77–5.28)
RDW: 18.8 % — ABNORMAL HIGH (ref 11.7–15.4)
WBC: 5 10*3/uL (ref 3.4–10.8)

## 2020-12-21 LAB — VALPROIC ACID LEVEL: Valproic Acid Lvl: <4 ug/mL — ABNORMAL LOW (ref 50–100)

## 2020-12-21 LAB — IRON AND TIBC
Iron Saturation: 11 % — ABNORMAL LOW (ref 15–55)
Iron: 45 ug/dL (ref 27–159)
Total Iron Binding Capacity: 403 ug/dL (ref 250–450)
UIBC: 358 ug/dL (ref 131–425)

## 2020-12-25 ENCOUNTER — Ambulatory Visit: Payer: Self-pay | Admitting: Gerontology

## 2021-01-10 ENCOUNTER — Other Ambulatory Visit: Payer: Self-pay | Admitting: Gerontology

## 2021-01-10 ENCOUNTER — Ambulatory Visit: Payer: Self-pay | Admitting: Gerontology

## 2021-01-10 DIAGNOSIS — K2100 Gastro-esophageal reflux disease with esophagitis, without bleeding: Secondary | ICD-10-CM

## 2021-01-10 DIAGNOSIS — Z889 Allergy status to unspecified drugs, medicaments and biological substances status: Secondary | ICD-10-CM

## 2021-01-10 DIAGNOSIS — D509 Iron deficiency anemia, unspecified: Secondary | ICD-10-CM

## 2021-01-10 MED ORDER — FERROUS SULFATE 325 (65 FE) MG PO TABS: 325.0000 mg | ORAL_TABLET | Freq: Every day | ORAL | 0 refills | Status: DC

## 2021-01-10 MED ORDER — PANTOPRAZOLE SODIUM 40 MG PO TBEC: 40.0000 mg | DELAYED_RELEASE_TABLET | Freq: Every day | ORAL | 0 refills | Status: DC

## 2021-01-10 MED ORDER — LORATADINE 10 MG PO TABS: 10.0000 mg | ORAL_TABLET | Freq: Every day | ORAL | 0 refills | Status: DC

## 2021-01-15 ENCOUNTER — Other Ambulatory Visit: Payer: Self-pay

## 2021-01-22 ENCOUNTER — Ambulatory Visit: Payer: Self-pay | Admitting: Gerontology

## 2021-01-23 ENCOUNTER — Ambulatory Visit: Payer: Self-pay | Admitting: Gerontology

## 2021-01-23 ENCOUNTER — Encounter: Payer: Self-pay | Admitting: Gerontology

## 2021-01-23 ENCOUNTER — Other Ambulatory Visit: Payer: Self-pay

## 2021-01-23 ENCOUNTER — Other Ambulatory Visit: Payer: Self-pay | Admitting: Gerontology

## 2021-01-23 VITALS — BP 134/91 | HR 95 | Temp 98.0°F | Resp 16 | Wt 118.2 lb

## 2021-01-23 DIAGNOSIS — J0100 Acute maxillary sinusitis, unspecified: Secondary | ICD-10-CM

## 2021-01-23 DIAGNOSIS — Z889 Allergy status to unspecified drugs, medicaments and biological substances status: Secondary | ICD-10-CM

## 2021-01-23 DIAGNOSIS — F319 Bipolar disorder, unspecified: Secondary | ICD-10-CM

## 2021-01-23 DIAGNOSIS — R7989 Other specified abnormal findings of blood chemistry: Secondary | ICD-10-CM | POA: Insufficient documentation

## 2021-01-23 DIAGNOSIS — R0602 Shortness of breath: Secondary | ICD-10-CM

## 2021-01-23 DIAGNOSIS — K2100 Gastro-esophageal reflux disease with esophagitis, without bleeding: Secondary | ICD-10-CM

## 2021-01-23 MED ORDER — ALBUTEROL SULFATE HFA 108 (90 BASE) MCG/ACT IN AERS
INHALATION_SPRAY | RESPIRATORY_TRACT | 2 refills | Status: DC
Start: 2021-01-23 — End: 2021-01-23

## 2021-01-23 MED ORDER — PANTOPRAZOLE SODIUM 40 MG PO TBEC: 40.0000 mg | DELAYED_RELEASE_TABLET | Freq: Every day | ORAL | 2 refills | Status: DC

## 2021-01-23 MED ORDER — SALINE SPRAY 0.65 % NA SOLN
1.0000 | NASAL | 1 refills | Status: DC | PRN
Start: 2021-01-23 — End: 2021-11-14

## 2021-01-23 MED ORDER — LORATADINE 10 MG PO TABS: 10.0000 mg | ORAL_TABLET | Freq: Every day | ORAL | 2 refills | Status: DC

## 2021-01-23 NOTE — Progress Notes (Signed)
Established Patient Office Visit  Subjective:  Patient ID: Kara Lawrence, female    DOB: 1976-11-10  Age: 45 y.o. MRN: [id number]  CC: Medications refilled  HPI  Kara Lawrence is a 45 year old female with PMH significant for GERD, biploar, allergies, and MDD who presents for medication refills. She reports her GERD is well-controlled with Protonix. No difficulties swallowing, mouth sores, sore throat, or hoarseness. Her allergies symptoms are also well-controlled with her current regimen. She does report chronic congestion but it is manageable with her Claritin. She sees Dr. Derrell Lolling for her bipolar disorder and MDD. She says her mood is up and down today related to a recent death in the family. No SI/HI. Otherwise, she feels well overall and has no further complaints.   Past Medical History:  Diagnosis Date  . Allergy   . Bipolar 1 disorder (HCC)   . Depression   . Manic depression (HCC)    Dr Suzie Portela, psychiatrist  . Seizure [facility])     Past Surgical History:  Procedure Laterality Date  . arm surgery    . TUBAL LIGATION Bilateral     Family History  Problem Relation Age of Onset  . Breast cancer Maternal Aunt 61    Social History   Socioeconomic History  . Marital status: Single    Spouse name: Not on file  . Number of children: 5  . Years of education: 8  . Highest education level: 8th grade  Occupational History  . Not on file  Tobacco Use  . Smoking status: Never Smoker  . Smokeless tobacco: Never Used  Vaping Use  . Vaping Use: Never used  Substance and Sexual Activity  . Alcohol use: Yes  . Drug use: Not Currently    Types: "Crack" cocaine, Benzodiazepines, Cocaine, Marijuana  . Sexual activity: Not on file  Other Topics Concern  . Not on file  Social History Narrative   Lives in boarding room with boyfriend   Social Determinants of Health   Financial Resource Strain: Not on file  Food Insecurity: Not on file  Transportation Needs:  Not on file  Physical Activity: Not on file  Stress: Not on file  Social Connections: Not on file  Intimate Partner Violence: Not on file    Outpatient Medications Prior to Visit  Medication Sig Dispense Refill  . albuterol (VENTOLIN HFA) 108 (90 Base) MCG/ACT inhaler INHALE ONE PUFF INTO THE LUNGS EVERY 6 HOURS AS NEEDED FOR WHEEZING OR SHORTNESS OF BREATH 6.7 g 0  . divalproex (DEPAKOTE) 500 MG DR tablet Take 500 mg by mouth 2 (two) times daily.    . ferrous sulfate 325 (65 FE) MG tablet Take 1 tablet (325 mg total) by mouth daily with breakfast. 30 tablet 0  . loratadine (CLARITIN) 10 MG tablet Take 1 tablet (10 mg total) by mouth daily. 30 tablet 0  . pantoprazole (PROTONIX) 40 MG tablet Take 1 tablet (40 mg total) by mouth daily. 30 tablet 0  . sodium chloride (OCEAN) 0.65 % SOLN nasal spray Place 1 spray into both nostrils as needed for congestion. 15 mL 0  . traZODone (DESYREL) 50 MG tablet Take 50 mg by mouth at bedtime.     No facility-administered medications prior to visit.    No Known Allergies  ROS Review of Systems  Constitutional: Negative.   HENT: Positive for congestion (chronic).   Respiratory: Negative.  Negative for shortness of breath.   Cardiovascular: Negative.  Negative for chest pain and leg  swelling.  Gastrointestinal: Negative.   Musculoskeletal: Negative.   Skin: Negative.   Allergic/Immunologic: Positive for environmental allergies.  Neurological: Negative.  Negative for dizziness, weakness and headaches.  Hematological: Negative.   Psychiatric/Behavioral: The patient is nervous/anxious.       Objective:    Physical Exam Constitutional:      Appearance: Normal appearance.  HENT:     Nose: Congestion present.     Comments: Pt reports this is chronic and r/t her allergies; declined further exam    Mouth/Throat:     Comments: Pt declined oral exam Cardiovascular:     Rate and Rhythm: Normal rate and regular rhythm.     Pulses: Normal pulses.      Heart sounds: Normal heart sounds.  Pulmonary:     Effort: Pulmonary effort is normal.     Breath sounds: Normal breath sounds.  Abdominal:     General: Bowel sounds are normal.     Palpations: Abdomen is soft.     Tenderness: There is no abdominal tenderness.  Musculoskeletal:     Right lower leg: No swelling.     Left lower leg: No swelling.  Skin:    General: Skin is warm and dry.  Neurological:     Mental Status: She is alert and oriented to person, place, and time.  Psychiatric:        Mood and Affect: Mood is anxious.        Behavior: Behavior normal.     There were no vitals taken for this visit. Wt Readings from Last 3 Encounters:  12/20/20 119 lb (54 kg)  12/19/20 119 lb (54 kg)  10/11/20 124 lb 9.6 oz (56.5 kg)     Health Maintenance Due  Topic Date Due  . HIV Screening  Never done  . COLONOSCOPY (Pts 45-41yrs Insurance coverage will need to be confirmed)  Never done  . COVID-19 Vaccine (2 - Pfizer 3-dose series) 09/03/2020    There are no preventive care reminders to display for this patient.  Lab Results  Component Value Date   TSH 3.190 02/23/2019   Lab Results  Component Value Date   WBC 5.0 12/19/2020   HGB 11.6 12/19/2020   HCT 34.5 12/19/2020   MCV 69 (L) 12/19/2020   PLT 543 (H) 12/19/2020   Lab Results  Component Value Date   NA 138 02/15/2020   K 3.5 02/15/2020   CO2 20 02/15/2020   GLUCOSE 85 02/15/2020   BUN 7 02/15/2020   CREATININE 0.59 02/15/2020   BILITOT 0.3 02/15/2020   ALKPHOS 93 02/15/2020   AST 53 (H) 02/15/2020   ALT 30 02/15/2020   PROT 7.8 02/15/2020   ALBUMIN 3.7 (L) 02/15/2020   CALCIUM 8.9 02/15/2020   ANIONGAP 23 (H) 07/19/2019   Lab Results  Component Value Date   CHOL 135 06/01/2019   Lab Results  Component Value Date   HDL 63 06/01/2019   Lab Results  Component Value Date   LDLCALC 61 06/01/2019   Lab Results  Component Value Date   TRIG 53 06/01/2019   Lab Results  Component Value Date    CHOLHDL 2.1 06/01/2019   Lab Results  Component Value Date   HGBA1C 4.9 02/23/2019      Assessment & Plan:   1. Gastroesophageal reflux disease with esophagitis Well-controlled on current regimen Reviewed diet changes and foods to avoid  No changes to current medication - pantoprazole (PROTONIX) 40 MG tablet; Take 1 tablet (40 mg total) by  mouth daily.  Dispense: 30 tablet; Refill: 2  2. SOB (shortness of breath) on exertion Chronic - reports r/t allergies No acute findings on PE - O2 96% on RA and lung fields clear bilaterally.  No changes to current regimen Go to ED if SOB or difficulties breathing occur and unrelieved by inhaler - albuterol (VENTOLIN HFA) 108 (90 Base) MCG/ACT inhaler; INHALE ONE PUFF INTO THE LUNGS EVERY 6 HOURS AS NEEDED FOR WHEEZING OR SHORTNESS OF BREATH  Dispense: 6.7 g; Refill: 2  3. Acute maxillary sinusitis, recurrence not specified Well-controlled on current regimen No changes to medications - sodium chloride (OCEAN) 0.65 % SOLN nasal spray; Place 1 spray into both nostrils as needed for congestion.  Dispense: 15 mL; Refill: 1  4. History of allergy Well-controlled on current regimen No changes to medications Avoid environmental triggers when possible - loratadine (CLARITIN) 10 MG tablet; Take 1 tablet (10 mg total) by mouth daily.  Dispense: 30 tablet; Refill: 2  5. Elevated platelet count Referral to hematology - pt declined  No acute findings on exam Notify if any SOB or leg pain develops  6. Bipolar 1 disorder (HCC) Followed by psych at [facility] Continue with current medication regimen Notify immediately or go to the ED for any SI/HI Valproic acid level low - results sent to Dr. Derrell Lolling   Follow-up: Return in 3 months (04/23/21), or if symptoms worsen or do not improve.    Noemi Chapel, RN, BSN, FNP-S

## 2021-01-23 NOTE — Patient Instructions (Signed)
Food Choices for Gastroesophageal Reflux Disease, Adult When you have gastroesophageal reflux disease (GERD), the foods you eat and your eating habits are very important. Choosing the right foods can help ease your discomfort. Think about working with a food expert (dietitian) to help you make good choices. What are tips for following this plan? Reading food labels  Look for foods that are low in saturated fat. Foods that may help with your symptoms include: ? Foods that have less than 5% of daily value (DV) of fat. ? Foods that have 0 grams of trans fat. Cooking  Do not fry your food.  Cook your food by baking, steaming, grilling, or broiling. These are all methods that do not need a lot of fat for cooking.  To add flavor, try to use herbs that are low in spice and acidity. Meal planning  Choose healthy foods that are low in fat, such as: ? Fruits and vegetables. ? Whole grains. ? Low-fat dairy products. ? Lean meats, fish, and poultry.  Eat small meals often instead of eating 3 large meals each day. Eat your meals slowly in a place where you are relaxed. Avoid bending over or lying down until 2-3 hours after eating.  Limit high-fat foods such as fatty meats or fried foods.  Limit your intake of fatty foods, such as oils, butter, and shortening.  Avoid the following as told by your doctor: ? Foods that cause symptoms. These may be different for different people. Keep a food diary to keep track of foods that cause symptoms. ? Alcohol. ? Drinking a lot of liquid with meals. ? Eating meals during the 2-3 hours before bed.   Lifestyle  Stay at a healthy weight. Ask your doctor what weight is healthy for you. If you need to lose weight, work with your doctor to do so safely.  Exercise for at least 30 minutes on 5 or more days each week, or as told by your doctor.  Wear loose-fitting clothes.  Do not smoke or use any products that contain nicotine or tobacco. If you need help  quitting, ask your doctor.  Sleep with the head of your bed higher than your feet. Use a wedge under the mattress or blocks under the bed frame to raise the head of the bed.  Chew sugar-free gum after meals. What foods should eat? Eat a healthy, well-balanced diet of fruits, vegetables, whole grains, low-fat dairy products, lean meats, fish, and poultry. Each person is different. Foods that may cause symptoms in one person may not cause any symptoms in another person. Work with your doctor to find foods that are safe for you. The items listed above may not be a complete list of what you can eat and drink. Contact a food expert for more options.   What foods should I avoid? Limiting some of these foods may help in managing the symptoms of GERD. Everyone is different. Talk with a food expert or your doctor to help you find the exact foods to avoid, if any. Fruits Any fruits prepared with added fat. Any fruits that cause symptoms. For some people, this may include citrus fruits, such as oranges, grapefruit, pineapple, and lemons. Vegetables Deep-fried vegetables. French fries. Any vegetables prepared with added fat. Any vegetables that cause symptoms. For some people, this may include tomatoes and tomato products, chili peppers, onions and garlic, and horseradish. Grains Pastries or quick breads with added fat. Meats and other proteins High-fat meats, such as fatty beef or pork,   hot dogs, ribs, ham, sausage, salami, and bacon. Fried meat or protein, including fried fish and fried chicken. Nuts and nut butters, in large amounts. Dairy Whole milk and chocolate milk. Sour cream. Cream. Ice cream. Cream cheese. Milkshakes. Fats and oils Butter. Margarine. Shortening. Ghee. Beverages Coffee and tea, with or without caffeine. Carbonated beverages. Sodas. Energy drinks. Fruit juice made with acidic fruits, such as orange or grapefruit. Tomato juice. Alcoholic drinks. Sweets and desserts Chocolate and  cocoa. Donuts. Seasonings and condiments Pepper. Peppermint and spearmint. Added salt. Any condiments, herbs, or seasonings that cause symptoms. For some people, this may include curry, hot sauce, or vinegar-based salad dressings. The items listed above may not be a complete list of what you should not eat and drink. Contact a food expert for more options. Questions to ask your doctor Diet and lifestyle changes are often the first steps that are taken to manage symptoms of GERD. If diet and lifestyle changes do not help, talk with your doctor about taking medicines. Where to find more information  International Foundation for Gastrointestinal Disorders: aboutgerd.org Summary  When you have GERD, food and lifestyle choices are very important in easing your symptoms.  Eat small meals often instead of 3 large meals a day. Eat your meals slowly and in a place where you are relaxed.  Avoid bending over or lying down until 2-3 hours after eating.  Limit high-fat foods such as fatty meats or fried foods. This information is not intended to replace advice given to you by your health care provider. Make sure you discuss any questions you have with your health care provider. Document Revised: 06/11/2020 Document Reviewed: 06/11/2020 Elsevier Patient Education  2021 Elsevier Inc.  

## 2021-03-04 ENCOUNTER — Telehealth: Payer: Self-pay | Admitting: Pharmacy Technician

## 2021-03-04 NOTE — Telephone Encounter (Signed)
Patient failed to provide 2022 proof of income.  No additional medication assistance will be provided by [facility] without the required proof of income documentation.  Patient notified by letter.  Sherilyn Dacosta Care Manager [facility]   [address] [place], [place]  [address]    March 04, 2021    Joelynn Dust [address] [place], [place]  [address]  Dear Lesleigh Noe:  This is to inform you that you are no longer eligible to receive medication assistance at [facility].  The reason(s) are:    _____Your total gross monthly household income exceeds 250% of the Federal Poverty Level.   _____Tangible assets (savings, checking, stocks/bonds, pension, retirement, etc.) exceeds our limit  _____You are eligible to receive benefits from [facility], [facility] or HIV Medication              Assistance Program _____You are eligible to receive benefits from a Medicare Part "D" plan _____You have prescription insurance  _____You are not an [facility] resident __X__Failure to provide all requested proof of income information for 2022.    Medication assistance will resume once all requested financial information has been returned to our clinic.  If you have questions, please contact our clinic at [phone].    Thank you,  [facility]

## 2021-03-18 ENCOUNTER — Other Ambulatory Visit: Payer: Self-pay

## 2021-03-18 MED FILL — Duloxetine HCl Enteric Coated Pellets Cap 30 MG (Base Eq): ORAL | 30 days supply | Qty: 30 | Fill #0 | Status: AC

## 2021-03-29 ENCOUNTER — Other Ambulatory Visit: Payer: Self-pay

## 2021-04-23 ENCOUNTER — Other Ambulatory Visit: Payer: Self-pay

## 2021-04-23 ENCOUNTER — Encounter: Payer: Self-pay | Admitting: Gerontology

## 2021-04-23 ENCOUNTER — Ambulatory Visit: Payer: Self-pay | Admitting: Gerontology

## 2021-04-23 VITALS — BP 120/79 | HR 92 | Temp 97.3°F | Resp 16 | Wt 117.0 lb

## 2021-04-23 DIAGNOSIS — D509 Iron deficiency anemia, unspecified: Secondary | ICD-10-CM

## 2021-04-23 DIAGNOSIS — R0602 Shortness of breath: Secondary | ICD-10-CM

## 2021-04-23 DIAGNOSIS — K2100 Gastro-esophageal reflux disease with esophagitis, without bleeding: Secondary | ICD-10-CM

## 2021-04-23 DIAGNOSIS — Z889 Allergy status to unspecified drugs, medicaments and biological substances status: Secondary | ICD-10-CM

## 2021-04-23 MED ORDER — PANTOPRAZOLE SODIUM 40 MG PO TBEC
DELAYED_RELEASE_TABLET | Freq: Every day | ORAL | 2 refills | Status: DC
  Filled 2021-04-23: qty 14, 14d supply, fill #0
  Filled 2021-05-24: qty 14, 14d supply, fill #1
  Filled 2021-07-31: qty 30, 30d supply, fill #2

## 2021-04-23 MED ORDER — LORATADINE 10 MG PO TABS
ORAL_TABLET | Freq: Every day | ORAL | 2 refills | Status: DC
  Filled 2021-04-23: qty 14, 14d supply, fill #0
  Filled 2021-05-24: qty 14, 14d supply, fill #1
  Filled 2021-07-31: qty 30, 30d supply, fill #2

## 2021-04-23 MED ORDER — ALBUTEROL SULFATE HFA 108 (90 BASE) MCG/ACT IN AERS
INHALATION_SPRAY | RESPIRATORY_TRACT | 2 refills | Status: DC
  Filled 2021-04-23: qty 6.7, 50d supply, fill #0
  Filled 2021-07-31: qty 6.7, 50d supply, fill #1

## 2021-04-23 NOTE — Progress Notes (Signed)
Established Patient Office Visit  Subjective:  Patient ID: Kara Lawrence, female    DOB: 1976-04-29  Age: 45 y.o. MRN: [id number]  CC: No chief complaint on file.   HPI Kerrianne Seanne Chirico presents for is a 45 year old female with PMH significant for GERD, biploar, allergies, and MDD who presents for medication refills. She reports her GERD is well-controlled with Protonix. She denies any difficulty with swallowing, mouth sores, sore throat, or hoarseness. Her allergies symptoms are also well-controlled with her current regimen. She sees Dr. Dalbert Batman for her bipolar disorder and MDD, states that her mood is good, denies suicidal nor homicidal ideation. She states that she's experiencing hot flashes since her vaginal hysterectomy procedure on 01/12/20 at [facility]. She declines Ob/Gyn referral due to lack of transportation. Overall, she states that she's doing well and offers no further complaint.  Past Medical History:  Diagnosis Date  . Allergy   . Bipolar 1 disorder ([place])   . Depression   . Manic depression ([place])    Dr Randel Books, psychiatrist  . Seizure [facility])     Past Surgical History:  Procedure Laterality Date  . arm surgery    . TUBAL LIGATION Bilateral     Family History  Problem Relation Age of Onset  . Breast cancer Maternal Aunt 67    Social History   Socioeconomic History  . Marital status: Single    Spouse name: Not on file  . Number of children: 5  . Years of education: 8  . Highest education level: 8th grade  Occupational History  . Not on file  Tobacco Use  . Smoking status: Never Smoker  . Smokeless tobacco: Never Used  Vaping Use  . Vaping Use: Never used  Substance and Sexual Activity  . Alcohol use: Yes  . Drug use: Not Currently    Types: "Crack" cocaine, Benzodiazepines, Cocaine, Marijuana  . Sexual activity: Not on file  Other Topics Concern  . Not on file  Social History Narrative   Lives in boarding room with boyfriend   Social  Determinants of Health   Financial Resource Strain: Not on file  Food Insecurity: Not on file  Transportation Needs: Not on file  Physical Activity: Not on file  Stress: Not on file  Social Connections: Not on file  Intimate Partner Violence: Not on file    Outpatient Medications Prior to Visit  Medication Sig Dispense Refill  . divalproex (DEPAKOTE ER) 250 MG 24 hr tablet TAKE TWO TABLETS BY MOUTH 2 TIMES A DAY 120 tablet 2  . divalproex (DEPAKOTE ER) 500 MG 24 hr tablet TAKE TWO TABLETS BY MOUTH AT BEDTIME OR TAKE ONE TABLET BY MOUTH 2 TIMES A DAY 60 tablet 1  . DULoxetine (CYMBALTA) 30 MG capsule TAKE ONE CAPSULE BY MOUTH EVERY MORNING OR AT BEDTIME 30 capsule 1  . ferrous sulfate 325 (65 FE) MG tablet TAKE ONE TABLET BY MOUTH EVERY DAY WITH BREAKFAST 30 tablet 0  . prazosin (MINIPRESS) 1 MG capsule TAKE 2 CAPSULES BY MOUTH AT BEDTIME *MAY REDUCE TO 1 CAPSULE AT NIGHT IF 2 CAPSULES CAUSE TOO MUCH DROWSINESS* 60 capsule 2  . sodium chloride (OCEAN) 0.65 % SOLN nasal spray Place 1 spray into both nostrils as needed for congestion. 15 mL 1  . traZODone (DESYREL) 50 MG tablet Take 50 mg by mouth at bedtime.    Marland Kitchen albuterol (VENTOLIN HFA) 108 (90 Base) MCG/ACT inhaler INHALE ONE PUFF INTO THE LUNGS EVERY 6 HOURS AS NEEDED  FOR WHEEZING OR SHORTNESS OF BREATH 8.5 g 2  . pantoprazole (PROTONIX) 40 MG tablet TAKE ONE TABLET BY MOUTH EVERY DAY 30 tablet 2  . prazosin (MINIPRESS) 2 MG capsule TAKE ONE CAPSULE BY MOUTH AT BEDTIME 30 capsule 1  . traZODone (DESYREL) 50 MG tablet TAKE ONE TABLET BY MOUTH AT BEDTIME IF YOU CAN'T SLEEP 30 tablet 1  . divalproex (DEPAKOTE) 500 MG DR tablet Take 500 mg by mouth 2 (two) times daily. (Patient not taking: Reported on 04/23/2021)    . loratadine (CLARITIN) 10 MG tablet TAKE ONE TABLET BY MOUTH EVERY DAY 30 tablet 2   No facility-administered medications prior to visit.    No Known Allergies  ROS Review of Systems  Constitutional: Negative.    Respiratory: Negative.   Cardiovascular: Negative.   Neurological: Negative.   Psychiatric/Behavioral: Negative.       Objective:    Physical Exam HENT:     Head: Normocephalic and atraumatic.  Cardiovascular:     Rate and Rhythm: Normal rate and regular rhythm.     Pulses: Normal pulses.     Heart sounds: Normal heart sounds.  Pulmonary:     Effort: Pulmonary effort is normal.     Breath sounds: Normal breath sounds.  Skin:    General: Skin is warm.  Neurological:     General: No focal deficit present.     Mental Status: She is alert and oriented to person, place, and time. Mental status is at baseline.  Psychiatric:        Mood and Affect: Mood normal.        Behavior: Behavior normal.        Thought Content: Thought content normal.        Judgment: Judgment normal.     BP 120/79 (BP Location: Left Arm, Patient Position: Sitting, Cuff Size: Large)   Pulse 92   Temp (!) 97.3 F (36.3 C)   Resp 16   Wt 117 lb (53.1 kg)   LMP 10/25/2019 (Approximate)   SpO2 96%   BMI 20.08 kg/m  Wt Readings from Last 3 Encounters:  04/23/21 117 lb (53.1 kg)  01/23/21 118 lb 3.2 oz (53.6 kg)  12/20/20 119 lb (54 kg)     Health Maintenance Due  Topic Date Due  . HIV Screening  Never done  . COLONOSCOPY (Pts 45-69yr Insurance coverage will need to be confirmed)  Never done  . COVID-19 Vaccine (2 - Pfizer 3-dose series) 09/03/2020    There are no preventive care reminders to display for this patient.  Lab Results  Component Value Date   TSH 3.190 02/23/2019   Lab Results  Component Value Date   WBC 5.0 12/19/2020   HGB 11.6 12/19/2020   HCT 34.5 12/19/2020   MCV 69 (L) 12/19/2020   PLT 543 (H) 12/19/2020   Lab Results  Component Value Date   NA 138 02/15/2020   K 3.5 02/15/2020   CO2 20 02/15/2020   GLUCOSE 85 02/15/2020   BUN 7 02/15/2020   CREATININE 0.59 02/15/2020   BILITOT 0.3 02/15/2020   ALKPHOS 93 02/15/2020   AST 53 (H) 02/15/2020   ALT 30  02/15/2020   PROT 7.8 02/15/2020   ALBUMIN 3.7 (L) 02/15/2020   CALCIUM 8.9 02/15/2020   ANIONGAP 23 (H) 07/19/2019   Lab Results  Component Value Date   CHOL 135 06/01/2019   Lab Results  Component Value Date   HDL 63 06/01/2019   Lab Results  Component Value Date   LDLCALC 61 06/01/2019   Lab Results  Component Value Date   TRIG 53 06/01/2019   Lab Results  Component Value Date   CHOLHDL 2.1 06/01/2019   Lab Results  Component Value Date   HGBA1C 4.9 02/23/2019      Assessment & Plan:     1. SOB (shortness of breath) on exertion - Her breathing is stable, she will continue on current treatment regimen. - albuterol (VENTOLIN HFA) 108 (90 Base) MCG/ACT inhaler; INHALE ONE PUFF INTO THE LUNGS EVERY 6 HOURS AS NEEDED FOR WHEEZING OR SHORTNESS OF BREATH  Dispense: 8.5 g; Refill: 2  2. History of allergy - Her seasonal allergie symptoms are under control and she will continue current medication. - loratadine (CLARITIN) 10 MG tablet; TAKE ONE TABLET BY MOUTH EVERY DAY  Dispense: 30 tablet; Refill: 2  3. Gastroesophageal reflux disease with esophagitis - Her acid reflux is under control, she will continue on current medication -Avoid spicy, fatty and fried food -Avoid sodas and sour juice -Avoid heavy meals -Avoid eating 4 hours before bedtime -Elevate head of bed at night - pantoprazole (PROTONIX) 40 MG tablet; TAKE ONE TABLET BY MOUTH EVERY DAY  Dispense: 30 tablet; Refill: 2  4. Iron deficiency anemia, unspecified iron deficiency anemia type -Will recheck labs since she had vaginal hysterectomy and no menstrual period after 01/12/20. She will continue on ferrous sulfate. - CBC w/Diff; Future - Iron Binding Cap (TIBC)([facility]); Future - Comp Met (CMET); Future   Follow-up: Return in about 3 months (around 07/31/2021), or if symptoms worsen or fail to improve.    Deryl Giroux Jerold Coombe, NP

## 2021-05-03 ENCOUNTER — Other Ambulatory Visit: Payer: Self-pay

## 2021-05-07 ENCOUNTER — Other Ambulatory Visit: Payer: Self-pay

## 2021-05-07 MED ORDER — TRAZODONE HCL 50 MG PO TABS
ORAL_TABLET | ORAL | 0 refills | Status: DC
  Filled 2021-05-07 – 2021-05-24 (×2): qty 30, 30d supply, fill #0

## 2021-05-07 MED ORDER — DULOXETINE HCL 30 MG PO CPEP
ORAL_CAPSULE | ORAL | 0 refills | Status: DC
  Filled 2021-05-07 – 2021-05-24 (×2): qty 30, 30d supply, fill #0

## 2021-05-07 MED ORDER — PRAZOSIN HCL 1 MG PO CAPS
ORAL_CAPSULE | ORAL | 0 refills | Status: DC
  Filled 2021-07-31: qty 60, 30d supply, fill #0

## 2021-05-07 MED ORDER — DIVALPROEX SODIUM ER 500 MG PO TB24
ORAL_TABLET | ORAL | 0 refills | Status: DC
  Filled 2021-05-07: qty 60, 30d supply, fill #0
  Filled 2021-07-31: qty 40, 20d supply, fill #0

## 2021-05-22 ENCOUNTER — Telehealth: Payer: Self-pay | Admitting: Pharmacist

## 2021-05-22 NOTE — Telephone Encounter (Signed)
Patient approved for medication assistance at [facility] until 02/12/22, as long as eligibility criteria continues to be met.   Vonda Henderson [facility] Administrative Assistant 

## 2021-05-24 ENCOUNTER — Other Ambulatory Visit: Payer: Self-pay

## 2021-05-24 ENCOUNTER — Other Ambulatory Visit: Payer: Self-pay | Admitting: Gerontology

## 2021-05-24 DIAGNOSIS — D509 Iron deficiency anemia, unspecified: Secondary | ICD-10-CM

## 2021-05-24 MED FILL — Divalproex Sodium Tab ER 24 HR 500 MG: ORAL | 30 days supply | Qty: 60 | Fill #0 | Status: AC

## 2021-05-24 MED FILL — Divalproex Sodium Tab ER 24 HR 250 MG: ORAL | 30 days supply | Qty: 120 | Fill #0 | Status: CN

## 2021-05-24 MED FILL — Prazosin HCl Cap 2 MG: ORAL | 30 days supply | Qty: 30 | Fill #0 | Status: AC

## 2021-05-27 ENCOUNTER — Other Ambulatory Visit: Payer: Self-pay

## 2021-05-28 ENCOUNTER — Other Ambulatory Visit: Payer: Self-pay

## 2021-05-28 MED ORDER — FERROUS SULFATE 325 (65 FE) MG PO TABS
ORAL_TABLET | ORAL | 0 refills | Status: DC
  Filled 2021-05-28: qty 30, 30d supply, fill #0

## 2021-05-31 ENCOUNTER — Other Ambulatory Visit: Payer: Self-pay

## 2021-07-24 ENCOUNTER — Other Ambulatory Visit: Payer: Self-pay

## 2021-07-24 DIAGNOSIS — D509 Iron deficiency anemia, unspecified: Secondary | ICD-10-CM

## 2021-07-26 LAB — COMPREHENSIVE METABOLIC PANEL
ALT: 56 IU/L — ABNORMAL HIGH (ref 0–32)
AST: 79 IU/L — ABNORMAL HIGH (ref 0–40)
Albumin/Globulin Ratio: 1.1 — ABNORMAL LOW (ref 1.2–2.2)
Albumin: 4 g/dL (ref 3.8–4.8)
Alkaline Phosphatase: 88 IU/L (ref 44–121)
BUN/Creatinine Ratio: 6 — ABNORMAL LOW (ref 9–23)
BUN: 4 mg/dL — ABNORMAL LOW (ref 6–24)
Bilirubin Total: 0.4 mg/dL (ref 0.0–1.2)
CO2: 22 mmol/L (ref 20–29)
Calcium: 9.6 mg/dL (ref 8.7–10.2)
Chloride: 102 mmol/L (ref 96–106)
Creatinine, Ser: 0.68 mg/dL (ref 0.57–1.00)
Globulin, Total: 3.6 g/dL (ref 1.5–4.5)
Glucose: 98 mg/dL (ref 65–99)
Potassium: 3.8 mmol/L (ref 3.5–5.2)
Sodium: 138 mmol/L (ref 134–144)
Total Protein: 7.6 g/dL (ref 6.0–8.5)
eGFR: 108 mL/min/{1.73_m2} (ref >59)

## 2021-07-26 LAB — CBC WITH DIFFERENTIAL/PLATELET
Basophils Absolute: 0 10*3/uL (ref 0.0–0.2)
Basos: 1 %
EOS (ABSOLUTE): 0 10*3/uL (ref 0.0–0.4)
Eos: 1 %
Hematocrit: 38.3 % (ref 34.0–46.6)
Hemoglobin: 12.5 g/dL (ref 11.1–15.9)
Immature Grans (Abs): 0 10*3/uL (ref 0.0–0.1)
Immature Granulocytes: 0 %
Lymphocytes Absolute: 1.7 10*3/uL (ref 0.7–3.1)
Lymphs: 46 %
MCH: 25 pg — ABNORMAL LOW (ref 26.6–33.0)
MCHC: 32.6 g/dL (ref 31.5–35.7)
MCV: 76 fL — ABNORMAL LOW (ref 79–97)
Monocytes Absolute: 0.6 10*3/uL (ref 0.1–0.9)
Monocytes: 16 %
Neutrophils Absolute: 1.3 10*3/uL — ABNORMAL LOW (ref 1.4–7.0)
Neutrophils: 36 %
Platelets: 352 10*3/uL (ref 150–450)
RBC: 5.01 x10E6/uL (ref 3.77–5.28)
RDW: 15.1 % (ref 11.7–15.4)
WBC: 3.6 10*3/uL (ref 3.4–10.8)

## 2021-07-26 LAB — IRON AND TIBC
Iron Saturation: 38 % (ref 15–55)
Iron: 135 ug/dL (ref 27–159)
Total Iron Binding Capacity: 355 ug/dL (ref 250–450)
UIBC: 220 ug/dL (ref 131–425)

## 2021-07-31 ENCOUNTER — Ambulatory Visit: Payer: Self-pay | Admitting: Gerontology

## 2021-07-31 ENCOUNTER — Other Ambulatory Visit: Payer: Self-pay

## 2021-07-31 VITALS — BP 110/80 | HR 87 | Temp 97.9°F | Resp 16 | Ht 61.5 in | Wt 123.7 lb

## 2021-07-31 DIAGNOSIS — D509 Iron deficiency anemia, unspecified: Secondary | ICD-10-CM

## 2021-07-31 DIAGNOSIS — K219 Gastro-esophageal reflux disease without esophagitis: Secondary | ICD-10-CM

## 2021-07-31 DIAGNOSIS — R899 Unspecified abnormal finding in specimens from other organs, systems and tissues: Secondary | ICD-10-CM

## 2021-07-31 MED ORDER — FERROUS SULFATE 325 (65 FE) MG PO TABS
ORAL_TABLET | ORAL | 0 refills | Status: DC
  Filled 2021-07-31: qty 30, 30d supply, fill #0

## 2021-07-31 NOTE — Progress Notes (Signed)
Established Patient Office Visit  Subjective:  Patient ID: Kara Lawrence, female    DOB: Jan 18, 1976  Age: 45 y.o. MRN: [id number]  CC:  Chief Complaint  Patient presents with   Follow-up    Labs drawn 07/24/21   Anemia    HPI Kara Lawrence pis a 45 year old female with PMH significant for GERD, biploar, allergies, and MDD who presents for lab review.  She states that she's compliant with her medications and denies side effects. Her ALT was 56  IU/L and AST was 79 IU/L, she admits to drinking beer on the days she's off from work and won't specify the number of beer she drinks. She denies withdrawal symptoms. She also has a positive Hep C test last year and declines Gastroenterology referral for treatment.  She denies right upper quadrant abdominal pain and jaundice. She had hysterectomy, her anemia has improved, except MCV was 76 and MCH 25, her TIBC was within normal limits. She states that her mood is good, denies suicidal/homicidal ideation and follows up at [facility]. Overall, she states that she's doing well and offers no further complaint.  Past Medical History:  Diagnosis Date   Allergy    Bipolar 1 disorder ([place])    Depression    Manic depression (HCC)    Dr Randel Books, psychiatrist   Seizure [facility])     Past Surgical History:  Procedure Laterality Date   arm surgery     TUBAL LIGATION Bilateral     Family History  Problem Relation Age of Onset   Breast cancer Maternal Aunt 67    Social History   Socioeconomic History   Marital status: Single    Spouse name: Not on file   Number of children: 5   Years of education: 8   Highest education level: 8th grade  Occupational History   Not on file  Tobacco Use   Smoking status: Never   Smokeless tobacco: Never  Vaping Use   Vaping Use: Never used  Substance and Sexual Activity   Alcohol use: Yes   Drug use: Not Currently    Types: "Crack" cocaine, Benzodiazepines, Cocaine, Marijuana   Sexual activity:  Not on file  Other Topics Concern   Not on file  Social History Narrative   Lives in boarding room with boyfriend   Social Determinants of Health   Financial Resource Strain: Not on file  Food Insecurity: Not on file  Transportation Needs: Not on file  Physical Activity: Not on file  Stress: Not on file  Social Connections: Not on file  Intimate Partner Violence: Not on file    Outpatient Medications Prior to Visit  Medication Sig Dispense Refill   albuterol (VENTOLIN HFA) 108 (90 Base) MCG/ACT inhaler INHALE ONE PUFF INTO THE LUNGS EVERY 6 HOURS AS NEEDED FOR WHEEZING OR SHORTNESS OF BREATH 8.5 g 2   divalproex (DEPAKOTE ER) 500 MG 24 hr tablet Take 2 tablets by mouth at bedtime OR may take one tablet twice daily. 60 tablet 0   DULoxetine (CYMBALTA) 30 MG capsule TAKE ONE CAPSULE (30MG TOTAL) BY MOUTH ONCE DAILY. (MAY TAKE IN THE MORNING OR AT NIGHT, WHICHEVER IS PREFERRED). 30 capsule 0   loratadine (CLARITIN) 10 MG tablet TAKE ONE TABLET BY MOUTH EVERY DAY 30 tablet 2   pantoprazole (PROTONIX) 40 MG tablet TAKE ONE TABLET BY MOUTH EVERY DAY 30 tablet 2   prazosin (MINIPRESS) 1 MG capsule TAKE 2 CAPSULES (2MG TOTAL) BY MOUTH ONCE DAILY AT BEDTIME.  60 capsule 0   prazosin (MINIPRESS) 2 MG capsule TAKE ONE CAPSULE BY MOUTH AT BEDTIME 30 capsule 1   sodium chloride (OCEAN) 0.65 % SOLN nasal spray Place 1 spray into both nostrils as needed for congestion. 15 mL 1   traZODone (DESYREL) 50 MG tablet Take 50 mg by mouth at bedtime.     divalproex (DEPAKOTE ER) 500 MG 24 hr tablet TAKE TWO TABLETS BY MOUTH AT BEDTIME OR TAKE ONE TABLET BY MOUTH 2 TIMES A DAY 60 tablet 1   ferrous sulfate (FEROSUL) 325 (65 FE) MG tablet TAKE ONE TABLET BY MOUTH EVERY DAY WITH BREAKFAST 30 tablet 0   prazosin (MINIPRESS) 1 MG capsule TAKE 2 CAPSULES BY MOUTH AT BEDTIME *MAY REDUCE TO 1 CAPSULE AT NIGHT IF 2 CAPSULES CAUSE TOO MUCH DROWSINESS* 60 capsule 2   traZODone (DESYREL) 50 MG tablet TAKE ONE TABLET BY  MOUTH AT BEDTIME IF YOU CAN'T SLEEP 30 tablet 1   traZODone (DESYREL) 50 MG tablet TAKE ONE TABLET (50MG TOTAL) BY MOUTH ONCE DAILY AT BEDTIME. (ONLY TAKE THIS IF YOU CAN'T SLEEP). 30 tablet 0   No facility-administered medications prior to visit.    No Known Allergies  ROS Review of Systems  Constitutional: Negative.   Respiratory: Negative.    Cardiovascular: Negative.   Gastrointestinal: Negative.   Skin: Negative.   Neurological: Negative.   Psychiatric/Behavioral: Negative.       Objective:    Physical Exam HENT:     Head: Normocephalic and atraumatic.  Cardiovascular:     Rate and Rhythm: Normal rate and regular rhythm.     Pulses: Normal pulses.     Heart sounds: Normal heart sounds.  Pulmonary:     Effort: Pulmonary effort is normal.     Breath sounds: Normal breath sounds.  Skin:    General: Skin is warm.  Neurological:     General: No focal deficit present.     Mental Status: She is alert and oriented to person, place, and time. Mental status is at baseline.  Psychiatric:        Mood and Affect: Mood normal.        Behavior: Behavior normal.        Thought Content: Thought content normal.        Judgment: Judgment normal.    BP 110/80 (BP Location: Right Arm, Patient Position: Sitting, Cuff Size: Normal)   Pulse 87   Temp 97.9 F (36.6 C)   Resp 16   Ht 5' 1.5" (1.562 m)   Wt 123 lb 11.2 oz (56.1 kg)   LMP 10/25/2019 (Approximate)   SpO2 95%   BMI 22.99 kg/m  Wt Readings from Last 3 Encounters:  07/31/21 123 lb 11.2 oz (56.1 kg)  07/24/21 119 lb 9.6 oz (54.3 kg)  04/23/21 117 lb (53.1 kg)     Health Maintenance Due  Topic Date Due   Pneumococcal Vaccine 7-16 Years old (1 - PCV) Never done   HIV Screening  Never done   COLONOSCOPY (Pts 45-59yr Insurance coverage will need to be confirmed)  Never done   COVID-19 Vaccine (2 - Pfizer series) 09/03/2020   INFLUENZA VACCINE  07/15/2021    There are no preventive care reminders to display for  this patient.  Lab Results  Component Value Date   TSH 3.190 02/23/2019   Lab Results  Component Value Date   WBC 3.6 07/24/2021   HGB 12.5 07/24/2021   HCT 38.3 07/24/2021   MCV 76 (  L) 07/24/2021   PLT 352 07/24/2021   Lab Results  Component Value Date   NA 138 07/24/2021   K 3.8 07/24/2021   CO2 22 07/24/2021   GLUCOSE 98 07/24/2021   BUN 4 (L) 07/24/2021   CREATININE 0.68 07/24/2021   BILITOT 0.4 07/24/2021   ALKPHOS 88 07/24/2021   AST 79 (H) 07/24/2021   ALT 56 (H) 07/24/2021   PROT 7.6 07/24/2021   ALBUMIN 4.0 07/24/2021   CALCIUM 9.6 07/24/2021   ANIONGAP 23 (H) 07/19/2019   EGFR 108 07/24/2021   Lab Results  Component Value Date   CHOL 135 06/01/2019   Lab Results  Component Value Date   HDL 63 06/01/2019   Lab Results  Component Value Date   LDLCALC 61 06/01/2019   Lab Results  Component Value Date   TRIG 53 06/01/2019   Lab Results  Component Value Date   CHOLHDL 2.1 06/01/2019   Lab Results  Component Value Date   HGBA1C 4.9 02/23/2019      Assessment & Plan:     1. Iron deficiency anemia, unspecified iron deficiency anemia type -Her Anemia has improved, she will continue on medication for one month. Advised to increase intake of iron rich food. - ferrous sulfate (FEROSUL) 325 (65 FE) MG tablet; TAKE ONE TABLET BY MOUTH EVERY DAY WITH BREAKFAST  Dispense: 30 tablet; Refill: 0  2. Gastroesophageal reflux disease without esophagitis - her acid reflux is under control and she will continue on current medication. -Avoid spicy, fatty and fried food Avoid sodas and sour juices Avoid heavy meals Avoid eating 4 hours before bedtime Elevate head of bed at night   3. Abnormal laboratory test -Her ALT and AST were elevated, she was encouraged on alcohol abstinence. She declines GI referral for Hep C treatment.   Follow-up: Return in about 3 months (around 10/31/2021), or if symptoms worsen or fail to improve.    Ming Kunka Jerold Coombe,  NP

## 2021-08-01 ENCOUNTER — Other Ambulatory Visit: Payer: Self-pay | Admitting: Gerontology

## 2021-08-01 ENCOUNTER — Other Ambulatory Visit: Payer: Self-pay

## 2021-08-01 ENCOUNTER — Ambulatory Visit: Payer: Self-pay | Admitting: Gerontology

## 2021-08-02 ENCOUNTER — Other Ambulatory Visit: Payer: Self-pay

## 2021-08-07 ENCOUNTER — Other Ambulatory Visit: Payer: Self-pay

## 2021-08-08 ENCOUNTER — Other Ambulatory Visit: Payer: Self-pay

## 2021-09-10 ENCOUNTER — Other Ambulatory Visit: Payer: Self-pay

## 2021-09-10 MED ORDER — PRAZOSIN HCL 1 MG PO CAPS
2.0000 mg | ORAL_CAPSULE | Freq: Every day | ORAL | 0 refills | Status: DC
  Filled 2021-09-10: qty 60, 30d supply, fill #0

## 2021-09-10 MED ORDER — DIVALPROEX SODIUM ER 500 MG PO TB24
1000.0000 mg | ORAL_TABLET | Freq: Every day | ORAL | 0 refills | Status: DC
  Filled 2021-09-10 – 2021-09-11 (×2): qty 60, 30d supply, fill #0

## 2021-09-10 MED ORDER — TRAZODONE HCL 50 MG PO TABS
50.0000 mg | ORAL_TABLET | Freq: Every day | ORAL | 0 refills | Status: DC
  Filled 2021-09-10: qty 30, 30d supply, fill #0

## 2021-09-10 MED ORDER — DULOXETINE HCL 30 MG PO CPEP
30.0000 mg | ORAL_CAPSULE | Freq: Every day | ORAL | 0 refills | Status: DC
  Filled 2021-09-10: qty 30, 30d supply, fill #0

## 2021-09-11 ENCOUNTER — Other Ambulatory Visit: Payer: Self-pay

## 2021-10-08 ENCOUNTER — Other Ambulatory Visit: Payer: Self-pay

## 2021-10-08 MED ORDER — PRAZOSIN HCL 1 MG PO CAPS: ORAL_CAPSULE | ORAL | 3 refills | Status: DC

## 2021-10-08 MED ORDER — DULOXETINE HCL 30 MG PO CPEP: ORAL_CAPSULE | ORAL | 3 refills | Status: DC

## 2021-10-08 MED ORDER — DIVALPROEX SODIUM ER 500 MG PO TB24: ORAL_TABLET | ORAL | 3 refills | Status: DC

## 2021-10-08 MED ORDER — TRAZODONE HCL 50 MG PO TABS
ORAL_TABLET | ORAL | 3 refills | Status: DC
  Filled 2021-10-08: qty 60, 30d supply, fill #0

## 2021-10-31 ENCOUNTER — Ambulatory Visit: Payer: Self-pay | Admitting: Gerontology

## 2021-11-14 ENCOUNTER — Other Ambulatory Visit: Payer: Self-pay

## 2021-11-14 ENCOUNTER — Ambulatory Visit: Payer: Self-pay | Admitting: Gerontology

## 2021-11-14 ENCOUNTER — Other Ambulatory Visit: Payer: Self-pay | Admitting: Gerontology

## 2021-11-14 VITALS — BP 124/81 | HR 91 | Ht 61.0 in | Wt 129.5 lb

## 2021-11-14 DIAGNOSIS — R0602 Shortness of breath: Secondary | ICD-10-CM

## 2021-11-14 DIAGNOSIS — Z889 Allergy status to unspecified drugs, medicaments and biological substances status: Secondary | ICD-10-CM

## 2021-11-14 DIAGNOSIS — K2100 Gastro-esophageal reflux disease with esophagitis, without bleeding: Secondary | ICD-10-CM

## 2021-11-14 MED ORDER — LORATADINE 10 MG PO TABS
ORAL_TABLET | Freq: Every day | ORAL | 1 refills | Status: DC
  Filled 2021-11-14: qty 90, 90d supply, fill #0

## 2021-11-14 MED ORDER — ALBUTEROL SULFATE HFA 108 (90 BASE) MCG/ACT IN AERS
INHALATION_SPRAY | RESPIRATORY_TRACT | 2 refills | Status: DC
  Filled 2021-11-14: qty 8.5, 50d supply, fill #0

## 2021-11-14 MED ORDER — PANTOPRAZOLE SODIUM 40 MG PO TBEC
DELAYED_RELEASE_TABLET | Freq: Every day | ORAL | 1 refills | Status: DC
  Filled 2021-11-14: qty 60, 60d supply, fill #0

## 2021-11-14 NOTE — Progress Notes (Signed)
Established Patient Office Visit  Subjective:  Patient ID: Kara Lawrence, female    DOB: June 29, 1976  Age: 45 y.o. MRN: [id number]  CC:  Chief Complaint  Patient presents with   Follow-up    No labs done.     HPI Kara Lawrence is a 45 year old female with PMH significant for GERD, biploar, allergies, and MDD presents for routine follow up visit and medication refill. She states that she's compliant with her medications, denies side effects and continues to make dietary changes. She states that her acid reflux is under control with taking Protonix and also her seasonal allergy with taking Claritin. She declines Influenza vaccine, states that she will get her Covid booster tomorrow. She states that her mood is good, denies suicidal nor homicidal ideation and follows up at [facility] for her mental health. Overall, she states that she's doing well and offers no further complaint.  Past Medical History:  Diagnosis Date   Allergy    Bipolar 1 disorder ([place])    Depression    Manic depression (HCC)    Dr Randel Books, psychiatrist   Seizure [facility])     Past Surgical History:  Procedure Laterality Date   arm surgery     TUBAL LIGATION Bilateral     Family History  Problem Relation Age of Onset   Breast cancer Maternal Aunt 67    Social History   Socioeconomic History   Marital status: Single    Spouse name: Not on file   Number of children: 5   Years of education: 8   Highest education level: 8th grade  Occupational History   Not on file  Tobacco Use   Smoking status: Never   Smokeless tobacco: Never  Vaping Use   Vaping Use: Never used  Substance and Sexual Activity   Alcohol use: Yes   Drug use: Not Currently    Types: "Crack" cocaine, Benzodiazepines, Cocaine, Marijuana   Sexual activity: Not on file  Other Topics Concern   Not on file  Social History Narrative   Lives in boarding room with boyfriend   Social Determinants of Health   Financial  Resource Strain: Not on file  Food Insecurity: Not on file  Transportation Needs: Not on file  Physical Activity: Not on file  Stress: Not on file  Social Connections: Not on file  Intimate Partner Violence: Not on file    Outpatient Medications Prior to Visit  Medication Sig Dispense Refill   albuterol (VENTOLIN HFA) 108 (90 Base) MCG/ACT inhaler INHALE ONE PUFF INTO THE LUNGS EVERY 6 HOURS AS NEEDED FOR WHEEZING OR SHORTNESS OF BREATH 8.5 g 2   divalproex (DEPAKOTE ER) 500 MG 24 hr tablet TAKE 2 TABLETS BY MOUTH ONCE DAILY AT BEDTIME. 60 tablet 3   DULoxetine (CYMBALTA) 30 MG capsule TAKE ONE CAPSULE BY MOUTH ONCE DAILY. 30 capsule 3   prazosin (MINIPRESS) 1 MG capsule TAKE 2 CAPSULES BY MOUTH ONCE DAILY AT BEDTIME. 60 capsule 3   traZODone (DESYREL) 50 MG tablet Take 50 mg by mouth at bedtime.     loratadine (CLARITIN) 10 MG tablet TAKE ONE TABLET BY MOUTH EVERY DAY 30 tablet 2   pantoprazole (PROTONIX) 40 MG tablet TAKE ONE TABLET BY MOUTH EVERY DAY 30 tablet 2   ferrous sulfate (FEROSUL) 325 (65 FE) MG tablet TAKE ONE TABLET BY MOUTH EVERY DAY WITH BREAKFAST (Patient not taking: Reported on 11/14/2021) 30 tablet 0   traZODone (DESYREL) 50 MG tablet Take 1 -  2 tablets by mouth once daily at bedtime - only take this if you can't sleep 60 tablet 3   sodium chloride (OCEAN) 0.65 % SOLN nasal spray Place 1 spray into both nostrils as needed for congestion. (Patient not taking: Reported on 11/14/2021) 15 mL 1   No facility-administered medications prior to visit.    No Known Allergies  ROS Review of Systems  Constitutional: Negative.   Respiratory: Negative.    Cardiovascular: Negative.   Skin: Negative.   Neurological: Negative.   Psychiatric/Behavioral: Negative.       Objective:    Physical Exam HENT:     Head: Normocephalic and atraumatic.  Cardiovascular:     Rate and Rhythm: Normal rate and regular rhythm.     Pulses: Normal pulses.     Heart sounds: Normal heart  sounds.  Pulmonary:     Effort: Pulmonary effort is normal.     Breath sounds: Normal breath sounds.  Neurological:     General: No focal deficit present.     Mental Status: She is alert and oriented to person, place, and time. Mental status is at baseline.  Psychiatric:        Mood and Affect: Mood normal.        Behavior: Behavior normal.        Thought Content: Thought content normal.        Judgment: Judgment normal.    BP 124/81   Pulse 91   Ht 5' 1"  (1.549 m)   Wt 129 lb 8 oz (58.7 kg)   LMP 10/25/2019 (Approximate)   BMI 24.47 kg/m  Wt Readings from Last 3 Encounters:  11/14/21 129 lb 8 oz (58.7 kg)  07/31/21 123 lb 11.2 oz (56.1 kg)  07/24/21 119 lb 9.6 oz (54.3 kg)     Health Maintenance Due  Topic Date Due   Pneumococcal Vaccine 25-52 Years old (1 - PCV) Never done   HIV Screening  Never done   COLONOSCOPY (Pts 45-20yr Insurance coverage will need to be confirmed)  Never done   COVID-19 Vaccine (2 - Pfizer series) 09/03/2020    There are no preventive care reminders to display for this patient.  Lab Results  Component Value Date   TSH 3.190 02/23/2019   Lab Results  Component Value Date   WBC 3.6 07/24/2021   HGB 12.5 07/24/2021   HCT 38.3 07/24/2021   MCV 76 (L) 07/24/2021   PLT 352 07/24/2021   Lab Results  Component Value Date   NA 138 07/24/2021   K 3.8 07/24/2021   CO2 22 07/24/2021   GLUCOSE 98 07/24/2021   BUN 4 (L) 07/24/2021   CREATININE 0.68 07/24/2021   BILITOT 0.4 07/24/2021   ALKPHOS 88 07/24/2021   AST 79 (H) 07/24/2021   ALT 56 (H) 07/24/2021   PROT 7.6 07/24/2021   ALBUMIN 4.0 07/24/2021   CALCIUM 9.6 07/24/2021   ANIONGAP 23 (H) 07/19/2019   EGFR 108 07/24/2021   Lab Results  Component Value Date   CHOL 135 06/01/2019   Lab Results  Component Value Date   HDL 63 06/01/2019   Lab Results  Component Value Date   LDLCALC 61 06/01/2019   Lab Results  Component Value Date   TRIG 53 06/01/2019   Lab Results   Component Value Date   CHOLHDL 2.1 06/01/2019   Lab Results  Component Value Date   HGBA1C 4.9 02/23/2019      Assessment & Plan:    1. Gastroesophageal  reflux disease with esophagitis - Her acid reflux is under control, she will continue on current medication. -Avoid spicy, fatty and fried food -Avoid sodas and sour juices -Avoid heavy meals -Avoid eating 4 hours before bedtime -Elevate head of bed at night - pantoprazole (PROTONIX) 40 MG tablet; TAKE ONE TABLET BY MOUTH EVERY DAY  Dispense: 90 tablet; Refill: 1  2. History of allergy - Her allergy is under control, and she will continue on current medication. - loratadine (CLARITIN) 10 MG tablet; TAKE ONE TABLET BY MOUTH EVERY DAY  Dispense: 90 tablet; Refill: 1     Follow-up: Return in about 21 weeks (around 04/10/2022), or if symptoms worsen or fail to improve.    Sharlie Shreffler Jerold Coombe, NP

## 2021-11-15 ENCOUNTER — Other Ambulatory Visit: Payer: Self-pay

## 2021-12-18 ENCOUNTER — Other Ambulatory Visit: Payer: Self-pay

## 2021-12-23 ENCOUNTER — Other Ambulatory Visit: Payer: Self-pay

## 2021-12-24 ENCOUNTER — Emergency Department
Admission: EM | Admit: 2021-12-24 | Discharge: 2021-12-24 | Disposition: A | Discharge status: Home / Self Care | Payer: Self-pay | Attending: Emergency Medicine | Admitting: Emergency Medicine

## 2021-12-24 ENCOUNTER — Emergency Department: Payer: Self-pay

## 2021-12-24 ENCOUNTER — Other Ambulatory Visit: Payer: Self-pay

## 2021-12-24 DIAGNOSIS — M25511 Pain in right shoulder: Secondary | ICD-10-CM | POA: Insufficient documentation

## 2021-12-24 MED ORDER — MELOXICAM 15 MG PO TABS
15.0000 mg | ORAL_TABLET | Freq: Every day | ORAL | 1 refills | Status: DC
  Filled 2021-12-24: qty 7, 7d supply, fill #0

## 2021-12-24 MED ORDER — KETOROLAC TROMETHAMINE 30 MG/ML IJ SOLN
30.0000 mg | Freq: Once | INTRAMUSCULAR | Status: AC
  Administered 2021-12-24: 30 mg via INTRAMUSCULAR
  Filled 2021-12-24: qty 1

## 2021-12-24 NOTE — ED Provider Notes (Signed)
[facility] Provider Note  Patient Contact: 8:35 PM (approximate)   History   Shoulder Pain   HPI   Kara Lawrence is a 46 y.o. female presents to the emergency department with right shoulder pain that has occurred for the past 2 days.  No falls or mechanisms of trauma.  No chest pain, chest tightness, shortness of breath or abdominal pain.      Physical Exam   Triage Vital Signs: ED Triage Vitals [12/24/21 1843]  Enc Vitals Group     BP (!) 118/91     Pulse Rate 73     Resp 18     Temp 98.4 F (36.9 C)     Temp Source Oral     SpO2 97 %     Weight 150 lb (68 kg)     Height 5' (1.524 m)     Head Circumference      Peak Flow      Pain Score 6     Pain Loc      Pain Edu?      Excl. in GC?     Most recent vital signs: Vitals:   12/24/21 1843  BP: (!) 118/91  Pulse: 73  Resp: 18  Temp: 98.4 F (36.9 C)  SpO2: 97%     General: Alert and in no acute distress. Eyes:  PERRL. EOMI. Head: No acute traumatic findings ENT:      Ears: Tms are pearly.       Nose: No congestion/rhinnorhea.      Mouth/Throat: Mucous membranes are moist.  Neck: No stridor. No cervical spine tenderness to palpation. Cardiovascular:  Good peripheral perfusion Respiratory: Normal respiratory effort without tachypnea or retractions. Lungs CTAB. Good air entry to the bases with no decreased or absent breath sounds. Gastrointestinal: Bowel sounds 4 quadrants. Soft and nontender to palpation. No guarding or rigidity. No palpable masses. No distention. No CVA tenderness. Musculoskeletal: Patient has symmetric strength in the upper extremities.  She does have pain with right rotator cuff testing.  Full range of motion to all extremities. Palpable radial and ulnar pulses bilaterally and symmetrically.  Capillary refill less than 2 seconds on the right. Neurologic:  No gross focal neurologic deficits are appreciated.  Skin:   No rash noted Other:   ED  Results / Procedures / Treatments   Labs (all labs ordered are listed, but only abnormal results are displayed) Labs Reviewed - No data to display       MEDICATIONS ORDERED IN ED: Medications  ketorolac (TORADOL) 30 MG/ML injection 30 mg (30 mg Intramuscular Given 12/24/21 2033)     IMPRESSION / MDM / ASSESSMENT AND PLAN / ED COURSE  I reviewed the triage vital signs and the nursing notes.                              Differential diagnosis includes, but is not limited to, rotator cuff tendinitis, frozen shoulder, biceps tendinitis...  Assessment and plan:  Left shoulder pain:  46 year old female presents to the emergency department with right shoulder pain.  No bony abnormality on x-ray of the right shoulder.  Patient was given injection of Toradol and discharged with meloxicam.  Ortho follow-up was recommended.   FINAL CLINICAL IMPRESSION(S) / ED DIAGNOSES   Final diagnoses:  Acute pain of right shoulder     Rx / DC Orders   ED Discharge Orders  Ordered    meloxicam (MOBIC) 15 MG tablet  Daily        12/24/21 2006             Note:  This document was prepared using Dragon voice recognition software and may include unintentional dictation errors.   Pia Mau [place], Cordelia Poche 12/24/21 2051    Phineas Semen, MD 12/24/21 2053

## 2021-12-24 NOTE — Discharge Instructions (Addendum)
Take Meloxicam once daily.  Apply ice for 5-10 mins daily.

## 2021-12-24 NOTE — ED Triage Notes (Signed)
Pt to ED for right shoulder pain x2 days. States previous injury. Worsening pain with movement.  NAD noted  Pt constantly texting in triage.

## 2021-12-25 ENCOUNTER — Other Ambulatory Visit: Payer: Self-pay

## 2022-01-09 ENCOUNTER — Other Ambulatory Visit: Payer: Self-pay

## 2022-01-09 ENCOUNTER — Ambulatory Visit: Payer: Self-pay | Admitting: Gerontology

## 2022-01-09 ENCOUNTER — Encounter: Payer: Self-pay | Admitting: Gerontology

## 2022-01-09 VITALS — BP 118/84 | HR 88 | Temp 97.8°F | Resp 16 | Ht 62.0 in | Wt 137.2 lb

## 2022-01-09 DIAGNOSIS — M25511 Pain in right shoulder: Secondary | ICD-10-CM | POA: Insufficient documentation

## 2022-01-09 MED ORDER — MELOXICAM 15 MG PO TABS
15.0000 mg | ORAL_TABLET | Freq: Every day | ORAL | 0 refills | Status: DC
  Filled 2022-01-09: qty 14, 14d supply, fill #0

## 2022-01-09 NOTE — Progress Notes (Signed)
Established Patient Office Visit  Subjective:  Patient ID: Kara Lawrence, female    DOB: Dec 07, 1976  Age: 46 y.o. MRN: [id number]  CC:  Chief Complaint  Patient presents with   Hospitalization Follow-up    Patient seen at [facility] ED on 12/24/21 for right shoulder pain.    HPI Kara Lawrence  is a 46 year old female with PMH significant for GERD, biploar, allergies, and MDD presents for follow after an ED visit. She was seen at the ED on 12/24/21 for right shoulder pain. Imaging done was negative for fracture or dislocation, there is no evidence of arthropathy or other focal bone abnormality, and soft tissues are unremarkable. She was treated with 15 mg Meloxicam and wears arm sling. Currently, she denies pain to her right shoulder today, but states that pain is intermittent. She states that her right shoulder pain is dull 3/10 and the intensity increases with activity. She states that lifting more than 20 lbs aggravates symptoms and requests a work note . She denies motor/muscle weakness nor paresthesia. Overall, she states that she's doing well and offers no further complaint.  Past Medical History:  Diagnosis Date   Allergy    Bipolar 1 disorder ([place])    Depression    Manic depression (HCC)    Dr Randel Books, psychiatrist   Seizure [facility])     Past Surgical History:  Procedure Laterality Date   arm surgery     TUBAL LIGATION Bilateral     Family History  Problem Relation Age of Onset   Breast cancer Maternal Aunt 67    Social History   Socioeconomic History   Marital status: Single    Spouse name: Not on file   Number of children: 5   Years of education: 8   Highest education level: 8th grade  Occupational History   Not on file  Tobacco Use   Smoking status: Never   Smokeless tobacco: Never   Tobacco comments:    Patient refuses to give any [facility] or answer SDOH question.  Vaping Use   Vaping Use: Never used  Substance and Sexual Activity   Alcohol  use: Yes   Drug use: Not Currently    Types: "Crack" cocaine, Benzodiazepines, Cocaine, Marijuana   Sexual activity: Not on file  Other Topics Concern   Not on file  Social History Narrative   Lives in boarding room with boyfriend   Social Determinants of Health   Financial Resource Strain: Not on file  Food Insecurity: Unknown   Worried About [place] in the Last Year: Patient refused   Arboriculturist in the Last Year: Patient refused  Transportation Needs: Unknown   Film/video editor (Medical): Patient refused   Lack of Transportation (Non-Medical): Patient refused  Physical Activity: Not on file  Stress: Not on file  Social Connections: Not on file  Intimate Partner Violence: Not on file    Outpatient Medications Prior to Visit  Medication Sig Dispense Refill   albuterol (VENTOLIN HFA) 108 (90 Base) MCG/ACT inhaler INHALE ONE PUFF INTO THE LUNGS EVERY 6 HOURS AS NEEDED FOR WHEEZING OR SHORTNESS OF BREATH 8.5 g 2   divalproex (DEPAKOTE ER) 500 MG 24 hr tablet TAKE 2 TABLETS BY MOUTH ONCE DAILY AT BEDTIME. 60 tablet 3   DULoxetine (CYMBALTA) 30 MG capsule TAKE ONE CAPSULE BY MOUTH ONCE DAILY. 30 capsule 3   loratadine (CLARITIN) 10 MG tablet TAKE ONE TABLET BY MOUTH EVERY DAY 90 tablet  1   pantoprazole (PROTONIX) 40 MG tablet TAKE ONE TABLET BY MOUTH EVERY DAY 90 tablet 1   prazosin (MINIPRESS) 1 MG capsule TAKE 2 CAPSULES BY MOUTH ONCE DAILY AT BEDTIME. 60 capsule 3   traZODone (DESYREL) 50 MG tablet Take 1 - 2 tablets by mouth once daily at bedtime - only take this if you can't sleep 60 tablet 3   meloxicam (MOBIC) 15 MG tablet Take 15 mg by mouth daily.     ferrous sulfate (FEROSUL) 325 (65 FE) MG tablet TAKE ONE TABLET BY MOUTH EVERY DAY WITH BREAKFAST (Patient not taking: Reported on 11/14/2021) 30 tablet 0   traZODone (DESYREL) 50 MG tablet Take 50 mg by mouth at bedtime.     No facility-administered medications prior to visit.    No Known  Allergies  ROS Review of Systems  Constitutional: Negative.   Respiratory: Negative.    Cardiovascular: Negative.   Musculoskeletal:  Positive for arthralgias (right shoulder pain).  Neurological: Negative.      Objective:    Physical Exam HENT:     Head: Normocephalic and atraumatic.  Eyes:     Extraocular Movements: Extraocular movements intact.     Conjunctiva/sclera: Conjunctivae normal.     Pupils: Pupils are equal, round, and reactive to light.  Cardiovascular:     Rate and Rhythm: Normal rate and regular rhythm.     Pulses: Normal pulses.     Heart sounds: Normal heart sounds.  Pulmonary:     Effort: Pulmonary effort is normal.     Breath sounds: Normal breath sounds.  Musculoskeletal:        General: Tenderness (with palpation to right shoulder) present.  Neurological:     General: No focal deficit present.     Mental Status: She is alert and oriented to person, place, and time. Mental status is at baseline.  Psychiatric:        Mood and Affect: Mood normal.        Behavior: Behavior normal.        Thought Content: Thought content normal.        Judgment: Judgment normal.    BP 118/84 (BP Location: Left Arm, Patient Position: Sitting, Cuff Size: Large)    Pulse 88    Temp 97.8 F (36.6 C) (Oral)    Resp 16    Ht 5' 2"  (1.575 m)    Wt 137 lb 3.2 oz (62.2 kg)    LMP 10/25/2019 (Approximate)    SpO2 97%    BMI 25.09 kg/m  Wt Readings from Last 3 Encounters:  01/09/22 137 lb 3.2 oz (62.2 kg)  12/24/21 150 lb (68 kg)  11/14/21 129 lb 8 oz (58.7 kg)     Health Maintenance Due  Topic Date Due   HIV Screening  Never done   COLONOSCOPY (Pts 45-13yr Insurance coverage will need to be confirmed)  Never done   COVID-19 Vaccine (2 - Pfizer series) 09/03/2020    There are no preventive care reminders to display for this patient.  Lab Results  Component Value Date   TSH 3.190 02/23/2019   Lab Results  Component Value Date   WBC 3.6 07/24/2021   HGB 12.5  07/24/2021   HCT 38.3 07/24/2021   MCV 76 (L) 07/24/2021   PLT 352 07/24/2021   Lab Results  Component Value Date   NA 138 07/24/2021   K 3.8 07/24/2021   CO2 22 07/24/2021   GLUCOSE 98 07/24/2021   BUN 4 (L)  07/24/2021   CREATININE 0.68 07/24/2021   BILITOT 0.4 07/24/2021   ALKPHOS 88 07/24/2021   AST 79 (H) 07/24/2021   ALT 56 (H) 07/24/2021   PROT 7.6 07/24/2021   ALBUMIN 4.0 07/24/2021   CALCIUM 9.6 07/24/2021   ANIONGAP 23 (H) 07/19/2019   EGFR 108 07/24/2021   Lab Results  Component Value Date   CHOL 135 06/01/2019   Lab Results  Component Value Date   HDL 63 06/01/2019   Lab Results  Component Value Date   LDLCALC 61 06/01/2019   Lab Results  Component Value Date   TRIG 53 06/01/2019   Lab Results  Component Value Date   CHOLHDL 2.1 06/01/2019   Lab Results  Component Value Date   HGBA1C 4.9 02/23/2019      Assessment & Plan:    1. Acute pain of right shoulder - Possible Muscle sprain, frozen shoulder. She will continue on Meloxicam 15 mg daily, wear right arm slings and to notify clinic with worsening symptoms. She will continue on light duty and not to lift greater than 20 lbs. - meloxicam (MOBIC) 15 MG tablet; Take 1 tablet (15 mg total) by mouth once daily.  Dispense: 14 tablet; Refill: 0    Follow-up: Return if symptoms worsen or fail to improve.    Donivin Wirt Jerold Coombe, NP

## 2022-02-10 ENCOUNTER — Other Ambulatory Visit: Payer: Self-pay

## 2022-02-11 ENCOUNTER — Other Ambulatory Visit: Payer: Self-pay

## 2022-02-11 MED ORDER — PRAZOSIN HCL 1 MG PO CAPS
2.0000 mg | ORAL_CAPSULE | Freq: Every day | ORAL | 0 refills | Status: DC
  Filled 2022-02-11: qty 30, 15d supply, fill #0

## 2022-02-11 MED ORDER — DIVALPROEX SODIUM ER 500 MG PO TB24
1000.0000 mg | ORAL_TABLET | Freq: Every day | ORAL | 0 refills | Status: DC
  Filled 2022-02-11: qty 30, 15d supply, fill #0

## 2022-02-11 MED ORDER — TRAZODONE HCL 50 MG PO TABS
50.0000 mg | ORAL_TABLET | Freq: Every day | ORAL | 0 refills | Status: DC
  Filled 2022-02-11: qty 30, 15d supply, fill #0

## 2022-02-11 MED ORDER — DULOXETINE HCL 30 MG PO CPEP
30.0000 mg | ORAL_CAPSULE | Freq: Every day | ORAL | 0 refills | Status: DC
  Filled 2022-02-11: qty 15, 15d supply, fill #0

## 2022-02-25 ENCOUNTER — Other Ambulatory Visit: Payer: Self-pay

## 2022-02-25 MED ORDER — DIVALPROEX SODIUM ER 500 MG PO TB24
ORAL_TABLET | Freq: Every evening | ORAL | 1 refills | Status: DC
  Filled 2022-04-22: qty 60, 30d supply, fill #0

## 2022-02-25 MED ORDER — PRAZOSIN HCL 1 MG PO CAPS: ORAL_CAPSULE | Freq: Every evening | ORAL | 1 refills | Status: DC

## 2022-02-25 MED ORDER — TRAZODONE HCL 50 MG PO TABS: ORAL_TABLET | Freq: Every evening | ORAL | 1 refills | Status: DC

## 2022-02-25 MED ORDER — DULOXETINE HCL 30 MG PO CPEP: ORAL_CAPSULE | Freq: Every day | ORAL | 1 refills | Status: DC

## 2022-03-27 ENCOUNTER — Ambulatory Visit: Payer: Self-pay | Admitting: Gerontology

## 2022-04-10 ENCOUNTER — Other Ambulatory Visit: Payer: Self-pay

## 2022-04-10 ENCOUNTER — Ambulatory Visit: Payer: Self-pay | Admitting: Gerontology

## 2022-04-10 ENCOUNTER — Encounter: Payer: Self-pay | Admitting: Gerontology

## 2022-04-10 VITALS — BP 105/69 | HR 98 | Temp 98.3°F | Ht 63.0 in | Wt 139.9 lb

## 2022-04-10 DIAGNOSIS — R0602 Shortness of breath: Secondary | ICD-10-CM

## 2022-04-10 DIAGNOSIS — K2100 Gastro-esophageal reflux disease with esophagitis, without bleeding: Secondary | ICD-10-CM

## 2022-04-10 DIAGNOSIS — Z0000 Encounter for general adult medical examination without abnormal findings: Secondary | ICD-10-CM

## 2022-04-10 DIAGNOSIS — Z889 Allergy status to unspecified drugs, medicaments and biological substances status: Secondary | ICD-10-CM

## 2022-04-10 MED ORDER — LORATADINE 10 MG PO TABS
ORAL_TABLET | Freq: Every day | ORAL | 1 refills | Status: DC
  Filled 2022-04-10: qty 90, 90d supply, fill #0

## 2022-04-10 MED ORDER — PANTOPRAZOLE SODIUM 40 MG PO TBEC
DELAYED_RELEASE_TABLET | Freq: Every day | ORAL | 1 refills | Status: DC
  Filled 2022-04-10: qty 90, 90d supply, fill #0

## 2022-04-10 MED ORDER — ALBUTEROL SULFATE HFA 108 (90 BASE) MCG/ACT IN AERS
INHALATION_SPRAY | RESPIRATORY_TRACT | 2 refills | Status: DC
  Filled 2022-04-10: qty 6.7, 50d supply, fill #0

## 2022-04-10 NOTE — Progress Notes (Signed)
? ?Established Patient Office Visit ? ?Subjective   ?Patient ID: Kara Lawrence, female    DOB: Mar 07, 1976  Age: 46 y.o. MRN: [id number] ? ?Chief Complaint  ?Patient presents with  ? Follow-up  ?  Follow-up. No new concerns. Shoulder pain is better.   ? ? ?HPI ? ?Kara Lawrence  is a 46 year old female with PMH significant for GERD, biploar, allergies, and MDD presents for follow up visit and medication refill. She states that her right shoulder pain has resolved.  She states that she's compliant with her medications, denies side effects and continues to make healthy lifestyle changes. She states that her mood is good, denies suicidal nor homicidal ideation and continues to follow up at [facility]. Overall, she states that she's doing well and offers no further complaint. ? ?Review of Systems  ?Constitutional: Negative.   ?Respiratory: Negative.    ?Cardiovascular: Negative.   ?Skin: Negative.   ?Neurological: Negative.   ?Psychiatric/Behavioral: Negative.    ? ?  ?Objective:  ?  ? ?BP 105/69 (BP Location: Right Arm, Patient Position: Sitting, Cuff Size: Large)   Pulse 98   Temp 98.3 ?F (36.8 ?C) (Oral)   Ht 5' 3"  (1.6 m)   Wt 139 lb 14.4 oz (63.5 kg)   LMP 10/25/2019 (Approximate)   SpO2 95%   BMI 24.78 kg/m?  ?BP Readings from Last 3 Encounters:  ?04/10/22 105/69  ?01/09/22 118/84  ?12/24/21 (!) 118/91  ? ?Wt Readings from Last 3 Encounters:  ?04/10/22 139 lb 14.4 oz (63.5 kg)  ?01/09/22 137 lb 3.2 oz (62.2 kg)  ?12/24/21 150 lb (68 kg)  ? ?  ? ?Physical Exam ?HENT:  ?   Head: Normocephalic and atraumatic.  ?Cardiovascular:  ?   Rate and Rhythm: Normal rate and regular rhythm.  ?   Pulses: Normal pulses.  ?   Heart sounds: Normal heart sounds.  ?Pulmonary:  ?   Effort: Pulmonary effort is normal.  ?   Breath sounds: Normal breath sounds.  ?Skin: ?   General: Skin is warm.  ?Neurological:  ?   General: No focal deficit present.  ?   Mental Status: She is alert and oriented to person, place, and  time. Mental status is at baseline.  ?Psychiatric:     ?   Mood and Affect: Mood normal.     ?   Behavior: Behavior normal.     ?   Thought Content: Thought content normal.     ?   Judgment: Judgment normal.  ? ? ? ?No results found for any visits on 04/10/22. ? ?Last CBC ?Lab Results  ?Component Value Date  ? WBC 3.6 07/24/2021  ? HGB 12.5 07/24/2021  ? HCT 38.3 07/24/2021  ? MCV 76 (L) 07/24/2021  ? MCH 25.0 (L) 07/24/2021  ? RDW 15.1 07/24/2021  ? PLT 352 07/24/2021  ? ?Last metabolic panel ?Lab Results  ?Component Value Date  ? GLUCOSE 98 07/24/2021  ? NA 138 07/24/2021  ? K 3.8 07/24/2021  ? CL 102 07/24/2021  ? CO2 22 07/24/2021  ? BUN 4 (L) 07/24/2021  ? CREATININE 0.68 07/24/2021  ? EGFR 108 07/24/2021  ? CALCIUM 9.6 07/24/2021  ? PROT 7.6 07/24/2021  ? ALBUMIN 4.0 07/24/2021  ? LABGLOB 3.6 07/24/2021  ? AGRATIO 1.1 (L) 07/24/2021  ? BILITOT 0.4 07/24/2021  ? ALKPHOS 88 07/24/2021  ? AST 79 (H) 07/24/2021  ? ALT 56 (H) 07/24/2021  ? ANIONGAP 23 (H) 07/19/2019  ? ?Last  lipids ?Lab Results  ?Component Value Date  ? CHOL 135 06/01/2019  ? HDL 63 06/01/2019  ? [place] 61 06/01/2019  ? TRIG 53 06/01/2019  ? CHOLHDL 2.1 06/01/2019  ? ?Last hemoglobin A1c ?Lab Results  ?Component Value Date  ? HGBA1C 4.9 02/23/2019  ? ?Last thyroid functions ?Lab Results  ?Component Value Date  ? TSH 3.190 02/23/2019  ? ?Last vitamin D ?No results found for: 25OHVITD2, [place], VD25OH ?Last vitamin B12 and Folate ?No results found for: VITAMINB12, FOLATE ?  ? ?The 10-year ASCVD risk score (Arnett DK, et al., 2019) is: 0.3% ? ?  ?Assessment & Plan:  ? ?1. History of allergy ?- Her seasonal allergy is under control, she will continue on current medication. ?- loratadine (CLARITIN) 10 MG tablet; TAKE 1 TABLET BY MOUTH ONCE EVERY DAY.  Dispense: 90 tablet; Refill: 1 ? ?2. SOB (shortness of breath) on exertion ?- Her breathing is stable, will continue on current medication as needed. ?- albuterol (PROVENTIL HFA) 108 (90 Base) MCG/ACT  inhaler; INHALE 1 PUFF INTO THE LUNGS ONCE EVERY 6 HOURS AS NEEDED FOR WHEEZING OR SHORTNESS OF BREATH  Dispense: 6.7 g; Refill: 2 ? ?3. Gastroesophageal reflux disease with esophagitis ?- Her acid reflux is under control, she will continue on current medication. ?-Avoid spicy, fatty and fried food ?-Avoid sodas and sour juices ?-Avoid heavy meals ?-Avoid eating 4 hours before bedtime ?-Elevate head of bed at night ?- pantoprazole (PROTONIX) 40 MG tablet; TAKE 1 TABLET BY MOUTH ONCE EVERY DAY.  Dispense: 90 tablet; Refill: 1 ? ?4. Health care maintenance ?- Routine labs will be checked. ?- CBC w/Diff; Future ?- Comp Met (CMET); Future ?- Lipid panel; Future ?- HgB A1c; Future ? ? ? ?Return in about 4 months (around 07/30/2022), or if symptoms worsen or fail to improve.  ? ? ?Iyonnah Ferrante Jerold Coombe, NP ? ?

## 2022-04-17 ENCOUNTER — Other Ambulatory Visit: Payer: Self-pay

## 2022-04-22 ENCOUNTER — Other Ambulatory Visit: Payer: Self-pay

## 2022-04-22 MED ORDER — TRAZODONE HCL 150 MG PO TABS
ORAL_TABLET | ORAL | 1 refills | Status: DC
  Filled 2022-04-22: qty 30, 30d supply, fill #0

## 2022-04-22 MED ORDER — DULOXETINE HCL 30 MG PO CPEP
ORAL_CAPSULE | Freq: Every day | ORAL | 1 refills | Status: DC
  Filled 2022-04-22: qty 30, 30d supply, fill #0

## 2022-04-22 MED ORDER — DIVALPROEX SODIUM ER 500 MG PO TB24: 1000.0000 mg | ORAL_TABLET | Freq: Every day | ORAL | 1 refills | Status: DC

## 2022-04-22 MED ORDER — PRAZOSIN HCL 1 MG PO CAPS
ORAL_CAPSULE | Freq: Every evening | ORAL | 1 refills | Status: DC
  Filled 2022-04-22: qty 60, 30d supply, fill #0

## 2022-05-21 ENCOUNTER — Encounter: Payer: Self-pay | Admitting: Medical Oncology

## 2022-05-21 ENCOUNTER — Other Ambulatory Visit: Payer: Self-pay

## 2022-05-21 ENCOUNTER — Emergency Department
Admission: EM | Admit: 2022-05-21 | Discharge: 2022-05-21 | Disposition: A | Discharge status: Home / Self Care | Payer: Self-pay | Attending: Emergency Medicine | Admitting: Emergency Medicine

## 2022-05-21 ENCOUNTER — Other Ambulatory Visit: Payer: Self-pay | Admitting: Pharmacy Technician

## 2022-05-21 DIAGNOSIS — K0381 Cracked tooth: Secondary | ICD-10-CM | POA: Insufficient documentation

## 2022-05-21 DIAGNOSIS — K047 Periapical abscess without sinus: Secondary | ICD-10-CM | POA: Insufficient documentation

## 2022-05-21 MED ORDER — AMOXICILLIN-POT CLAVULANATE 875-125 MG PO TABS
1.0000 | ORAL_TABLET | Freq: Two times a day (BID) | ORAL | 0 refills | Status: AC
  Filled 2022-05-21: qty 14, 7d supply, fill #0

## 2022-05-21 NOTE — Patient Outreach (Signed)
Patient has prescription drug coverage with [employer].  No longer meets the eligibility criteria for free medications.  Kara Lawrence Patient Advocate Specialist [place]

## 2022-05-21 NOTE — ED Notes (Signed)
dc ppw provided. followup and rx information reviewed as applicable. Pt questions addressed at this time. Pt declines VS at time of DC and provides verbal consent for DC. Pt ambulatory to lobby alert and oriented. 

## 2022-05-21 NOTE — Discharge Instructions (Addendum)
Return to the ER for fevers, worsening swelling or any other concerns.  Important you follow-up with a dentist to have this tooth extracted-   Kara Lawrence behind the hospital is the new location of medication management.    OPTIONS FOR DENTAL FOLLOW UP CARE  [place] [facility] and [place] [email or web address] [phone])  [facility] [phone])  [place] [phone])  [place] [phone])  [facility] [phone]) This clinic caters to the indigent population and is on a lottery system. Location: [employer] [facility], [place], [place], [place] Clinic Hours: Wednesdays from 6pm - 9pm, patients seen by a lottery system. For dates, call or go to [email or web address] Services: Cleanings, fillings and simple extractions. Payment Options: DENTAL WORK IS FREE OF CHARGE. Bring proof of income or support. Best way to get seen: Arrive at 5:15 pm - this is a lottery, NOT first come/first serve, so arriving earlier will not increase your chances of being seen.     [facility] [phone] Select option 1 for emergencies   Location: [facility], [place], [address], [place] Clinic Hours: No walk-ins accepted - call the day before to schedule an appointment. Check in times are 9:30 am and 1:30 pm. Services: Simple extractions, temporary fillings, pulpectomy/pulp debridement, uncomplicated abscess drainage. Payment Options: PAYMENT IS DUE AT THE TIME OF SERVICE.  Fee is usually $100-200, additional surgical procedures (e.g. abscess drainage) may be extra. Cash, checks, Visa/MasterCard accepted.  Can file Medicaid if patient is covered for dental - patient should call case worker to check. No discount  for [facility] patients. Best way to get seen: MUST call the day before and get onto the schedule. Can usually be seen the next 1-2 days. No walk-ins accepted.     [place] [phone]   Location: [place], [place] Clinic Hours: M, W, Th, F 8am or 1:30pm, Tues 9a or 1:30 - first come/first served. Services: Simple extractions, temporary fillings, uncomplicated abscess drainage.  You do not need to be an [facility] resident. Payment Options: PAYMENT IS DUE AT THE TIME OF SERVICE. Dental insurance, otherwise sliding scale - bring proof of income or support. Depending on income and treatment needed, cost is usually $50-200. Best way to get seen: Arrive early as it is first come/first served.     [place] [phone]   Location: [place] [facility] Hours: Mon-Thu 8a-5p Services: Most basic dental services including extractions and fillings. Payment Options: PAYMENT IS DUE AT THE TIME OF SERVICE. Sliding scale, up to 50% off - bring proof if income or support. Medicaid with dental option accepted. Best way to get seen: Call to schedule an appointment, can usually be seen within 2 weeks OR they will try to see walk-ins - show up at [place] or 2p (you may have to wait).     [facility] [place] RESIDENTS ONLY   Location: [facility], [place] [address] [place], [place] [address] Clinic Hours: By appointment only. Monday - Thursday 8am-5pm, Friday 8am-12pm Services: Cleanings, fillings, extractions. Payment Options: PAYMENT IS DUE AT THE TIME OF SERVICE. Cash, Visa or MasterCard. Sliding scale - $30 minimum per service. Best way to get seen: Come in to office, complete packet and make an appointment -  need proof of income or support monies for each household member and proof of [facility] residence. Usually  takes about a month to get in.     [facility] [phone]   Location: [address], [place] Clinic Hours: Walk-in Urgent Care Dental Services are offered Monday-Friday mornings only. The numbers of emergencies accepted daily is limited to the number of providers available. Maximum 15 - Mondays, Wednesdays & Thursdays Maximum 10 - Tuesdays & Fridays Services: You do not need to be a [facility] resident to be seen for a dental emergency. Emergencies are defined as pain, swelling, abnormal bleeding, or dental trauma. Walkins will receive x-rays if needed. NOTE: Dental cleaning is not an emergency. Payment Options: PAYMENT IS DUE AT THE TIME OF SERVICE. Minimum co-pay is $40.00 for uninsured patients. Minimum co-pay is $3.00 for Medicaid with dental coverage. Dental Insurance is accepted and must be presented at time of visit. Medicare does not cover dental. Forms of payment: Cash, credit card, checks. Best way to get seen: If not previously registered with the clinic, walk-in dental registration begins at 7:15 am and is on a first come/first serve basis. If previously registered with the clinic, call to make an appointment.     [facility] [place] ONLY   Location: [address] [place], [place] Clinic Hours: Mon-Thu 10a-2p Services: Extractions only! Payment Options: FREE (donations accepted) - bring proof of income or support Best way to get seen: Call and schedule an appointment OR come at 8am on the 1st Monday of every month (except for holidays) when it is first come/first served.     [facility] [phone]   Location: [place], [place] Clinic Hours: Friday mornings Services, Payment Options, Best way to get seen: Call for info

## 2022-05-21 NOTE — ED Provider Notes (Signed)
   [facility] Provider Note    Event Date/Time   First MD Initiated Contact with Patient 05/21/22 1101     (approximate)   History   Dental Pain   HPI   Kara Lawrence is a 46 y.o. female  who comes in with dental pain.  Patient reports having some pain on the bottom right side of her mouth with a little bit of swelling noted.  She reports that its been constant, nothing makes it better or worse.  Denies any recent antibiotics for this.  Patient reports that she has no money to go see a Education officer, community.  She denies any fevers or other symptoms.   I reviewed patient's primary care note from 4/27 where she has a history of GERD, bipolar, allergies and was seen for right shoulder pain.       Physical Exam   Triage Vital Signs: ED Triage Vitals [05/21/22 1025]  Enc Vitals Group     BP 108/88     Pulse Rate 95     Resp 18     Temp 97.8 F (36.6 C)     Temp Source Oral     SpO2 96 %     Weight 140 lb (63.5 kg)     Height 5\' 1"  (1.549 m)     Head Circumference      Peak Flow      Pain Score 0     Pain Loc      Pain Edu?      Excl. in GC?     Most recent vital signs: Vitals:   05/21/22 1025  BP: 108/88  Pulse: 95  Resp: 18  Temp: 97.8 F (36.6 C)  SpO2: 96%     General: Awake, no distress.  CV:  Good peripheral perfusion.  Resp:  Normal effort.  Abd:  No distention.  Other:  Patient has very minimal swelling noted with a broken right lower molar tooth with without any evidence of any fluctuation amenable to drainage.  The bottom of her mouth is soft.  She is got full range of motion of neck.  She speaking in full sentences.  ED Results / Procedures / Treatments   IMPRESSION / MDM / ASSESSMENT AND PLAN / ED COURSE  I reviewed the triage vital signs and the nursing notes.  Differential includes pulpitis, abscess, no evidence of ludwigs.  I discussed with patient that if there is probably beginning of an abscess and that if there is  an abscess that is an I&D is recommended but she states that she has a phobia of needles and does not want to have an I&D.  However per my examination I really do not even feel any fluctuation that could be amenable to I&D so we discussed antibiotics for now and the importance of following up with a dental service for extraction of the broken tooth.  She expressed understanding and understands return if the swelling gets worse and then I&D would be recommended at that time   FINAL CLINICAL IMPRESSION(S) / ED DIAGNOSES   Final diagnoses:  Dental abscess     Rx / DC Orders   ED Discharge Orders     None        Note:  This document was prepared using Dragon voice recognition software and may include unintentional dictation errors.   07/21/22, MD 05/21/22 [id number]

## 2022-05-21 NOTE — ED Triage Notes (Signed)
Rt sided Dental pain x 3 days

## 2022-06-30 ENCOUNTER — Other Ambulatory Visit (HOSPITAL_COMMUNITY): Payer: Self-pay

## 2022-07-01 ENCOUNTER — Other Ambulatory Visit: Payer: Self-pay

## 2022-07-01 MED ORDER — DULOXETINE HCL 30 MG PO CPEP
ORAL_CAPSULE | ORAL | 1 refills | Status: DC
  Filled 2022-07-01: qty 30, 30d supply, fill #0

## 2022-07-01 MED ORDER — TRAZODONE HCL 150 MG PO TABS
ORAL_TABLET | ORAL | 1 refills | Status: DC
  Filled 2022-07-01: qty 30, 30d supply, fill #0

## 2022-07-01 MED ORDER — PROPRANOLOL HCL 10 MG PO TABS
ORAL_TABLET | ORAL | 1 refills | Status: DC
  Filled 2022-07-01: qty 60, 30d supply, fill #0

## 2022-07-01 MED ORDER — DIVALPROEX SODIUM ER 500 MG PO TB24
ORAL_TABLET | ORAL | 1 refills | Status: DC
  Filled 2022-07-01: qty 60, 30d supply, fill #0

## 2022-07-02 ENCOUNTER — Other Ambulatory Visit: Payer: Self-pay

## 2022-07-03 ENCOUNTER — Other Ambulatory Visit: Payer: Self-pay

## 2022-07-07 ENCOUNTER — Other Ambulatory Visit: Payer: Self-pay

## 2022-07-23 ENCOUNTER — Other Ambulatory Visit: Payer: Self-pay

## 2022-07-30 ENCOUNTER — Ambulatory Visit: Payer: Self-pay | Admitting: Gerontology

## 2022-08-06 ENCOUNTER — Other Ambulatory Visit: Payer: Self-pay

## 2022-08-06 DIAGNOSIS — Z0000 Encounter for general adult medical examination without abnormal findings: Secondary | ICD-10-CM

## 2022-08-08 LAB — LIPID PANEL
Chol/HDL Ratio: 2.4 ratio (ref 0.0–4.4)
Cholesterol, Total: 121 mg/dL (ref 100–199)
HDL: 50 mg/dL (ref >39)
LDL Chol Calc (NIH): 60 mg/dL (ref 0–99)
Triglycerides: 46 mg/dL (ref 0–149)
VLDL Cholesterol Cal: 11 mg/dL (ref 5–40)

## 2022-08-08 LAB — CBC WITH DIFFERENTIAL/PLATELET
Basophils Absolute: 0 10*3/uL (ref 0.0–0.2)
Basos: 0 %
EOS (ABSOLUTE): 0 10*3/uL (ref 0.0–0.4)
Eos: 1 %
Hematocrit: 37.1 % (ref 34.0–46.6)
Hemoglobin: 12.7 g/dL (ref 11.1–15.9)
Immature Grans (Abs): 0 10*3/uL (ref 0.0–0.1)
Immature Granulocytes: 0 %
Lymphocytes Absolute: 2.6 10*3/uL (ref 0.7–3.1)
Lymphs: 50 %
MCH: 25.7 pg — ABNORMAL LOW (ref 26.6–33.0)
MCHC: 34.2 g/dL (ref 31.5–35.7)
MCV: 75 fL — ABNORMAL LOW (ref 79–97)
Monocytes Absolute: 0.7 10*3/uL (ref 0.1–0.9)
Monocytes: 14 %
Neutrophils Absolute: 1.8 10*3/uL (ref 1.4–7.0)
Neutrophils: 35 %
Platelets: 294 10*3/uL (ref 150–450)
RBC: 4.94 x10E6/uL (ref 3.77–5.28)
RDW: 13.7 % (ref 11.7–15.4)
WBC: 5.1 10*3/uL (ref 3.4–10.8)

## 2022-08-08 LAB — COMPREHENSIVE METABOLIC PANEL
ALT: 32 IU/L (ref 0–32)
AST: 35 IU/L (ref 0–40)
Albumin/Globulin Ratio: 1.3 (ref 1.2–2.2)
Albumin: 4.3 g/dL (ref 3.9–4.9)
Alkaline Phosphatase: 89 IU/L (ref 44–121)
BUN/Creatinine Ratio: 17 (ref 9–23)
BUN: 12 mg/dL (ref 6–24)
Bilirubin Total: 0.3 mg/dL (ref 0.0–1.2)
CO2: 22 mmol/L (ref 20–29)
Calcium: 9.4 mg/dL (ref 8.7–10.2)
Chloride: 100 mmol/L (ref 96–106)
Creatinine, Ser: 0.71 mg/dL (ref 0.57–1.00)
Globulin, Total: 3.3 g/dL (ref 1.5–4.5)
Glucose: 80 mg/dL (ref 70–99)
Potassium: 4.3 mmol/L (ref 3.5–5.2)
Sodium: 138 mmol/L (ref 134–144)
Total Protein: 7.6 g/dL (ref 6.0–8.5)
eGFR: 105 mL/min/{1.73_m2} (ref >59)

## 2022-08-08 LAB — HEMOGLOBIN A1C
Est. average glucose Bld gHb Est-mCnc: 103 mg/dL
Hgb A1c MFr Bld: 5.2 % (ref 4.8–5.6)

## 2022-08-13 ENCOUNTER — Ambulatory Visit: Payer: Self-pay | Admitting: Gerontology

## 2022-10-07 ENCOUNTER — Encounter: Payer: Self-pay | Admitting: Gerontology

## 2022-10-07 ENCOUNTER — Ambulatory Visit: Payer: Self-pay | Admitting: Gerontology

## 2022-10-07 ENCOUNTER — Other Ambulatory Visit: Payer: Self-pay

## 2022-10-07 VITALS — BP 114/80 | HR 94 | Wt 143.0 lb

## 2022-10-07 DIAGNOSIS — Z889 Allergy status to unspecified drugs, medicaments and biological substances status: Secondary | ICD-10-CM

## 2022-10-07 DIAGNOSIS — K2100 Gastro-esophageal reflux disease with esophagitis, without bleeding: Secondary | ICD-10-CM

## 2022-10-07 DIAGNOSIS — R0602 Shortness of breath: Secondary | ICD-10-CM

## 2022-10-07 DIAGNOSIS — R051 Acute cough: Secondary | ICD-10-CM

## 2022-10-07 DIAGNOSIS — Z0000 Encounter for general adult medical examination without abnormal findings: Secondary | ICD-10-CM

## 2022-10-07 MED ORDER — LORATADINE 10 MG PO TABS
ORAL_TABLET | Freq: Every day | ORAL | 1 refills | Status: DC
  Filled 2022-10-07: qty 30, 30d supply, fill #0
  Filled 2022-10-10: qty 90, 90d supply, fill #0
  Filled 2022-12-04 – 2023-03-12 (×2): qty 30, 30d supply, fill #1

## 2022-10-07 MED ORDER — BENZONATATE 100 MG PO CAPS
100.0000 mg | ORAL_CAPSULE | Freq: Three times a day (TID) | ORAL | 0 refills | Status: DC | PRN
  Filled 2022-10-07: qty 20, 7d supply, fill #0

## 2022-10-07 MED ORDER — PANTOPRAZOLE SODIUM 40 MG PO TBEC
DELAYED_RELEASE_TABLET | Freq: Every day | ORAL | 1 refills | Status: DC
  Filled 2022-10-07: qty 22, 22d supply, fill #0
  Filled 2022-10-07: qty 8, 8d supply, fill #0
  Filled 2022-11-19: qty 30, 30d supply, fill #1
  Filled 2022-12-04: qty 30, 30d supply, fill #2
  Filled 2023-01-13 – 2023-01-15 (×2): qty 30, 30d supply, fill #3

## 2022-10-07 MED ORDER — TRAZODONE HCL 150 MG PO TABS
ORAL_TABLET | ORAL | 2 refills | Status: DC
  Filled 2022-10-07: qty 30, 30d supply, fill #0

## 2022-10-07 MED ORDER — ALBUTEROL SULFATE HFA 108 (90 BASE) MCG/ACT IN AERS
INHALATION_SPRAY | RESPIRATORY_TRACT | 2 refills | Status: DC
  Filled 2022-10-07: qty 6.7, 30d supply, fill #0

## 2022-10-07 MED ORDER — DIVALPROEX SODIUM ER 500 MG PO TB24
1000.0000 mg | ORAL_TABLET | Freq: Every evening | ORAL | 2 refills | Status: DC
  Filled 2022-10-07: qty 60, 30d supply, fill #0

## 2022-10-07 MED ORDER — DULOXETINE HCL 30 MG PO CPEP
ORAL_CAPSULE | ORAL | 2 refills | Status: DC
  Filled 2022-10-07: qty 30, 30d supply, fill #0

## 2022-10-07 MED ORDER — PROPRANOLOL HCL 10 MG PO TABS
ORAL_TABLET | ORAL | 2 refills | Status: DC
  Filled 2022-10-07: qty 60, 30d supply, fill #0
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
## 2022-10-08 ENCOUNTER — Other Ambulatory Visit: Payer: Self-pay

## 2022-10-08 ENCOUNTER — Telehealth: Payer: Self-pay | Admitting: Emergency Medicine

## 2022-10-08 NOTE — Telephone Encounter (Signed)
Called patient to advise that she has active [employer] (id# [id number]) per [place]. Patient states she knows nothing about the insurance. I tried to explain that at some point insurance may have been applied for even over the phone. Unfortunately, she is no longer eligible for care at the [facility]. Patient hung up on me.

## 2022-10-09 ENCOUNTER — Other Ambulatory Visit: Payer: Self-pay

## 2022-10-10 ENCOUNTER — Other Ambulatory Visit: Payer: Self-pay

## 2022-10-13 ENCOUNTER — Other Ambulatory Visit: Payer: Self-pay

## 2022-10-22 ENCOUNTER — Other Ambulatory Visit: Payer: Self-pay

## 2022-11-19 ENCOUNTER — Emergency Department
Admission: EM | Admit: 2022-11-19 | Discharge: 2022-11-19 | Disposition: A | Discharge status: Home / Self Care | Payer: Self-pay | Attending: Emergency Medicine | Admitting: Emergency Medicine

## 2022-11-19 ENCOUNTER — Other Ambulatory Visit: Payer: Self-pay

## 2022-11-19 ENCOUNTER — Emergency Department: Payer: Self-pay

## 2022-11-19 DIAGNOSIS — J40 Bronchitis, not specified as acute or chronic: Secondary | ICD-10-CM | POA: Insufficient documentation

## 2022-11-19 DIAGNOSIS — Z1152 Encounter for screening for COVID-19: Secondary | ICD-10-CM | POA: Insufficient documentation

## 2022-11-19 LAB — RESP PANEL BY RT-PCR (FLU A&B, COVID) ARPGX2
Influenza A by PCR: NEGATIVE
Influenza B by PCR: NEGATIVE
SARS Coronavirus 2 by RT PCR: NEGATIVE

## 2022-11-19 MED ORDER — BENZONATATE 100 MG PO CAPS
100.0000 mg | ORAL_CAPSULE | Freq: Three times a day (TID) | ORAL | 0 refills | Status: DC | PRN
  Filled 2022-11-19: qty 30, 10d supply, fill #0

## 2022-11-19 MED ORDER — PREDNISONE 50 MG PO TABS
50.0000 mg | ORAL_TABLET | Freq: Every day | ORAL | 0 refills | Status: AC
  Filled 2022-11-19: qty 5, 5d supply, fill #0

## 2022-11-19 MED ORDER — ALBUTEROL SULFATE HFA 108 (90 BASE) MCG/ACT IN AERS
2.0000 | INHALATION_SPRAY | Freq: Four times a day (QID) | RESPIRATORY_TRACT | 2 refills | Status: DC | PRN
  Filled 2022-11-19: qty 6.7, 25d supply, fill #0

## 2022-11-19 NOTE — Discharge Instructions (Addendum)
-  I suspect that you likely have bronchitis, caused by a virus.  Please review the educational material provided.  -Please take the full course of the prednisone as prescribed.  You may additionally take the benzonatate as needed for your cough.  You may take the albuterol inhaler as needed for your shortness of breath/cough.  -Follow-up with your primary care provider or return here if your symptoms persist beyond 10 days.  -Turn to the emergency department anytime if you begin to experience any new or worsening symptoms.

## 2022-11-19 NOTE — ED Triage Notes (Signed)
Pt comes with c/o clear productive cough that started over the weekend. Pt denies any fevers or other symptoms.

## 2022-11-19 NOTE — ED Provider Notes (Signed)
[facility] Provider Note    Event Date/Time   First MD Initiated Contact with Patient 11/19/22 1230     (approximate)   History   Chief Complaint Cough   HPI  Kara Lawrence is a 46 y.o. female, history of alcohol use disorder, bipolar 1 disorder, polysubstance use, GERD, seizures, presents to the emergency department for evaluation of cough/congestion.  She states that she has been feeling unwell for the past 5 to 6 days and does not appear to be improving.  She reports a productive cough and bodyaches.  Denies chest pain, shortness of breath, abdominal pain, flank pain, nausea/vomiting, diarrhea, dysuria, headache, weakness, dizziness/lightheadedness, or rash/lesions.  History Limitations: No limitations.        Physical Exam  Triage Vital Signs: ED Triage Vitals  Enc Vitals Group     BP 11/19/22 1129 122/88     Pulse Rate 11/19/22 1129 81     Resp 11/19/22 1129 20     Temp 11/19/22 1129 98.3 F (36.8 C)     Temp Source 11/19/22 1129 Oral     SpO2 11/19/22 1129 95 %     Weight --      Height --      Head Circumference --      Peak Flow --      Pain Score 11/19/22 1107 2     Pain Loc --      Pain Edu? --      Excl. in GC? --     Most recent vital signs: Vitals:   11/19/22 1129  BP: 122/88  Pulse: 81  Resp: 20  Temp: 98.3 F (36.8 C)  SpO2: 95%    General: Awake, NAD.  Audible cough and congestion in the room. Skin: Warm, dry. No rashes or lesions.  Eyes: PERRL. Conjunctivae normal.  CV: Good peripheral perfusion.  Resp: Normal effort.  Lung sounds clear bilaterally.  Throat exam unremarkable. Abd: Soft, non-tender. No distention.  Neuro: At baseline. No gross neurological deficits.  Musculoskeletal: Normal ROM of all extremities.   Physical Exam    ED Results / Procedures / Treatments  Labs (all labs ordered are listed, but only abnormal results are displayed) Labs Reviewed  RESP PANEL BY RT-PCR (FLU A&B,  COVID) [id number]     EKG N/A.    RADIOLOGY  ED Provider Interpretation: I personally reviewed and interpreted this x-ray, no evidence of acute cardiopulmonary abnormalities.  DG Chest 2 View  Result Date: 11/19/2022 CLINICAL DATA:  Productive cough EXAM: CHEST - 2 VIEW COMPARISON:  Chest x-ray dated December 20, 2020 FINDINGS: The heart size and mediastinal contours are within normal limits. Both lungs are clear. The visualized skeletal structures are unremarkable. IMPRESSION: Lungs are clear. Electronically Signed   By: Allegra Lai M.D.   On: 11/19/2022 13:24    PROCEDURES:  Critical Care performed: N/A.  Procedures    MEDICATIONS ORDERED IN ED: Medications - No data to display   IMPRESSION / MDM / ASSESSMENT AND PLAN / ED COURSE  I reviewed the triage vital signs and the nursing notes.                              Differential diagnosis includes, but is not limited to, community-acquired pneumonia, influenza, COVID-19, sinusitis, viral URI, allergic rhinitis  Assessment/Plan Patient presents with cough/congestion x 5 days.  Audible cough and congestion in the room, but otherwise no significant physical  exam findings.  Respiratory panel negative for COVID-19 or influenza.  Her chest x-ray does not show any acute cardiopulmonary abnormalities.  I suspect that she likely has bronchitis, most likely secondary to a viral etiology.  Will provide her with symptom management, to include benzonatate, prednisone, and albuterol inhaler.  Recommend that she follow-up with her primary care provider if her symptoms fail to improve after 10 days.  She was amenable to this plan.  Will discharge.  Provided the patient with anticipatory guidance, return precautions, and educational material. Encouraged the patient to return to the emergency department at any time if they begin to experience any new or worsening symptoms. Patient expressed understanding and agreed with the plan.   Patient's  presentation is most consistent with acute complicated illness / injury requiring diagnostic workup.       FINAL CLINICAL IMPRESSION(S) / ED DIAGNOSES   Final diagnoses:  Bronchitis     Rx / DC Orders   ED Discharge Orders          Ordered    albuterol (VENTOLIN HFA) 108 (90 Base) MCG/ACT inhaler  Every 6 hours PRN        11/19/22 1413    predniSONE (DELTASONE) 50 MG tablet  Daily with breakfast        11/19/22 1413    benzonatate (TESSALON PERLES) 100 MG capsule  3 times daily PRN        11/19/22 1413             Note:  This document was prepared using Dragon voice recognition software and may include unintentional dictation errors.   Varney Daily, [place] 11/19/22 1415    Minna Antis, MD 11/19/22 1510

## 2022-12-04 ENCOUNTER — Other Ambulatory Visit: Payer: Self-pay

## 2022-12-16 ENCOUNTER — Other Ambulatory Visit: Payer: Self-pay

## 2022-12-18 ENCOUNTER — Other Ambulatory Visit: Payer: Self-pay

## 2022-12-23 ENCOUNTER — Other Ambulatory Visit: Payer: Self-pay

## 2022-12-23 ENCOUNTER — Emergency Department
Admission: EM | Admit: 2022-12-23 | Discharge: 2022-12-23 | Disposition: A | Discharge status: Home / Self Care | Payer: 59 | Attending: Emergency Medicine | Admitting: Emergency Medicine

## 2022-12-23 DIAGNOSIS — U071 COVID-19: Secondary | ICD-10-CM | POA: Diagnosis not present

## 2022-12-23 DIAGNOSIS — R059 Cough, unspecified: Secondary | ICD-10-CM | POA: Diagnosis present

## 2022-12-23 LAB — RESP PANEL BY RT-PCR (RSV, FLU A&B, COVID)  RVPGX2
Influenza A by PCR: NEGATIVE
Influenza B by PCR: NEGATIVE
Resp Syncytial Virus by PCR: NEGATIVE
SARS Coronavirus 2 by RT PCR: POSITIVE — AB

## 2022-12-23 MED ORDER — ALBUTEROL SULFATE HFA 108 (90 BASE) MCG/ACT IN AERS
2.0000 | INHALATION_SPRAY | Freq: Four times a day (QID) | RESPIRATORY_TRACT | 2 refills | Status: DC | PRN
  Filled 2022-12-23: qty 6.7, 25d supply, fill #0

## 2022-12-23 MED ORDER — PREDNISONE 50 MG PO TABS
50.0000 mg | ORAL_TABLET | Freq: Every day | ORAL | 0 refills | Status: DC
  Filled 2022-12-23: qty 4, 4d supply, fill #0

## 2022-12-23 MED ORDER — BENZONATATE 100 MG PO CAPS
100.0000 mg | ORAL_CAPSULE | Freq: Three times a day (TID) | ORAL | 0 refills | Status: DC | PRN
  Filled 2022-12-23: qty 30, 10d supply, fill #0

## 2022-12-23 NOTE — ED Triage Notes (Signed)
Pt presents to ED with /co of cough for the past week. Pt unsure of positive COVID or FLU exposure.

## 2022-12-23 NOTE — Discharge Instructions (Signed)
Take acetaminophen 650 mg and ibuprofen 400 mg every 6 hours for pain.  Take with food.  Thank you for choosing us for your health care today!  Please see your primary doctor this week for a follow up appointment.   Sometimes, in the early stages of certain disease courses it is difficult to detect in the emergency department evaluation -- so, it is important that you continue to monitor your symptoms and call your doctor right away or return to the emergency department if you develop any new or worsening symptoms.  Please go to the following website to schedule new (and existing) patient appointments:   [email or web address]  If you do not have a primary doctor try calling the following clinics to establish care:  If you have insurance:  [facility] [phone] [address] [place] [address]   [facility]  [phone] [address] [place] [address] [place] [address]   If you do not have insurance:  [facility]  [phone] [address] [place] [address]   The following is another list of primary care offices in the area who are accepting new patients at this time.  Please reach out to one of them directly and let them know you would like to schedule an appointment to follow up on an Emergency Department visit, and/or to establish a new primary care provider (PCP).  There are likely other primary care clinics in the are who are accepting new patients, but this is an excellent place to start:  [facility] Lead physician: Dr Angela Bacigalupo [address] [place], [place] [address] [phone]  [facility] Lead Physician: Dr Krichna Sowles [address], [place], [place] [address] [phone]  [facility]  Lead Physician: Dr Megan Johnson [address], [place], [place] [address] [phone]  [facility] [place] [facility] Lead Physician: Dr Alex  Karamalegos [address], [place], [place] [address] [phone]  [facility] [place] Lead Physician: Dr Laura Berglund [address], [place],  [address] [phone]   It was my pleasure to care for you today.   Jeremaine Maraj S. Eastyn Dattilo, MD  

## 2022-12-23 NOTE — ED Provider Triage Note (Signed)
Emergency Medicine Provider Triage Evaluation Note   Kara Lawrence , a 47 y.o. female  was evaluated in triage.  Pt complains of cough x 1 week. Denies fever or chills.  History of bronchitis. Chest x-ray last month negative.   Review of Systems  Positive: + cough Negative: Denies fever or chills, non-smoker  Physical Exam  BP (!) 126/91 (BP Location: Left Arm)   Pulse 93   Temp (!) 100.6 F (38.1 C) (Oral)   Resp 18   Ht 5\' 2"  (1.575 m)   Wt 64.9 kg   LMP 10/25/2019 (Approximate)   SpO2 94%   BMI 26.16 kg/m  Gen:   Awake, no distress   Resp:  Normal effort  MSK:   Moves extremities without difficulty  Other:    Medical Decision Making  Medically screening exam initiated at 9:44 AM.  Appropriate orders placed.   Meredeth Furber was informed that the remainder of the evaluation will be completed by another provider, this initial triage assessment does not replace that evaluation, and the importance of remaining in the ED until their evaluation is complete.     Johnn Hai, PA-C 12/23/22 [phone]

## 2022-12-23 NOTE — ED Notes (Signed)
Pt is immediately yelling at staff. This RN  advised her that if she would not stop yelling at staff we would have her escorted out.

## 2022-12-23 NOTE — ED Notes (Addendum)
Error: note made in wrong pt's chart.  

## 2022-12-23 NOTE — ED Notes (Addendum)
Pt continues to be disgruntled with staff and demanding to see the doctor. This RN advised she has to wait her turn as she has only been here 2 hours and there are lots in front of her. But redirected at this time to cooperate or go back to the lobby. Pt was given a mask as she is positive for COVID.

## 2022-12-23 NOTE — ED Provider Notes (Signed)
[facility] Provider Note    Event Date/Time   First MD Initiated Contact with Patient 12/23/22 1147     (approximate)   History   Cough   HPI   Kara Lawrence is a 47 y.o. female   Past medical history of bipolar manic depression seizures who presents emergency department with 2 weeks of cough.  No fever chills chest pain sore throat congestion or any other acute medical complaints.  Tolerating p.o. voiding appropriately.    Requests cough medicine that helped with bronchitis that she received last year including steroids and cough suppressant. History was obtained via the patient.      Physical Exam   Triage Vital Signs: ED Triage Vitals  Enc Vitals Group     BP 12/23/22 0940 (!) 126/91     Pulse Rate 12/23/22 0940 93     Resp 12/23/22 0940 18     Temp 12/23/22 0940 (!) 100.6 F (38.1 C)     Temp Source 12/23/22 0940 Oral     SpO2 12/23/22 0940 94 %     Weight 12/23/22 0942 143 lb (64.9 kg)     Height 12/23/22 0942 5\' 2"  (1.575 m)     Head Circumference --      Peak Flow --      Pain Score 12/23/22 0945 0     Pain Loc --      Pain Edu? --      Excl. in GC? --     Most recent vital signs: Vitals:   12/23/22 0940  BP: (!) 126/91  Pulse: 93  Resp: 18  Temp: (!) 100.6 F (38.1 C)  SpO2: 94%    General: Awake, no distress.  CV:  Good peripheral perfusion.  Resp:  Normal effort.  Abd:  No distention.  Other:  Awake alert lungs clear without focality or wheezing low-grade fever 100.6 otherwise hemodynamics appropriate reassuring no hypoxemia no respiratory distress.  Nontoxic appearance.  Neck supple with full range of motion   ED Results / Procedures / Treatments   Labs (all labs ordered are listed, but only abnormal results are displayed) Labs Reviewed  RESP PANEL BY RT-PCR (RSV, FLU A&B, COVID)  [id number] - Abnormal; Notable for the following components:      Result Value   SARS Coronavirus 2 by RT PCR POSITIVE (*)     All other components within normal limits    I reviewed labs and they are notable for + COVID PROCEDURES:  Critical Care performed: No  Procedures   MEDICATIONS ORDERED IN ED: Medications - No data to display    IMPRESSION / MDM / ASSESSMENT AND PLAN / ED COURSE  I reviewed the triage vital signs and the nursing notes.                              Differential diagnosis includes, but is not limited to, bronchitis, asthma exacerbation, viral URI, bacterial pneumonia, sepsis or meningitis    MDM: Patient with COVID and bronchitis only.  Appears well no focality on exam low-grade fever nontoxic appearance doubt more emergent bacterial infection.  I told the patient that she is COVID-positive and she refuses to believe that she has COVID because "I never had it before".  She is vaccinated x 2.  She would like relief of her cough and suggested that prednisone and what ever she received last ED visit worked well.  I prescribed  the same medications with prednisone, albuterol, Tessalon.  I advised that she stay home from work while she is symptomatic but she is hesitant to do so.  She will be discharged at this time, anticipatory guidance given, PMD follow-up.  Return precautions given.  Patient's presentation is most consistent with acute presentation with potential threat to life or bodily function.       FINAL CLINICAL IMPRESSION(S) / ED DIAGNOSES   Final diagnoses:  COVID     Rx / DC Orders   ED Discharge Orders          Ordered    benzonatate (TESSALON PERLES) 100 MG capsule  3 times daily PRN        12/23/22 1232    albuterol (VENTOLIN HFA) 108 (90 Base) MCG/ACT inhaler  Every 6 hours PRN        12/23/22 1232    predniSONE (DELTASONE) 50 MG tablet  Daily        12/23/22 1232             Note:  This document was prepared using Dragon voice recognition software and may include unintentional dictation errors.    Lucillie Garfinkel, MD 12/23/22 1259

## 2023-01-07 ENCOUNTER — Ambulatory Visit: Payer: Self-pay | Admitting: Gerontology

## 2023-01-13 ENCOUNTER — Other Ambulatory Visit: Payer: Self-pay

## 2023-01-15 ENCOUNTER — Other Ambulatory Visit: Payer: Self-pay

## 2023-01-15 MED ORDER — DIVALPROEX SODIUM ER 500 MG PO TB24
1000.0000 mg | ORAL_TABLET | Freq: Every evening | ORAL | 2 refills | Status: DC
  Filled 2023-01-15: qty 60, 30d supply, fill #0

## 2023-01-15 MED ORDER — TRAZODONE HCL 150 MG PO TABS
75.0000 mg | ORAL_TABLET | Freq: Every evening | ORAL | 2 refills | Status: DC | PRN
  Filled 2023-01-15: qty 30, 30d supply, fill #0

## 2023-01-15 MED ORDER — DULOXETINE HCL 30 MG PO CPEP
30.0000 mg | ORAL_CAPSULE | Freq: Every day | ORAL | 2 refills | Status: DC
  Filled 2023-01-15: qty 30, 30d supply, fill #0

## 2023-02-08 NOTE — Progress Notes (Unsigned)
New patient visit   Patient: Kara Lawrence   DOB: [birth year]   47 y.o. Female  MRN: [id number] Visit Date: 02/09/2023  Today's healthcare provider: [name], MD   No chief complaint on file.  Subjective     Kara Lawrence is a 48 y.o. female who presents today as a new patient to establish care.  HPI   Encounter to Establish Care Patient presents visit to establish care with new primary care physician Introduced myself and my role as primary We reviewed patient's medical, surgical, medications and social history additional problems were discussed as detailed below   Seizure -   Bipolar 1 Disorder:     Past Medical History:  Diagnosis Date   Allergy    Bipolar 1 disorder ([place])    Depression    Manic depression ([place])    Dr Randel Books, psychiatrist   Seizure [facility])    Past Surgical History:  Procedure Laterality Date   arm surgery     TUBAL LIGATION Bilateral    Family Status  Relation Name Status   Mother  Alive   Jacona  (Not Specified)   Family History  Problem Relation Age of Onset   Breast cancer Maternal Aunt 67   Social History   Socioeconomic History   Marital status: Single    Spouse name: Not on file   Number of children: 5   Years of education: 8   Highest education level: 8th grade  Occupational History   Not on file  Tobacco Use   Smoking status: Never   Smokeless tobacco: Never   Tobacco comments:    Patient refuses to give any [facility] or answer SDOH question.  Vaping Use   Vaping Use: Never used  Substance and Sexual Activity   Alcohol use: Yes   Drug use: Not Currently    Types: "Crack" cocaine, Benzodiazepines, Cocaine, Marijuana   Sexual activity: Not on file  Other Topics Concern   Not on file  Social History Narrative   Lives in boarding room with boyfriend   Social Determinants of Health   Financial Resource Strain: Not on file  Food Insecurity: Unknown (01/09/2022)   Hunger Vital Sign     Worried About Running Out of Food in the Last Year: Patient refused    [place] in the Last Year: Patient refused  Transportation Needs: Unknown (01/09/2022)   PRAPARE - Hydrologist (Medical): Patient refused    Lack of Transportation (Non-Medical): Patient refused  Physical Activity: Not on file  Stress: Not on file  Social Connections: Socially Isolated (10/07/2022)   Social Connection and Isolation Panel [NHANES]    Frequency of Communication with Friends and Family: More than three times a week    Frequency of Social Gatherings with Friends and Family: Once a week    Attends Religious Services: Never    Marine scientist or Organizations: No    Attends Archivist Meetings: Never    Marital Status: Never married   Outpatient Medications Prior to Visit  Medication Sig   albuterol (VENTOLIN HFA) 108 (90 Base) MCG/ACT inhaler Inhale 2 puffs into the lungs every 6 (six) hours as needed for wheezing or shortness of breath.   benzonatate (TESSALON PERLES) 100 MG capsule Take 1 capsule (100 mg total) by mouth 3 (three) times daily as needed for cough.   divalproex (DEPAKOTE ER) 500 MG 24 hr tablet Take 2 tablets (1,000 mg total)  by mouth once nightly at bedtime.   divalproex (DEPAKOTE ER) 500 MG 24 hr tablet 2 q HS   divalproex (DEPAKOTE ER) 500 MG 24 hr tablet Take 2 tablets (1,000 mg total) by mouth at bedtime.   divalproex (DEPAKOTE ER) 500 MG 24 hr tablet Take 2 tablets (1,000 mg total) by mouth at bedtime.   DULoxetine (CYMBALTA) 30 MG capsule TAKE 1 CAPSULE BY MOUTH ONCE DAILY.   DULoxetine (CYMBALTA) 30 MG capsule Take 1 capsule ('30mg'$  total) by mouth once daily.   DULoxetine (CYMBALTA) 30 MG capsule take one daily -   DULoxetine (CYMBALTA) 30 MG capsule Take one capsule by mouth daily   DULoxetine (CYMBALTA) 30 MG capsule Take 1 capsule (30 mg total) by mouth daily.   loratadine (CLARITIN) 10 MG tablet TAKE 1 TABLET BY MOUTH ONCE  EVERY DAY.   pantoprazole (PROTONIX) 40 MG tablet TAKE 1 TABLET BY MOUTH ONCE EVERY DAY.   prazosin (MINIPRESS) 1 MG capsule Take 2 capsules ('2mg'$  total) by mouth once nightly at bedtime.   predniSONE (DELTASONE) 50 MG tablet Take 1 tablet (50 mg total) by mouth daily for 4 days.   propranolol (INDERAL) 10 MG tablet 1every morning and 1 at night   propranolol (INDERAL) 10 MG tablet Take 1 tablet by mouth every morning and 1 tablet by mouth at night   traZODone (DESYREL) 150 MG tablet Take (1/2) tablet ('75mg'$ ) to 1 tablet ('150mg'$ ) by mouth once nightly at bedtime as needed for sleep. (Take at least 30 minutes before bed).   traZODone (DESYREL) 150 MG tablet 1/2 to 1 q HS prn sleep - take at least 1/2 hour before bed   traZODone (DESYREL) 150 MG tablet Take 1/2 to 1 tablet by mouth at bedtime as needed for sleep - take at least 1/2 hour before bed   traZODone (DESYREL) 150 MG tablet Take 0.5-1 tablets (75-150 mg total) by mouth at bedtime as needed for sleep - take at least 1/2 hour before bed.   No facility-administered medications prior to visit.   No Known Allergies  Immunization History  Administered Date(s) Administered   PFIZER(Purple Top)SARS-COV-2 Vaccination 08/13/2020   Tdap 07/19/2019    Health Maintenance  Topic Date Due   HIV Screening  Never done   COLONOSCOPY (Pts 45-52yr Insurance coverage will need to be confirmed)  Never done   PAP SMEAR-Modifier  06/28/2022   COVID-19 Vaccine (2 - 2023-24 season) 08/15/2022   INFLUENZA VACCINE  03/15/2023 (Originally 07/15/2022)   DTaP/Tdap/Td (2 - Td or Tdap) 07/18/2029   Hepatitis C Screening  Completed   HPV VACCINES  Aged Out    Patient Care Team: Pcp, No as PCP - General LRico Junker RN as Registered Nurse STheodore Demark RN (Inactive) as Registered Nurse  Review of Systems  {Labs  Heme  Chem  Endocrine  Serology  Results Review (optional):23779}   Objective    LMP 10/25/2019 (Approximate)  {Show previous  vital signs (optional):23777}  Physical Exam ***  Depression Screen     No data to display         No results found for any visits on 02/09/23.  Assessment & Plan      Problem List Items Addressed This Visit   None    No follow-ups on file.       The entirety of the information documented in the History of Present Illness, Review of Systems and Physical Exam were personally obtained by me. Portions of this information were  initially documented by *** . I, [name], MD have reviewed the documentation above for thoroughness and accuracy.      [name], MD  [facility] [phone] (phone) [phone] (fax)  [place]

## 2023-02-09 ENCOUNTER — Encounter: Payer: Self-pay | Admitting: Family Medicine

## 2023-02-09 ENCOUNTER — Ambulatory Visit (INDEPENDENT_AMBULATORY_CARE_PROVIDER_SITE_OTHER): Payer: 59 | Admitting: Family Medicine

## 2023-02-09 ENCOUNTER — Other Ambulatory Visit: Payer: Self-pay

## 2023-02-09 VITALS — BP 133/86 | HR 96 | Resp 16 | Ht 61.0 in | Wt 141.0 lb

## 2023-02-09 DIAGNOSIS — K2100 Gastro-esophageal reflux disease with esophagitis, without bleeding: Secondary | ICD-10-CM

## 2023-02-09 DIAGNOSIS — Z7689 Persons encountering health services in other specified circumstances: Secondary | ICD-10-CM | POA: Diagnosis not present

## 2023-02-09 DIAGNOSIS — R062 Wheezing: Secondary | ICD-10-CM | POA: Diagnosis not present

## 2023-02-09 DIAGNOSIS — G44019 Episodic cluster headache, not intractable: Secondary | ICD-10-CM

## 2023-02-09 DIAGNOSIS — G44029 Chronic cluster headache, not intractable: Secondary | ICD-10-CM | POA: Insufficient documentation

## 2023-02-09 DIAGNOSIS — R569 Unspecified convulsions: Secondary | ICD-10-CM | POA: Insufficient documentation

## 2023-02-09 MED ORDER — SUMATRIPTAN SUCCINATE 25 MG PO TABS
25.0000 mg | ORAL_TABLET | ORAL | 0 refills | Status: DC | PRN
  Filled 2023-02-09: qty 9, 30d supply, fill #0

## 2023-02-09 MED ORDER — BUDESONIDE-FORMOTEROL FUMARATE 80-4.5 MCG/ACT IN AERO
2.0000 | INHALATION_SPRAY | Freq: Two times a day (BID) | RESPIRATORY_TRACT | 3 refills | Status: DC
  Filled 2023-02-09: qty 10.2, 30d supply, fill #0

## 2023-02-09 MED ORDER — ALBUTEROL SULFATE HFA 108 (90 BASE) MCG/ACT IN AERS
2.0000 | INHALATION_SPRAY | Freq: Four times a day (QID) | RESPIRATORY_TRACT | 2 refills | Status: DC | PRN
  Filled 2023-02-09: qty 6.7, 25d supply, fill #0

## 2023-02-09 MED ORDER — PANTOPRAZOLE SODIUM 40 MG PO TBEC
40.0000 mg | DELAYED_RELEASE_TABLET | Freq: Every day | ORAL | 1 refills | Status: DC
  Filled 2023-02-09: qty 30, 30d supply, fill #0
  Filled 2023-03-12: qty 30, 30d supply, fill #1
  Filled 2023-04-02 – 2023-04-07 (×2): qty 30, 30d supply, fill #2
  Filled 2023-04-30: qty 30, 30d supply, fill #3
  Filled 2023-05-28: qty 60, 60d supply, fill #4

## 2023-02-09 NOTE — Patient Instructions (Addendum)
It was a pleasure to see you today!  Thank you for choosing [facility] for your primary care.   Kara Lawrence was seen for establishing care and chronic headache .   Our plans for today were: I have prescribed Sumatriptan '25mg'$  to take to abort headaches. Please do not take more than 2 doses in the same day. If headaches are not improved with the two doses, please schedule an appointment for further evaluation.  I have provided refills for your albuterol inhaler as well as a new inhaler (QVAR) to help with your breathing symptoms. Please be sure to rinse your mouth after using this new inhaler.  The albuterol inhaler should be used as needed for when you are experiencing increased wheezing or feeling short of breath.  I have refilled your protonix for your GERD symptoms.    You should return to our clinic in 1 month  for follow up on your breathing and headaches.   Best Wishes,   Dr. Quentin Cornwall

## 2023-02-09 NOTE — Assessment & Plan Note (Signed)
Chronic, daily headaches  Has not responded to asa in the past  Headaches sound like cluster type  Will trial sumatriptan '25mg'$  for abortive measures Counseled patient to take no more than 2 doses per day, 2 hours between doses If no improvements, will consider starting verapamil and neurology referral for further evaluation given hx of seizures

## 2023-02-09 NOTE — Assessment & Plan Note (Signed)
Welcomed patient to [facility]  Reviewed patient's medical history, medications, surgical and social history Discussed roles and expectations primary care physician-patient relationship

## 2023-02-09 NOTE — Assessment & Plan Note (Signed)
Chronic  Last seizure was reported 10 years ago She reports adherence to AED regimen  She will continue depakote 1000 at bedtime

## 2023-02-09 NOTE — Assessment & Plan Note (Signed)
Chronic  Reports symptoms worsen with change of seasons Has been diagnosed with bronchitis in the past and uses albuterol every 6 hours  Recommended starting QVAR 80-4.76mg inhaler and using albuterol inhaler PRN for wheezing or at the start of illness (URI) Refills provided for albuterol inhaler  She will follow up in 1 month  Ultimately would benefit from spirometry testing

## 2023-02-09 NOTE — Assessment & Plan Note (Signed)
Chronic  Symptoms well controlled on PPI  Refill provided for protonix '40mg'$  daily  Counseled patient on managing dietary intake to prevent onset of symptoms  She mentions eating spicy food frequently

## 2023-02-10 ENCOUNTER — Telehealth: Payer: Self-pay | Admitting: Family Medicine

## 2023-02-10 ENCOUNTER — Other Ambulatory Visit: Payer: Self-pay

## 2023-02-10 DIAGNOSIS — R062 Wheezing: Secondary | ICD-10-CM

## 2023-02-10 DIAGNOSIS — G44029 Chronic cluster headache, not intractable: Secondary | ICD-10-CM

## 2023-02-10 MED ORDER — QVAR REDIHALER 40 MCG/ACT IN AERB
2.0000 | INHALATION_SPRAY | Freq: Two times a day (BID) | RESPIRATORY_TRACT | 6 refills | Status: DC
  Filled 2023-02-10 – 2023-04-07 (×3): qty 10.6, 30d supply, fill #0

## 2023-02-10 MED ORDER — ALBUTEROL SULFATE HFA 108 (90 BASE) MCG/ACT IN AERS
2.0000 | INHALATION_SPRAY | RESPIRATORY_TRACT | 2 refills | Status: DC | PRN
  Filled 2023-02-10: qty 6.7, 25d supply, fill #0

## 2023-02-10 MED ORDER — RIZATRIPTAN BENZOATE 10 MG PO TABS
10.0000 mg | ORAL_TABLET | ORAL | 0 refills | Status: DC | PRN
  Filled 2023-02-10: qty 10, 30d supply, fill #0

## 2023-02-10 NOTE — Telephone Encounter (Signed)
Alternative prescriptions sent to pharmacy.   Discussed options for decreased costs with pharmacist. Patient will likely have co-pay associated with medications now that insurance status has changed and recommended that the patient consider applying for Medicaid to help with medication costs. She no longer qualifies for the medication management clinic that did not have co-pay associated with medications.   Kara Foster, MD  [facility]

## 2023-02-10 NOTE — Telephone Encounter (Signed)
Patient called in about inhaler and headache meds prescribed yesterday. She wasn't sure what the name of it is looks like albuterol and not sure of name of headache medicine, says is too expensive , looking for another alternative. Please call back

## 2023-02-10 NOTE — Telephone Encounter (Signed)
Please Review

## 2023-02-11 NOTE — Telephone Encounter (Signed)
Patient notified of alternative Rx- will check cost at pharmacy. Advised she may need to apply for assistance with medication cost-Medicaid

## 2023-02-11 NOTE — Telephone Encounter (Signed)
NA-Ok for [facility] Nurse to advise

## 2023-02-12 ENCOUNTER — Other Ambulatory Visit: Payer: Self-pay

## 2023-02-17 ENCOUNTER — Telehealth: Payer: Self-pay

## 2023-02-17 ENCOUNTER — Other Ambulatory Visit: Payer: Self-pay

## 2023-02-17 ENCOUNTER — Other Ambulatory Visit: Payer: Self-pay | Admitting: Family Medicine

## 2023-02-17 DIAGNOSIS — R051 Acute cough: Secondary | ICD-10-CM

## 2023-02-17 DIAGNOSIS — G44019 Episodic cluster headache, not intractable: Secondary | ICD-10-CM

## 2023-02-17 MED ORDER — BENZONATATE 100 MG PO CAPS
100.0000 mg | ORAL_CAPSULE | Freq: Two times a day (BID) | ORAL | 0 refills | Status: DC | PRN
  Filled 2023-02-17: qty 20, 10d supply, fill #0

## 2023-02-17 MED ORDER — SUMATRIPTAN SUCCINATE 25 MG PO TABS
25.0000 mg | ORAL_TABLET | ORAL | 0 refills | Status: DC | PRN
  Filled 2023-02-17 – 2023-03-12 (×2): qty 9, 30d supply, fill #0
  Filled 2023-04-02: qty 9, 15d supply, fill #0

## 2023-02-17 NOTE — Telephone Encounter (Signed)
Rx for tessalon perles '100mg'$  twice daily PRN sent to [place], MD  [facility]

## 2023-02-17 NOTE — Telephone Encounter (Signed)
Copied from [place] [id number]. Topic: General - Inquiry >> Feb 17, 2023  8:01 AM Penni Bombard wrote: Reason for CRM: pt has a cough and wants to know if Dr. Quentin Cornwall can send her the pearls for cough.  She would like to pick them up with the medications she is getting today.  Medication Management   CB#  [phone]

## 2023-02-27 ENCOUNTER — Other Ambulatory Visit: Payer: Self-pay

## 2023-03-12 ENCOUNTER — Other Ambulatory Visit: Payer: Self-pay

## 2023-03-15 NOTE — Progress Notes (Unsigned)
I,Kara Lawrence,acting as a scribe for [employer], MD.,have documented all relevant documentation on the behalf of Kara Foster, MD,as directed by  Kara Foster, MD while in the presence of Kara Foster, MD.   Established patient visit   Patient: Kara Lawrence   DOB: 07/12/1976   47 y.o. Female  MRN: [id number] Visit Date: 03/16/2023  Today's healthcare provider: [name], MD   Chief Complaint  Patient presents with   Follow-up   Subjective    HPI  Reports that has applied for medicaid and is waiting on the decision notification  She submitted papers last week for her application States she currently does  not have insurance    Cluster Headaches  She was evauated for this in Feb 2024 She was prescribed imitrex at that time  Reports symptoms are the same. Patient no taking medication due to cost  Wheezing  Patient reports reports inhaler prescribed was very expensive and is in the process to see if she can get medicaid.  She states that her breathing has been "so/so" She has continued using albuterol 56mcg a few times per day   Medications: Outpatient Medications Prior to Visit  Medication Sig   divalproex (DEPAKOTE ER) 500 MG 24 hr tablet Take 2 tablets (1,000 mg total) by mouth at bedtime.   DULoxetine (CYMBALTA) 30 MG capsule TAKE 1 CAPSULE BY MOUTH ONCE DAILY.   loratadine (CLARITIN) 10 MG tablet TAKE 1 TABLET BY MOUTH ONCE EVERY DAY.   pantoprazole (PROTONIX) 40 MG tablet Take 1 tablet (40 mg total) by mouth daily.   prazosin (MINIPRESS) 1 MG capsule Take 2 capsules (2mg  total) by mouth once nightly at bedtime.   propranolol (INDERAL) 10 MG tablet Take 1 tablet by mouth every morning and 1 tablet by mouth at night   traZODone (DESYREL) 150 MG tablet Take (1/2) tablet (75mg ) to 1 tablet (150mg ) by mouth once nightly at bedtime as needed for sleep. (Take at least 30 minutes before bed).    albuterol (VENTOLIN HFA) 108 (90 Base) MCG/ACT inhaler Inhale 2-4 puffs into the lungs every 4 (four) hours as needed for wheezing (or cough). (Patient not taking: Reported on 03/16/2023)   beclomethasone (QVAR REDIHALER) 40 MCG/ACT inhaler Inhale 2 puffs into the lungs 2 (two) times daily. (Patient not taking: Reported on 03/16/2023)   benzonatate (TESSALON) 100 MG capsule Take 1 capsule (100 mg total) by mouth 2 (two) times daily as needed for cough. (Patient not taking: Reported on 03/16/2023)   SUMAtriptan (IMITREX) 25 MG tablet Take 1 tablet (25 mg total) by mouth every 2 (two) hours as needed for headache. May repeat in 2 hours if headache persists or recurs. (Patient not taking: Reported on 03/16/2023)   No facility-administered medications prior to visit.    Review of Systems     Objective    BP 133/88 (BP Location: Left Arm, Patient Position: Sitting, Cuff Size: Normal)   Pulse 96   Temp 98 F (36.7 C) (Oral)   Resp 16   Wt 143 lb 8 oz (65.1 kg)   LMP 10/25/2019 (Approximate)   SpO2 97%   BMI 27.11 kg/m    Physical Exam Vitals reviewed.  Constitutional:      General: She is not in acute distress.    Appearance: Normal appearance. She is not ill-appearing, toxic-appearing or diaphoretic.  Eyes:     Conjunctiva/sclera: Conjunctivae normal.  Neck:     Thyroid: No thyroid mass, thyromegaly or thyroid tenderness.  Vascular: No carotid bruit.  Cardiovascular:     Rate and Rhythm: Normal rate and regular rhythm.     Pulses: Normal pulses.     Heart sounds: Normal heart sounds. No murmur heard.    No friction rub. No gallop.  Pulmonary:     Effort: Pulmonary effort is normal. No respiratory distress.     Breath sounds: Normal breath sounds. No stridor. No wheezing, rhonchi or rales.  Abdominal:     General: Bowel sounds are normal. There is no distension.     Palpations: Abdomen is soft.     Tenderness: There is no abdominal tenderness.  Musculoskeletal:     Right lower  leg: No edema.     Left lower leg: No edema.  Lymphadenopathy:     Cervical: No cervical adenopathy.  Skin:    Findings: No erythema or rash.  Neurological:     Mental Status: She is alert and oriented to person, place, and time.       No results found for any visits on 03/16/23.  Assessment & Plan     Problem List Items Addressed This Visit       Nervous and Auditory   Chronic cluster headache, not intractable    Chronic  Continues to have headaches most days of the week  She states she did not start the imitrex prescribed at her last visit  Patient was given sample of imitrex today Attempted to review instructions provided with the patient Follow up recommended once she has been able to establish with insurance so that I can prescribe a medication for headaches         Other   Wheezing - Primary    Chronic  Symptoms not yet controlled  She has continued to use PRN albuterol throughout the day due to issues with medication affordability while she is waiting to hear from [facility]  Recommended scheduling follow up once insurance has been sorted out so we can prescribe cost effective inhaler, patient was agreeable          Return in about 3 months (around 06/15/2023) for wheezing.        The entirety of the information documented in the History of Present Illness, Review of Systems and Physical Exam were personally obtained by me. Portions of this information were initially documented by Kara Lawrence, CMA . I, Kara Foster, MD have reviewed the documentation above for thoroughness and accuracy.      Kara Foster, MD  [facility] [phone] (phone) [phone] (fax)  [place]

## 2023-03-16 ENCOUNTER — Encounter: Payer: Self-pay | Admitting: Family Medicine

## 2023-03-16 ENCOUNTER — Ambulatory Visit (INDEPENDENT_AMBULATORY_CARE_PROVIDER_SITE_OTHER): Payer: Self-pay | Admitting: Family Medicine

## 2023-03-16 VITALS — BP 133/88 | HR 96 | Temp 98.0°F | Resp 16 | Wt 143.5 lb

## 2023-03-16 DIAGNOSIS — R062 Wheezing: Secondary | ICD-10-CM

## 2023-03-16 DIAGNOSIS — G44029 Chronic cluster headache, not intractable: Secondary | ICD-10-CM

## 2023-03-16 NOTE — Assessment & Plan Note (Signed)
Chronic  Symptoms not yet controlled  She has continued to use PRN albuterol throughout the day due to issues with medication affordability while she is waiting to hear from [facility]  Recommended scheduling follow up once insurance has been sorted out so we can prescribe cost effective inhaler, patient was agreeable

## 2023-03-16 NOTE — Assessment & Plan Note (Signed)
Chronic  Continues to have headaches most days of the week  She states she did not start the imitrex prescribed at her last visit  Patient was given sample of imitrex today Attempted to review instructions provided with the patient Follow up recommended once she has been able to establish with insurance so that I can prescribe a medication for headaches

## 2023-04-02 ENCOUNTER — Other Ambulatory Visit: Payer: Self-pay

## 2023-04-07 ENCOUNTER — Other Ambulatory Visit: Payer: Self-pay

## 2023-04-10 ENCOUNTER — Other Ambulatory Visit: Payer: Self-pay

## 2023-04-30 ENCOUNTER — Other Ambulatory Visit: Payer: Self-pay

## 2023-05-19 ENCOUNTER — Other Ambulatory Visit: Payer: Self-pay

## 2023-05-19 MED ORDER — TRAZODONE HCL 150 MG PO TABS
75.0000 mg | ORAL_TABLET | Freq: Every evening | ORAL | 2 refills | Status: DC | PRN
  Filled 2023-05-19: qty 30, 30d supply, fill #0

## 2023-05-19 MED ORDER — DULOXETINE HCL 30 MG PO CPEP
30.0000 mg | ORAL_CAPSULE | Freq: Every day | ORAL | 2 refills | Status: DC
  Filled 2023-05-19: qty 30, 30d supply, fill #0

## 2023-05-19 MED ORDER — DIVALPROEX SODIUM ER 500 MG PO TB24
1000.0000 mg | ORAL_TABLET | Freq: Every day | ORAL | 2 refills | Status: DC
  Filled 2023-05-19: qty 60, 30d supply, fill #0

## 2023-05-20 ENCOUNTER — Ambulatory Visit: Payer: Self-pay

## 2023-05-20 NOTE — Telephone Encounter (Signed)
Chief Complaint: Cough Symptoms: Wheezing, SOB (at rest, a little), nasal congestion Frequency: Over the weekend Pertinent Negatives: Patient denies chest pain and other symptoms Disposition: [] ED /[] Urgent Care (no appt availability in office) / [x] Appointment(In office/virtual)/ []  [facility]/ [] Home Care/ [] Refused Recommended Disposition /[]  Mobile Bus/ []  Follow-up with PCP Additional Notes: She says the cough is congested, nothing is coming up. Patient advised she will need an evaluation due to the last time tessalon was prescribed was in March and these are new symptoms. She says she's on the way to work and would not have a ride today and can't call out of work. Advised availability tomorrow, she says she will come to the office tomorrow and will call to have her social worker bring her.    Summary: pt wanting unnamed med called in/agitated   Pt has called stating has an appt for 6/27 but her allergies are very bad now and she wants the little yellow pill that has been prescribed before to be called in now. She states has a terrible cough, pt seems very agitated when questioning for further info [phone]         Reason for Disposition  [1] Continuous (nonstop) coughing interferes with work or school AND [2] no improvement using cough treatment per Care Advice  Answer Assessment - Initial Assessment Questions 1. ONSET: "When did the cough begin?"      Over the weekend 2. SEVERITY: "How bad is the cough today?"      Coughing, but not consistently 3. SPUTUM: "Describe the color of your sputum" (none, dry cough; clear, white, yellow, green)     Congested cough, nothing comes up 4. HEMOPTYSIS: "Are you coughing up any blood?" If so ask: "How much?" (flecks, streaks, tablespoons, etc.)     N/A 5. DIFFICULTY BREATHING: "Are you having difficulty breathing?" If Yes, ask: "How bad is it?" (e.g., mild, moderate, severe)    - MILD: No SOB at rest, mild SOB with  walking, speaks normally in sentences, can lie down, no retractions, pulse < 100.    - MODERATE: SOB at rest, SOB with minimal exertion and prefers to sit, cannot lie down flat, speaks in phrases, mild retractions, audible wheezing, pulse 100-120.    - SEVERE: Very SOB at rest, speaks in single words, struggling to breathe, sitting hunched forward, retractions, pulse > 120      Moderate 6. FEVER: "Do you have a fever?" If Yes, ask: "What is your temperature, how was it measured, and when did it start?"     No 7. CARDIAC HISTORY: "Do you have any history of heart disease?" (e.g., heart attack, congestive heart failure)      No 8. LUNG HISTORY: "Do you have any history of lung disease?"  (e.g., pulmonary embolus, asthma, emphysema)     Bronchitis 9. PE RISK FACTORS: "Do you have a history of blood clots?" (or: recent major surgery, recent prolonged travel, bedridden)     No 10. OTHER SYMPTOMS: "Do you have any other symptoms?" (e.g., runny nose, wheezing, chest pain)       Nasal congestion, wheezing  Protocols used: Cough - Acute Non-Productive-A-AH

## 2023-05-21 ENCOUNTER — Encounter: Payer: Self-pay | Admitting: Family Medicine

## 2023-05-21 ENCOUNTER — Other Ambulatory Visit: Payer: Self-pay

## 2023-05-21 ENCOUNTER — Ambulatory Visit (INDEPENDENT_AMBULATORY_CARE_PROVIDER_SITE_OTHER): Payer: Medicaid Other | Admitting: Family Medicine

## 2023-05-21 VITALS — BP 125/91 | HR 97 | Temp 97.9°F | Wt 138.8 lb

## 2023-05-21 DIAGNOSIS — J4541 Moderate persistent asthma with (acute) exacerbation: Secondary | ICD-10-CM | POA: Diagnosis not present

## 2023-05-21 DIAGNOSIS — Z5986 Financial insecurity: Secondary | ICD-10-CM

## 2023-05-21 DIAGNOSIS — R030 Elevated blood-pressure reading, without diagnosis of hypertension: Secondary | ICD-10-CM | POA: Diagnosis not present

## 2023-05-21 DIAGNOSIS — J4540 Moderate persistent asthma, uncomplicated: Secondary | ICD-10-CM | POA: Insufficient documentation

## 2023-05-21 MED ORDER — BENZONATATE 200 MG PO CAPS
200.0000 mg | ORAL_CAPSULE | Freq: Three times a day (TID) | ORAL | 1 refills | Status: DC | PRN
  Filled 2023-05-21: qty 90, 30d supply, fill #0

## 2023-05-21 MED ORDER — FLUTICASONE PROPIONATE HFA 220 MCG/ACT IN AERO
1.0000 | INHALATION_SPRAY | Freq: Two times a day (BID) | RESPIRATORY_TRACT | 12 refills | Status: DC
  Filled 2023-05-21 – 2023-06-25 (×2): qty 12, 30d supply, fill #0

## 2023-05-21 MED ORDER — AZITHROMYCIN 500 MG PO TABS
500.0000 mg | ORAL_TABLET | Freq: Every day | ORAL | 0 refills | Status: DC
  Filled 2023-05-21: qty 7, 7d supply, fill #0

## 2023-05-21 MED ORDER — TRELEGY ELLIPTA 200-62.5-25 MCG/ACT IN AEPB: 1.0000 | INHALATION_SPRAY | Freq: Every day | RESPIRATORY_TRACT | 0 refills | Status: DC

## 2023-05-21 MED ORDER — PREDNISONE 50 MG PO TABS
50.0000 mg | ORAL_TABLET | Freq: Every day | ORAL | 0 refills | Status: DC
  Filled 2023-05-21: qty 5, 5d supply, fill #0

## 2023-05-21 NOTE — Progress Notes (Signed)
Established patient visit   Patient: Kara Lawrence   DOB: November 13, 1976   47 y.o. Female  MRN: [id number] Visit Date: 05/21/2023  Today's healthcare provider: Jacky Kindle, FNP  Introduced to nurse practitioner role and practice setting.  All questions answered.  Discussed provider/patient relationship and expectations.  Subjective    HPI  Cough: Patient complains of nonproductive cough and wheezing.  Symptoms began a few days ago.  The cough is non-productive, with wheezing and is aggravated by nothing Associated symptoms include:wheezing and chest tightness . Patient does have a history of asthma. Patient does not have a history of environmental allergens. Patient has not recently traveled. Patient does have a history of smoking; per chart review- pt denied.  Medications: Outpatient Medications Prior to Visit  Medication Sig   divalproex (DEPAKOTE ER) 500 MG 24 hr tablet Take 2 tablets (1,000 mg total) by mouth at bedtime.   pantoprazole (PROTONIX) 40 MG tablet Take 1 tablet (40 mg total) by mouth daily.   SUMAtriptan (IMITREX) 25 MG tablet Take 1 tablet (25 mg total) by mouth every 2 (two) hours as needed for headache. May repeat in 2 hours if headache persists or recurs.   traZODone (DESYREL) 150 MG tablet Take 0.5-1 tablets (75-150 mg total) by mouth at bedtime as needed for sleep - take at least 1/2 hour before bed   [DISCONTINUED] divalproex (DEPAKOTE ER) 500 MG 24 hr tablet Take 2 tablets (1,000 mg total) by mouth at bedtime.   [DISCONTINUED] DULoxetine (CYMBALTA) 30 MG capsule TAKE 1 CAPSULE BY MOUTH ONCE DAILY.   [DISCONTINUED] DULoxetine (CYMBALTA) 30 MG capsule Take 1 capsule (30 mg total) by mouth daily.   [DISCONTINUED] loratadine (CLARITIN) 10 MG tablet TAKE 1 TABLET BY MOUTH ONCE EVERY DAY.   [DISCONTINUED] prazosin (MINIPRESS) 1 MG capsule Take 2 capsules (2mg  total) by mouth once nightly at bedtime.   [DISCONTINUED] propranolol (INDERAL) 10 MG tablet Take 1  tablet by mouth every morning and 1 tablet by mouth at night   [DISCONTINUED] traZODone (DESYREL) 150 MG tablet Take (1/2) tablet (75mg ) to 1 tablet (150mg ) by mouth once nightly at bedtime as needed for sleep. (Take at least 30 minutes before bed).   albuterol (VENTOLIN HFA) 108 (90 Base) MCG/ACT inhaler Inhale 2-4 puffs into the lungs every 4 (four) hours as needed for wheezing (or cough). (Patient not taking: Reported on 03/16/2023)   [DISCONTINUED] beclomethasone (QVAR REDIHALER) 40 MCG/ACT inhaler Inhale 2 puffs into the lungs 2 (two) times daily. (Patient not taking: Reported on 03/16/2023)   [DISCONTINUED] benzonatate (TESSALON) 100 MG capsule Take 1 capsule (100 mg total) by mouth 2 (two) times daily as needed for cough. (Patient not taking: Reported on 03/16/2023)   No facility-administered medications prior to visit.    Review of Systems     Objective    BP (!) 125/91 (BP Location: Left Arm, Patient Position: Sitting, Cuff Size: Normal)   Pulse 97   Temp 97.9 F (36.6 C) (Oral)   Wt 138 lb 12.8 oz (63 kg)   LMP 10/25/2019 (Approximate)   SpO2 100%   BMI 26.23 kg/m    Physical Exam Vitals and nursing note reviewed.  Constitutional:      General: She is not in acute distress.    Appearance: Normal appearance. She is overweight. She is not ill-appearing, toxic-appearing or diaphoretic.  HENT:     Head: Normocephalic and atraumatic.     Right Ear: Tympanic membrane, ear canal and external  ear normal.     Left Ear: Tympanic membrane, ear canal and external ear normal.     Ears:     Comments: Moderate cerumen     Nose: Nose normal.     Mouth/Throat:     Mouth: Mucous membranes are moist.     Pharynx: Oropharynx is clear. No oropharyngeal exudate or posterior oropharyngeal erythema.  Cardiovascular:     Rate and Rhythm: Normal rate and regular rhythm.     Pulses: Normal pulses.     Heart sounds: Normal heart sounds. No murmur heard.    No friction rub. No gallop.  Pulmonary:      Effort: Pulmonary effort is normal. No respiratory distress.     Breath sounds: Decreased air movement present. No stridor. Examination of the right-upper field reveals decreased breath sounds and wheezing. Examination of the left-upper field reveals decreased breath sounds and wheezing. Examination of the right-middle field reveals decreased breath sounds. Examination of the left-middle field reveals decreased breath sounds. Examination of the right-lower field reveals decreased breath sounds. Examination of the left-lower field reveals decreased breath sounds. Decreased breath sounds and wheezing present. No rhonchi or rales.     Comments: Regular respiratory effort on RA; no abdominal or accessory muscle use. No tripod or augmented posture. Chest:     Chest wall: No tenderness.  Musculoskeletal:        General: No swelling, tenderness, deformity or signs of injury. Normal range of motion.     Right lower leg: No edema.     Left lower leg: No edema.  Skin:    General: Skin is warm and dry.     Capillary Refill: Capillary refill takes less than 2 seconds.     Coloration: Skin is not jaundiced or pale.     Findings: No bruising, erythema, lesion or rash.  Neurological:     General: No focal deficit present.     Mental Status: She is alert and oriented to person, place, and time. Mental status is at baseline.     Cranial Nerves: No cranial nerve deficit.     Sensory: No sensory deficit.     Motor: No weakness.     Coordination: Coordination normal.  Psychiatric:        Mood and Affect: Mood normal.        Behavior: Behavior normal.        Thought Content: Thought content normal.        Judgment: Judgment normal.     No results found for any visits on 05/21/23.  Assessment & Plan     Problem List Items Addressed This Visit       Respiratory   Moderate persistent asthma with exacerbation - Primary    Acute on chronic, unable to afford daily inhaler- Qvar Samples provided of  Trelegy 200 Referral to CCM team to assist with med costs Recommend 5 day steroid, 7 day abx Has albuterol- encouraged use as back up only Instructed how to use Trelegy Referral to pulm for PFTs once stable      Relevant Medications   benzonatate (TESSALON) 200 MG capsule   fluticasone (FLOVENT HFA) 220 MCG/ACT inhaler   azithromycin (ZITHROMAX) 500 MG tablet   predniSONE (DELTASONE) 50 MG tablet   Fluticasone-Umeclidin-Vilant (TRELEGY ELLIPTA) 200-62.5-25 MCG/ACT AEPB   Other Relevant Orders   Ambulatory referral to Pulmonology   AMB Referral to Managed Medicaid Care Management     Other   Elevated blood pressure reading in office without  diagnosis of hypertension    Acute, DBP >90s Education provided on DASH and preventing HTN Continue to monitor      Patient cannot afford medications   Relevant Orders   AMB Referral to Managed Medicaid Care Management   Return if symptoms worsen or fail to improve.     Leilani Merl, FNP, have reviewed all documentation for this visit. The documentation on 05/21/23 for the exam, diagnosis, procedures, and orders are all accurate and complete.  Jacky Kindle, FNP  [facility] [phone] (phone) [phone] (fax)  [facility]

## 2023-05-21 NOTE — Assessment & Plan Note (Signed)
Acute on chronic, unable to afford daily inhaler- Qvar Samples provided of Trelegy 200 Referral to CCM team to assist with med costs Recommend 5 day steroid, 7 day abx Has albuterol- encouraged use as back up only Instructed how to use Trelegy Referral to pulm for PFTs once stable

## 2023-05-21 NOTE — Patient Instructions (Addendum)
Use Trelegy[sample] or flovent daily Use albuterol as rescue, as needed inhaler Continue to work to identify triggers  When stable, following use of antibiotic and steroid, schedule with pulmonary team to assist with lung function tests  Referral placed to case management team to assist with medication costs

## 2023-05-21 NOTE — Assessment & Plan Note (Signed)
Acute, DBP >90s Education provided on DASH and preventing HTN Continue to monitor

## 2023-05-28 ENCOUNTER — Other Ambulatory Visit: Payer: Self-pay

## 2023-06-11 ENCOUNTER — Ambulatory Visit: Payer: Self-pay | Admitting: Family Medicine

## 2023-06-15 ENCOUNTER — Ambulatory Visit: Payer: Self-pay | Admitting: Family Medicine

## 2023-06-23 ENCOUNTER — Telehealth: Payer: Self-pay

## 2023-06-23 DIAGNOSIS — G44019 Episodic cluster headache, not intractable: Secondary | ICD-10-CM | POA: Insufficient documentation

## 2023-06-23 DIAGNOSIS — G43009 Migraine without aura, not intractable, without status migrainosus: Secondary | ICD-10-CM | POA: Insufficient documentation

## 2023-06-23 NOTE — Progress Notes (Signed)
   Care Guide Note  06/23/2023 Name: [name] MRN: [id number] DOB: November 20, 1976  Referred by: Ronnald Ramp, MD Reason for referral : Care Coordination (Outreach to schedule with Pharm d )   Kara Lawrence is a 47 y.o. year old female who is a primary care patient of Simmons-Robinson, Tawanna Cooler, MD. Milus Mallick was referred to the pharmacist for assistance related to COPD.    Successful contact was made with the patient to discuss pharmacy services including being ready for the pharmacist to call at least 5 minutes before the scheduled appointment time, to have medication bottles and any blood sugar or blood pressure readings ready for review. The patient agreed to meet with the pharmacist via with the pharmacist via telephone visit on (date/time).  06/25/2023  Penne Lash, RMA Care Guide [facility]  [place], [place] [address] Direct Dial: [phone] [email or web address]

## 2023-06-23 NOTE — Progress Notes (Unsigned)
Established patient visit   Patient: Kara Lawrence   DOB: 02-15-1976   47 y.o. Female  MRN: [id number] Visit Date: 06/24/2023  Today's healthcare provider: Ronnald Ramp, MD   No chief complaint on file.  Subjective     HPI   Last ov 05/21/2023 Acute on chronic, unable to afford daily inhaler- Qvar Samples provided of Trelegy 200 Referral to CCM team to assist with med costs Recommend 5 day steroid, 7 day abx Has albuterol- encouraged use as back up only Instructed how to use Trelegy Referral to pulm for PFTs once stable. Patient reports using Trelegy daily, reports good compliance and tolerance. She reports symptoms have improved. Patient c/o recurrent and persistent migraines. Patient reports OTC medications do not help with migraine.    Last edited by Ronnald Ramp, MD on 06/24/2023  1:20 PM.      Discussed the use of AI scribe software for clinical note transcription with the patient, who gave verbal consent to proceed.   The patient, with a history of asthma, presents for a follow-up visit after a recent exacerbation. They report a persistent cough, which is present throughout the day, every day. The cough is severe enough to wake them up at night, although the frequency varies. They deny any wheezing. They have been using Trelegy daily, which was provided as a sample due to the cost of their prescribed inhaler. Despite the persistent cough, the patient denies any limitation in their daily activities, including work, but does note occasional need to rest to catch their breath.  In addition to their asthma, the patient also reports daily migraines, which last almost all day. They describe the pain as severe and causing irritability, but do not report any associated visual changes. Over-the-counter medications, including Tylenol, ibuprofen, and Aleve, have not been effective in managing the pain. They have not tried Imitrex due to  cost.  The patient also has a history of acid reflux, which is well-controlled with daily Protonix. They have been taking Depakote 1000 mg daily without any reported issues. They also report taking Claritin for allergies, but are currently out of this medication.       Medications: Outpatient Medications Prior to Visit  Medication Sig   divalproex (DEPAKOTE ER) 500 MG 24 hr tablet Take 2 tablets (1,000 mg total) by mouth at bedtime.   fluticasone (FLOVENT HFA) 220 MCG/ACT inhaler Inhale 1 puff into the lungs in the morning and at bedtime.   Fluticasone-Umeclidin-Vilant (TRELEGY ELLIPTA) 200-62.5-25 MCG/ACT AEPB Inhale 1 puff into the lungs daily.   traZODone (DESYREL) 150 MG tablet Take 0.5-1 tablets (75-150 mg total) by mouth at bedtime as needed for sleep - take at least 1/2 hour before bed   [DISCONTINUED] albuterol (VENTOLIN HFA) 108 (90 Base) MCG/ACT inhaler Inhale 2-4 puffs into the lungs every 4 (four) hours as needed for wheezing (or cough). (Patient not taking: Reported on 03/16/2023)   [DISCONTINUED] azithromycin (ZITHROMAX) 500 MG tablet Take 1 tablet (500 mg total) by mouth daily.   [DISCONTINUED] benzonatate (TESSALON) 200 MG capsule Take 1 capsule (200 mg total) by mouth 3 (three) times daily as needed for cough.   [DISCONTINUED] pantoprazole (PROTONIX) 40 MG tablet Take 1 tablet (40 mg total) by mouth daily.   [DISCONTINUED] predniSONE (DELTASONE) 50 MG tablet Take 1 tablet (50 mg total) by mouth daily with breakfast.   [DISCONTINUED] SUMAtriptan (IMITREX) 25 MG tablet Take 1 tablet (25 mg total) by mouth every 2 (two) hours as needed  for headache. May repeat in 2 hours if headache persists or recurs.   No facility-administered medications prior to visit.    Review of Systems     Objective    BP 118/82 (BP Location: Left Arm, Patient Position: Sitting, Cuff Size: Normal)   Pulse 88   Temp 98.2 F (36.8 C) (Temporal)   Resp 16   Ht 5\' 1"  (1.549 m)   Wt 140 lb 3.2 oz  (63.6 kg)   LMP 10/25/2019 (Approximate)   SpO2 96%   BMI 26.49 kg/m    Physical Exam  Physical Exam   HEENT: Throat normal. Extraocular movements intact. Oral mucosa without lesions, tongue normal, no oral masses. CHEST: Lungs clear, no wheezing. MUSCULOSKELETAL: Normal muscle strength in upper and lower extremities. NEUROLOGICAL: Cranial nerves grossly intact, no facial asymmetry, strength intact in all four extremities, coordination intact.       No results found for any visits on 06/24/23.  Assessment & Plan     Problem List Items Addressed This Visit     Gastroesophageal reflux disease with esophagitis    : Well controlled on Protonix 40 mg daily. -Chronic -symptoms well controlled on regimen  -Continue Protonix 40 mg daily. -Sent 90-day refill with two additional refills.      Relevant Medications   pantoprazole (PROTONIX) 40 MG tablet   Seizures (HCC)    chronic Stable on Depakote 1000 mg daily. -Continue Depakote 1000 mg daily.      Relevant Medications   Ubrogepant (UBRELVY) 50 MG TABS   Moderate persistent asthma with exacerbation - Primary     Daily cough, occasional dyspnea with exertion, and nocturnal symptoms. No wheezing on exam. Trelegy 262.525 mcg daily has been helpful. Referral to pulmonology for further evaluation and lung function tests. -Chronic -not well controlled  -Continue Trelegy 200-62.5-25 mcg daily. 2 puffs daily  -Resupply with samples of Trelegy. -Resend prescription for Albuterol inhaler for as needed use. -Follow-up with pulmonology once appointment is scheduled.      Relevant Medications   albuterol (VENTOLIN HFA) 108 (90 Base) MCG/ACT inhaler   Patient cannot afford medications    Referred to CCM for assistance with pharmacy due to inability to afford inhalers causing uncontrolled asthma symptoms and increasing risk of exacerbations  Patient has scheduled follow up with pharmacy 06/25/23      Elevated blood pressure reading  in office without diagnosis of hypertension    Resolved  BP normal range today  No medications recommended       Migraine headache without aura    Chronic Not well controlled  Daily HA reported  Daily migraines lasting all day. Over the counter medications not helpful. No history of trying Imitrex. -Start Ubrelvy 50 mg as needed for migraines. -Consider trial of Imitrex in the future for insurance purposes.      Relevant Medications   Ubrogepant (UBRELVY) 50 MG TABS   Other Visit Diagnoses     Wheezing       Relevant Medications   albuterol (VENTOLIN HFA) 108 (90 Base) MCG/ACT inhaler      Assessment and Plan       Return in about 1 month (around 07/25/2023) for MIGRAINE, ASTHMA.         Ronnald Ramp, MD  [facility] [phone] (phone) [phone] (fax)  [facility]

## 2023-06-24 ENCOUNTER — Ambulatory Visit (INDEPENDENT_AMBULATORY_CARE_PROVIDER_SITE_OTHER): Payer: MEDICAID | Admitting: Family Medicine

## 2023-06-24 ENCOUNTER — Encounter: Payer: Self-pay | Admitting: Family Medicine

## 2023-06-24 ENCOUNTER — Other Ambulatory Visit: Payer: Self-pay

## 2023-06-24 VITALS — BP 118/82 | HR 88 | Temp 98.2°F | Resp 16 | Ht 61.0 in | Wt 140.2 lb

## 2023-06-24 DIAGNOSIS — R569 Unspecified convulsions: Secondary | ICD-10-CM

## 2023-06-24 DIAGNOSIS — G43009 Migraine without aura, not intractable, without status migrainosus: Secondary | ICD-10-CM

## 2023-06-24 DIAGNOSIS — K2100 Gastro-esophageal reflux disease with esophagitis, without bleeding: Secondary | ICD-10-CM

## 2023-06-24 DIAGNOSIS — Z5986 Financial insecurity: Secondary | ICD-10-CM

## 2023-06-24 DIAGNOSIS — J4541 Moderate persistent asthma with (acute) exacerbation: Secondary | ICD-10-CM

## 2023-06-24 DIAGNOSIS — R062 Wheezing: Secondary | ICD-10-CM

## 2023-06-24 DIAGNOSIS — R030 Elevated blood-pressure reading, without diagnosis of hypertension: Secondary | ICD-10-CM

## 2023-06-24 DIAGNOSIS — G44019 Episodic cluster headache, not intractable: Secondary | ICD-10-CM

## 2023-06-24 MED ORDER — ALBUTEROL SULFATE HFA 108 (90 BASE) MCG/ACT IN AERS
2.0000 | INHALATION_SPRAY | RESPIRATORY_TRACT | 6 refills | Status: DC | PRN
  Filled 2023-06-24: qty 36, 60d supply, fill #0

## 2023-06-24 MED ORDER — UBRELVY 50 MG PO TABS: 50.0000 mg | ORAL_TABLET | ORAL | 2 refills | Status: DC | PRN

## 2023-06-24 MED ORDER — PANTOPRAZOLE SODIUM 40 MG PO TBEC
40.0000 mg | DELAYED_RELEASE_TABLET | Freq: Every day | ORAL | 2 refills | Status: DC
  Filled 2023-06-24 – 2023-07-20 (×2): qty 90, 90d supply, fill #0
  Filled 2023-09-29: qty 90, 90d supply, fill #1

## 2023-06-24 NOTE — Patient Instructions (Signed)
Please start ubrelvy 50mg  for onset of migraine  You can take second dose 2 hours after initial dose if migraine persists    Please continue trelegy as prescribed    Follow up in 1 month

## 2023-06-24 NOTE — Assessment & Plan Note (Signed)
Referred to CCM for assistance with pharmacy due to inability to afford inhalers causing uncontrolled asthma symptoms and increasing risk of exacerbations  Patient has scheduled follow up with pharmacy 06/25/23

## 2023-06-24 NOTE — Assessment & Plan Note (Signed)
Chronic Not well controlled  Daily HA reported  Daily migraines lasting all day. Over the counter medications not helpful. No history of trying Imitrex. -Start Ubrelvy 50 mg as needed for migraines. -Consider trial of Imitrex in the future for insurance purposes.

## 2023-06-24 NOTE — Assessment & Plan Note (Signed)
Resolved  BP normal range today  No medications recommended

## 2023-06-24 NOTE — Assessment & Plan Note (Signed)
:   Well controlled on Protonix 40 mg daily. -Chronic -symptoms well controlled on regimen  -Continue Protonix 40 mg daily. -Sent 90-day refill with two additional refills.

## 2023-06-24 NOTE — Assessment & Plan Note (Signed)
chronic Stable on Depakote 1000 mg daily. -Continue Depakote 1000 mg daily.

## 2023-06-24 NOTE — Assessment & Plan Note (Signed)
Daily cough, occasional dyspnea with exertion, and nocturnal symptoms. No wheezing on exam. Trelegy 262.525 mcg daily has been helpful. Referral to pulmonology for further evaluation and lung function tests. -Chronic -not well controlled  -Continue Trelegy 200-62.5-25 mcg daily. 2 puffs daily  -Resupply with samples of Trelegy. -Resend prescription for Albuterol inhaler for as needed use. -Follow-up with pulmonology once appointment is scheduled.

## 2023-06-25 ENCOUNTER — Other Ambulatory Visit: Payer: Self-pay | Admitting: Family Medicine

## 2023-06-25 ENCOUNTER — Telehealth: Payer: Self-pay

## 2023-06-25 ENCOUNTER — Other Ambulatory Visit: Payer: 59 | Admitting: Pharmacist

## 2023-06-25 ENCOUNTER — Encounter: Payer: Self-pay | Admitting: Pharmacist

## 2023-06-25 ENCOUNTER — Other Ambulatory Visit: Payer: Self-pay

## 2023-06-25 DIAGNOSIS — G43009 Migraine without aura, not intractable, without status migrainosus: Secondary | ICD-10-CM

## 2023-06-25 DIAGNOSIS — J4541 Moderate persistent asthma with (acute) exacerbation: Secondary | ICD-10-CM

## 2023-06-25 MED ORDER — UBRELVY 50 MG PO TABS: 50.0000 mg | ORAL_TABLET | ORAL | 2 refills | Status: DC | PRN

## 2023-06-25 MED ORDER — FLUTICASONE PROPIONATE HFA 220 MCG/ACT IN AERO
1.0000 | INHALATION_SPRAY | Freq: Two times a day (BID) | RESPIRATORY_TRACT | 12 refills | Status: DC
  Filled 2023-06-25 – 2023-07-20 (×2): qty 12, 30d supply, fill #0

## 2023-06-25 NOTE — Telephone Encounter (Signed)
Copied from CRM [id number]. Topic: General - Other >> Jun 24, 2023  2:10 PM Dominique A wrote: Reason for CRM: Pt states that she was just seen in the office today and forgot her letter for work. Pt case worker Krystal Eaton will come and pick up the letter for work tomorrow at 10am for the pt.

## 2023-06-25 NOTE — Telephone Encounter (Signed)
Letter completed and signed.   Placed up front in patient form pick up box in [address].   Ronnald Ramp, MD  [facility]

## 2023-06-25 NOTE — Progress Notes (Signed)
06/25/2023 Name: Isadore Bokhari MRN: [id number] DOB: 03-25-1976  Chief Complaint  Patient presents with   Medication Management    Medication cost    Toniqua Mry Lamia is a 47 y.o. year old female who presented for a telephone visit.   They were referred to the pharmacist by their PCP for assistance in managing medication access.    Subjective:  Care Team: Primary Care Provider: Ronnald Ramp, MD ; Next Scheduled Visit: 07/27/2023 Has been referred to pulmonology for PFTs and work up but appointment has not been scheduled yet.   Medication Access/Adherence  Current Pharmacy:  [facility] - [place], [place] - [address] [facility] [address] [facility] [address] [place] [address] Phone: [phone] Fax: [phone]  [facility] [address] [place] [address] Phone: [phone] Fax: [phone]   Patient reports affordability concerns with their medications: Yes  Patient reports access/transportation concerns to their pharmacy: No  Patient reports adherence concerns with their medications:  Yes  due to cost   Medication Management: Patient has moderate persistent asthma. She was prescribed fluticasone HFA inhaler 05/21/2023 but per patient she has not been able to pick up yet due to cost being over $200.  She was provided by PCP office with a sample of Trelegy and she has been using this for the last several weeks with good results.   Rescue inhaler - albuterol HFA inhaler 2 to 4 puffs into the lungs every 4 hous as needed for wheezing or cough.  Patient reports she does have [place] Medicaid and that most of her prescriptions are $4.  Reviewed med list and refill history.   Last refill for devalproex and trazodone was 05/21/2023 for 30 day supply - patient reports she does not need refill at this time. These meds are prescribed by psychiatrist and have refills available when she needs  them.   Objective:  Lab Results  Component Value Date   HGBA1C 5.2 08/06/2022    Lab Results  Component Value Date   CREATININE 0.71 08/06/2022   BUN 12 08/06/2022   NA 138 08/06/2022   K 4.3 08/06/2022   CL 100 08/06/2022   CO2 22 08/06/2022    Lab Results  Component Value Date   CHOL 121 08/06/2022   HDL 50 08/06/2022   LDLCALC 60 08/06/2022   TRIG 46 08/06/2022   CHOLHDL 2.4 08/06/2022    Medications Reviewed Today     Reviewed by Henrene Pastor, RPH-CPP (Pharmacist) on 06/25/23 at 1452  Med List Status: <None>   Medication Order Taking? Sig Documenting Provider Last Dose Status Informant  albuterol (VENTOLIN HFA) 108 (90 Base) MCG/ACT inhaler [id number] Yes Inhale 2-4 puffs into the lungs every 4 (four) hours as needed for wheezing (or cough). Simmons-Robinson, Makiera, MD Taking Active   divalproex (DEPAKOTE ER) 500 MG 24 hr tablet [id number] Yes Take 2 tablets (1,000 mg total) by mouth at bedtime.  Taking Active   fluticasone (FLOVENT HFA) 220 MCG/ACT inhaler [id number] No Inhale 1 puff into the lungs in the morning and at bedtime.  Patient not taking: Reported on 06/25/2023   Jacky Kindle, FNP Not Taking Active   Fluticasone-Umeclidin-Vilant [facility] ELLIPTA) 200-62.5-25 MCG/ACT AEPB [id number] Yes Inhale 1 puff into the lungs daily. Jacky Kindle, FNP Taking Active   pantoprazole (PROTONIX) 40 MG tablet [id number] Yes Take 1 tablet (40 mg total) by mouth daily. Simmons-Robinson, Tawanna Cooler, MD Taking Active   traZODone (DESYREL) 150 MG tablet [id number] Yes  Take 0.5-1 tablets (75-150 mg total) by mouth at bedtime as needed for sleep - take at least 1/2 hour before bed  Taking Active   Ubrogepant (UBRELVY) 50 MG TABS [id number] Yes Take 1 tablet (50 mg total) by mouth as needed. Simmons-Robinson, Tawanna Cooler, MD Taking Active             SDOH Interventions Today    Flowsheet Row Most Recent Value  SDOH Interventions   Financial Strain Interventions Other (Comment)   [researched Medicaid formulary and coordinated with pharmacy]       Assessment/Plan:  Medication Management: Called [facility] to check which pharmacy benefits they had for patient. They had info for Medicaid and they were able to run fluticasone HFA inhaler for $4.   If PCP wants patient continue triple therapy - Trelegy is not a preferred drug on medicaid formulary.   Other options would be to use fluticasone HFA and a LAMA/LABA combo like Anoro or Stiloto which are in preferred drug list.   Will forward information to PCP.   Also checked and Bernita Raisin that patient received samples of yesterday is also on Medicaid formulary.   Henrene Pastor, PharmD Clinical Pharmacist [facility] [phone]

## 2023-07-07 ENCOUNTER — Other Ambulatory Visit: Payer: Self-pay

## 2023-07-20 ENCOUNTER — Other Ambulatory Visit: Payer: Self-pay

## 2023-07-27 ENCOUNTER — Ambulatory Visit: Payer: Self-pay

## 2023-07-27 ENCOUNTER — Telehealth: Payer: Self-pay | Admitting: Family Medicine

## 2023-07-27 ENCOUNTER — Ambulatory Visit: Payer: MEDICAID | Admitting: Family Medicine

## 2023-07-27 NOTE — Progress Notes (Deleted)
      Established patient visit   Patient: Kara Lawrence   DOB: Jan 17, 1976   47 y.o. Female  MRN: [id number] Visit Date: 07/27/2023  Today's healthcare provider: Ronnald Ramp, MD   No chief complaint on file.  Subjective       Discussed the use of AI scribe software for clinical note transcription with the patient, who gave verbal consent to proceed.  History of Present Illness          Asthma  Started on fluticasone twice daily   Migraine  Started on ubrelvy 50mg  as needed for headache    Medications: Outpatient Medications Prior to Visit  Medication Sig   albuterol (VENTOLIN HFA) 108 (90 Base) MCG/ACT inhaler Inhale 2-4 puffs into the lungs every 4 (four) hours as needed for wheezing (or cough).   divalproex (DEPAKOTE ER) 500 MG 24 hr tablet Take 2 tablets (1,000 mg total) by mouth at bedtime.   fluticasone (FLOVENT HFA) 220 MCG/ACT inhaler Inhale 1 puff into the lungs in the morning and at bedtime.   pantoprazole (PROTONIX) 40 MG tablet Take 1 tablet (40 mg total) by mouth daily.   traZODone (DESYREL) 150 MG tablet Take 0.5-1 tablets (75-150 mg total) by mouth at bedtime as needed for sleep - take at least 1/2 hour before bed   Ubrogepant (UBRELVY) 50 MG TABS Take 1 tablet (50 mg total) by mouth as needed.   No facility-administered medications prior to visit.    Review of Systems  {Insert previous labs (optional):23779} {See past labs  Heme  Chem  Endocrine  Serology  Results Review (optional):1}   Objective    LMP 10/25/2019 (Approximate)  {Insert last BP/Wt (optional):23777}{See vitals history (optional):1}   Physical Exam  ***  No results found for any visits on 07/27/23.  Assessment & Plan     Problem List Items Addressed This Visit   None   Assessment and Plan              No follow-ups on file.         Ronnald Ramp, MD  [facility] [phone]  (phone) [phone] (fax)  [facility]

## 2023-07-27 NOTE — Telephone Encounter (Signed)
Please follow up to see if patient still having trouble breathing as reported to triage nurse. If so she may need earlier appt or to be seen in UC if she is not able to make appt with PCP.

## 2023-07-27 NOTE — Telephone Encounter (Signed)
  Chief Complaint: wanting an appt /difficulty breathing-pt stated has been using inhlaer  Symptoms: pt upset and wouldn't elaborate further just complaining about appts  Disposition: [] ED /[] Urgent Care (no appt availability in office) / [] Appointment(In office/virtual)/ []  [facility]/ [] Home Care/ [] Refused Recommended Disposition /[] [facility]/ [x]  Follow-up with PCP Additional Notes: pt argumentative with NT and despite offers of appts she refused. Pt was rude throughout call and hung on NT Called office and was advised by ?[place] no clinical staff was available.  Reason for Disposition  [1] Angry or rude caller AND [2] doesn't respond to 5 minutes of triager counseling AND [3] sick adult (or caller)  Answer Assessment - Initial Assessment Questions 1. SITUATION:  Document reason for call.     Pt wanted appt 2. BACKGROUND: Document any background information (e.g., prior calls, known psychiatric history)     Did not investigate 3. ASSESSMENT: Document your nursing assessment.     Pt started call wanted appt and offered appt tomorrow am - Pt wanted to argue during entire call. Attempted several times with alternative appts and advised there is an 8 am appt. Pt said Thank you and hung up  Protocols used: Difficult Call-A-AH

## 2023-07-28 ENCOUNTER — Ambulatory Visit: Payer: MEDICAID | Admitting: Family Medicine

## 2023-07-28 NOTE — Progress Notes (Deleted)
  Established patient visit  Patient: Kara Lawrence   DOB: 11-24-1976   47 y.o. Female  MRN: [id number] Visit Date: 07/29/2023  Today's healthcare provider: Debera Lat, PA-C   No chief complaint on file.  Subjective    HPI  *** Discussed the use of AI scribe software for clinical note transcription with the patient, who gave verbal consent to proceed.  History of Present Illness                No data to display             No data to display          Medications: Outpatient Medications Prior to Visit  Medication Sig   albuterol (VENTOLIN HFA) 108 (90 Base) MCG/ACT inhaler Inhale 2-4 puffs into the lungs every 4 (four) hours as needed for wheezing (or cough).   divalproex (DEPAKOTE ER) 500 MG 24 hr tablet Take 2 tablets (1,000 mg total) by mouth at bedtime.   fluticasone (FLOVENT HFA) 220 MCG/ACT inhaler Inhale 1 puff into the lungs in the morning and at bedtime.   pantoprazole (PROTONIX) 40 MG tablet Take 1 tablet (40 mg total) by mouth daily.   traZODone (DESYREL) 150 MG tablet Take 0.5-1 tablets (75-150 mg total) by mouth at bedtime as needed for sleep - take at least 1/2 hour before bed   Ubrogepant (UBRELVY) 50 MG TABS Take 1 tablet (50 mg total) by mouth as needed.   No facility-administered medications prior to visit.    Review of Systems Except see HPI   {Insert previous labs (optional):23779} {See past labs  Heme  Chem  Endocrine  Serology  Results Review (optional):1}   Objective    LMP 10/25/2019 (Approximate)  {Insert last BP/Wt (optional):23777}{See vitals history (optional):1}   Physical Exam   No results found for any visits on 07/29/23.  Assessment & Plan    *** Assessment and Plan              No follow-ups on file.      [facility]

## 2023-07-28 NOTE — Telephone Encounter (Signed)
Called patient to follow-up. Reports that she still having trouble. Offered appointment for tomorrow with Dr. Roxan Hockey at 3:40 pm per patient she already given appointment for Thursday. Check in patient chart and patient had an appointment for today with Dr. Sherrie Mustache and was cancelled and next appointment that shows patient scheduled is until September. Patient kept saying she has an appointment for Thursday. I insisted on the appointment for tomorrow and per patient not able to do the afternoon because she works and requested one in the morning. Patient scheduled for tomorrow morning at 8:20 am with Debera Lat.

## 2023-07-28 NOTE — Telephone Encounter (Signed)
I recommend sooner follow up for respiratory symptoms   If patient is not agreeable/uncooperative with scheduling, she may need UC visit to be evaluated acutely. I don't see she should wait until Sept if she is having breathing difficulties now. I will need to evaluate her if she needs a prescription change.

## 2023-07-29 ENCOUNTER — Ambulatory Visit: Payer: MEDICAID | Admitting: Physician Assistant

## 2023-08-18 ENCOUNTER — Other Ambulatory Visit: Payer: Self-pay

## 2023-08-18 MED ORDER — DIVALPROEX SODIUM ER 500 MG PO TB24
1000.0000 mg | ORAL_TABLET | Freq: Every day | ORAL | 0 refills | Status: DC
  Filled 2023-08-18 – 2023-08-19 (×2): qty 180, 90d supply, fill #0

## 2023-08-18 MED ORDER — DULOXETINE HCL 30 MG PO CPEP
30.0000 mg | ORAL_CAPSULE | Freq: Every day | ORAL | 0 refills | Status: DC
  Filled 2023-08-18: qty 90, 90d supply, fill #0

## 2023-08-18 MED ORDER — TRAZODONE HCL 150 MG PO TABS
75.0000 mg | ORAL_TABLET | Freq: Every evening | ORAL | 0 refills | Status: DC | PRN
  Filled 2023-08-18: qty 90, 90d supply, fill #0

## 2023-08-19 ENCOUNTER — Other Ambulatory Visit: Payer: Self-pay

## 2023-08-26 ENCOUNTER — Other Ambulatory Visit: Payer: Self-pay

## 2023-08-26 ENCOUNTER — Other Ambulatory Visit: Payer: Self-pay | Admitting: Family Medicine

## 2023-08-26 ENCOUNTER — Ambulatory Visit (INDEPENDENT_AMBULATORY_CARE_PROVIDER_SITE_OTHER): Payer: MEDICAID | Admitting: Family Medicine

## 2023-08-26 ENCOUNTER — Encounter: Payer: Self-pay | Admitting: Family Medicine

## 2023-08-26 VITALS — BP 117/90 | HR 85 | Ht 61.0 in | Wt 138.3 lb

## 2023-08-26 DIAGNOSIS — G43009 Migraine without aura, not intractable, without status migrainosus: Secondary | ICD-10-CM | POA: Diagnosis not present

## 2023-08-26 DIAGNOSIS — R030 Elevated blood-pressure reading, without diagnosis of hypertension: Secondary | ICD-10-CM | POA: Diagnosis not present

## 2023-08-26 DIAGNOSIS — J4541 Moderate persistent asthma with (acute) exacerbation: Secondary | ICD-10-CM

## 2023-08-26 DIAGNOSIS — R062 Wheezing: Secondary | ICD-10-CM

## 2023-08-26 DIAGNOSIS — Z5986 Financial insecurity: Secondary | ICD-10-CM

## 2023-08-26 DIAGNOSIS — J4540 Moderate persistent asthma, uncomplicated: Secondary | ICD-10-CM

## 2023-08-26 DIAGNOSIS — Z889 Allergy status to unspecified drugs, medicaments and biological substances status: Secondary | ICD-10-CM

## 2023-08-26 MED ORDER — SUMATRIPTAN SUCCINATE 25 MG PO TABS: 25.0000 mg | ORAL_TABLET | ORAL | 0 refills | Status: DC | PRN

## 2023-08-26 MED ORDER — FLUTICASONE PROPIONATE HFA 220 MCG/ACT IN AERO
1.0000 | INHALATION_SPRAY | Freq: Two times a day (BID) | RESPIRATORY_TRACT | 6 refills | Status: DC
  Filled 2023-08-26: qty 12, 60d supply, fill #0

## 2023-08-26 MED ORDER — ALBUTEROL SULFATE HFA 108 (90 BASE) MCG/ACT IN AERS
INHALATION_SPRAY | RESPIRATORY_TRACT | 12 refills | Status: AC
  Filled 2023-08-26: qty 18, 17d supply, fill #0

## 2023-08-26 MED ORDER — FLUTICASONE PROPIONATE HFA 220 MCG/ACT IN AERO: 1.0000 | INHALATION_SPRAY | Freq: Two times a day (BID) | RESPIRATORY_TRACT | 6 refills | Status: DC

## 2023-08-26 MED ORDER — ALBUTEROL SULFATE HFA 108 (90 BASE) MCG/ACT IN AERS: 2.0000 | INHALATION_SPRAY | RESPIRATORY_TRACT | 12 refills | Status: DC | PRN

## 2023-08-26 MED ORDER — SUMATRIPTAN SUCCINATE 25 MG PO TABS
25.0000 mg | ORAL_TABLET | ORAL | 0 refills | Status: DC
  Filled 2023-08-26: qty 9, 30d supply, fill #0

## 2023-08-26 NOTE — Assessment & Plan Note (Signed)
Uncontrolled due to lack of medication adherence. No current use of prescribed albuterol or Flovent. No wheezing on exam today. Unclear if patient is able to pick up prescriptions previously sent. Patient appears frustrated by questions regarding asthma prescriptions stating that the pharmacy told her that nothing has been prescribed  -confirmed outpatient [facility] pharmacy as preferred pharmacy  -previously referred pt to pharmacy for assistance with cost effective medications -Printed prescriptions for Albuterol 108 mcg (2-4 puffs every 4-6 hours as needed) and Flovent 220 mcg (1 puff twice daily) for patient to take to pharmacy. -Encourage patient to fill and use these medications consistently.

## 2023-08-26 NOTE — Assessment & Plan Note (Signed)
Migraine Daily headaches. Previous trial of Ubrelvy 50 mg was ineffective. -Print prescription for Imitrex 25mg  with instructions for use. -Refer to neurology for further management given frequency of headaches and failure of previous medication trial. -normal neuro exam today

## 2023-08-26 NOTE — Progress Notes (Signed)
Established patient visit   Patient: Kara Lawrence   DOB: 07-09-1976   47 y.o. Female  MRN: [id number] Visit Date: 08/26/2023  Today's healthcare provider: Ronnald Ramp, MD   Chief Complaint  Patient presents with   Medical Management of Chronic Issues    Follow up on Asthma and migraines   Subjective     HPI     Medical Management of Chronic Issues    Additional comments: Follow up on Asthma and migraines      Last edited by Rolly Salter, CMA on 08/26/2023  9:11 AM.       Discussed the use of AI scribe software for clinical note transcription with the patient, who gave verbal consent to proceed.  History of Present Illness   The patient, with a history of asthma and migraines, presents for a follow-up visit. She reports not having received any inhalers for her asthma, and are currently not using any medication for the condition. She expresses a need for medication for both her asthma and migraines. The patient has tried [place] for her migraines, but reports it was ineffective. She has not been able to work with the pharmacist to find a cost-effective solution for her medications.  The patient has been prescribed albuterol and Flovent for her asthma in the past, but it is unclear if she has been using these medications. She reports not having received these prescriptions from the pharmacy. The patient does not recall any specific medications that have been effective for her asthma in the past.  Regarding her migraines, the patient has tried Imitrex in the past. She reports having headaches daily. The patient does not currently see a neurologist for her migraines.  The patient's physical examination was unremarkable, with no wheezing noted on the day of the visit.         Past Medical History:  Diagnosis Date   Abnormal uterine bleeding 07/31/2019   Formatting of this note might be different from the original.  - Reports since menarche  (age unknown) menses have been regular in timing, x 6-7 days, however heavy in amount. On heaviest days saturates 1 pad/hour.  - Menses also consistently associated with dysmenorrhea, controlled with Tylenol however sometimes so severe she is unable to perform daily activities.  - Heavy menstrual bleeding endor   Allergy    Anxiety    Bipolar 1 disorder (HCC)    Depression    Glaucoma    Hepatic injury 10/24/2015   Manic depression (HCC)    Dr Suzie Portela, psychiatrist   Seizure [facility])     Medications: Outpatient Medications Prior to Visit  Medication Sig   divalproex (DEPAKOTE ER) 500 MG 24 hr tablet Take 2 tablets (1,000 mg total) by mouth at bedtime.   divalproex (DEPAKOTE ER) 500 MG 24 hr tablet Take 2 tablets (1,000 mg total) by mouth at bedtime.   DULoxetine (CYMBALTA) 30 MG capsule Take 1 capsule (30 mg total) by mouth daily.   pantoprazole (PROTONIX) 40 MG tablet Take 1 tablet (40 mg total) by mouth daily.   traZODone (DESYREL) 150 MG tablet Take 0.5-1 tablets (75-150 mg total) by mouth at bedtime as needed for sleep - take at least 1/2 hour before bed   traZODone (DESYREL) 150 MG tablet Take 0.5-1 tablets (75-150 mg total) by mouth at bedtime as needed. Take at least 30 minutes before bedtime.   [DISCONTINUED] albuterol (VENTOLIN HFA) 108 (90 Base) MCG/ACT inhaler Inhale 2-4 puffs into the lungs  every 4 (four) hours as needed for wheezing (or cough).   [DISCONTINUED] fluticasone (FLOVENT HFA) 220 MCG/ACT inhaler Inhale 1 puff into the lungs in the morning and at bedtime.   [DISCONTINUED] Ubrogepant (UBRELVY) 50 MG TABS Take 1 tablet (50 mg total) by mouth as needed.   No facility-administered medications prior to visit.    Review of Systems      Objective    BP (!) 117/90 (BP Location: Left Arm, Patient Position: Sitting, Cuff Size: Normal)   Pulse 85   Ht 5\' 1"  (1.549 m)   Wt 138 lb 4.8 oz (62.7 kg)   LMP 10/25/2019 (Approximate)   SpO2 100%   BMI 26.13 kg/m      Physical Exam Vitals reviewed.  Constitutional:      General: She is not in acute distress.    Appearance: Normal appearance. She is not ill-appearing, toxic-appearing or diaphoretic.  Eyes:     Conjunctiva/sclera: Conjunctivae normal.  Cardiovascular:     Rate and Rhythm: Normal rate and regular rhythm.     Pulses: Normal pulses.     Heart sounds: Normal heart sounds. No murmur heard.    No friction rub. No gallop.  Pulmonary:     Effort: Pulmonary effort is normal. No respiratory distress.     Breath sounds: Normal breath sounds. No stridor. No wheezing, rhonchi or rales.     Comments: Poor effort with request for deep respirations  Abdominal:     General: Bowel sounds are normal. There is no distension.     Palpations: Abdomen is soft.     Tenderness: There is no abdominal tenderness.  Musculoskeletal:     Right lower leg: No edema.     Left lower leg: No edema.  Skin:    Findings: No erythema or rash.  Neurological:     Mental Status: She is alert and oriented to person, place, and time.  Psychiatric:        Attention and Perception: Attention normal.        Mood and Affect: Affect is angry.        Behavior: Behavior is agitated.     Comments: Limited communication, short answers only        No results found for any visits on 08/26/23.  Assessment & Plan     Problem List Items Addressed This Visit     RESOLVED: Elevated blood pressure reading in office without diagnosis of hypertension   Migraine headache without aura    Migraine Daily headaches. Previous trial of Ubrelvy 50 mg was ineffective. -Print prescription for Imitrex 25mg  with instructions for use. -Refer to neurology for further management given frequency of headaches and failure of previous medication trial. -normal neuro exam today       Relevant Medications   SUMAtriptan (IMITREX) 25 MG tablet   Other Relevant Orders   Ambulatory referral to Neurology   Moderate persistent asthma -  Primary    Uncontrolled due to lack of medication adherence. No current use of prescribed albuterol or Flovent. No wheezing on exam today. Unclear if patient is able to pick up prescriptions previously sent. Patient appears frustrated by questions regarding asthma prescriptions stating that the pharmacy told her that nothing has been prescribed  -confirmed outpatient [facility] pharmacy as preferred pharmacy  -previously referred pt to pharmacy for assistance with cost effective medications -Printed prescriptions for Albuterol 108 mcg (2-4 puffs every 4-6 hours as needed) and Flovent 220 mcg (1 puff twice daily) for patient to  take to pharmacy. -Encourage patient to fill and use these medications consistently.      Relevant Medications   albuterol (VENTOLIN HFA) 108 (90 Base) MCG/ACT inhaler   fluticasone (FLOVENT HFA) 220 MCG/ACT inhaler   Patient cannot afford medications   Other Visit Diagnoses     Wheezing       Relevant Medications   albuterol (VENTOLIN HFA) 108 (90 Base) MCG/ACT inhaler   Moderate persistent asthma with exacerbation       Relevant Medications   albuterol (VENTOLIN HFA) 108 (90 Base) MCG/ACT inhaler   fluticasone (FLOVENT HFA) 220 MCG/ACT inhaler        Return in about 2 months (around 10/26/2023) for CPE.       Ronnald Ramp, MD  [facility] [phone] (phone) [phone] (fax)  [facility]

## 2023-08-28 ENCOUNTER — Other Ambulatory Visit: Payer: Self-pay

## 2023-09-08 IMAGING — CR DG SHOULDER 2+V*R*
1 series · 4 of 4 positions shown · non-contrast
Comparison: None.

CLINICAL DATA: Right shoulder pain.

EXAM:
RIGHT SHOULDER - 2+ VIEW

[Series 1: dg shoulder right · 0.14mm/px · 4 of 4 slices shown]
[im 1/4]
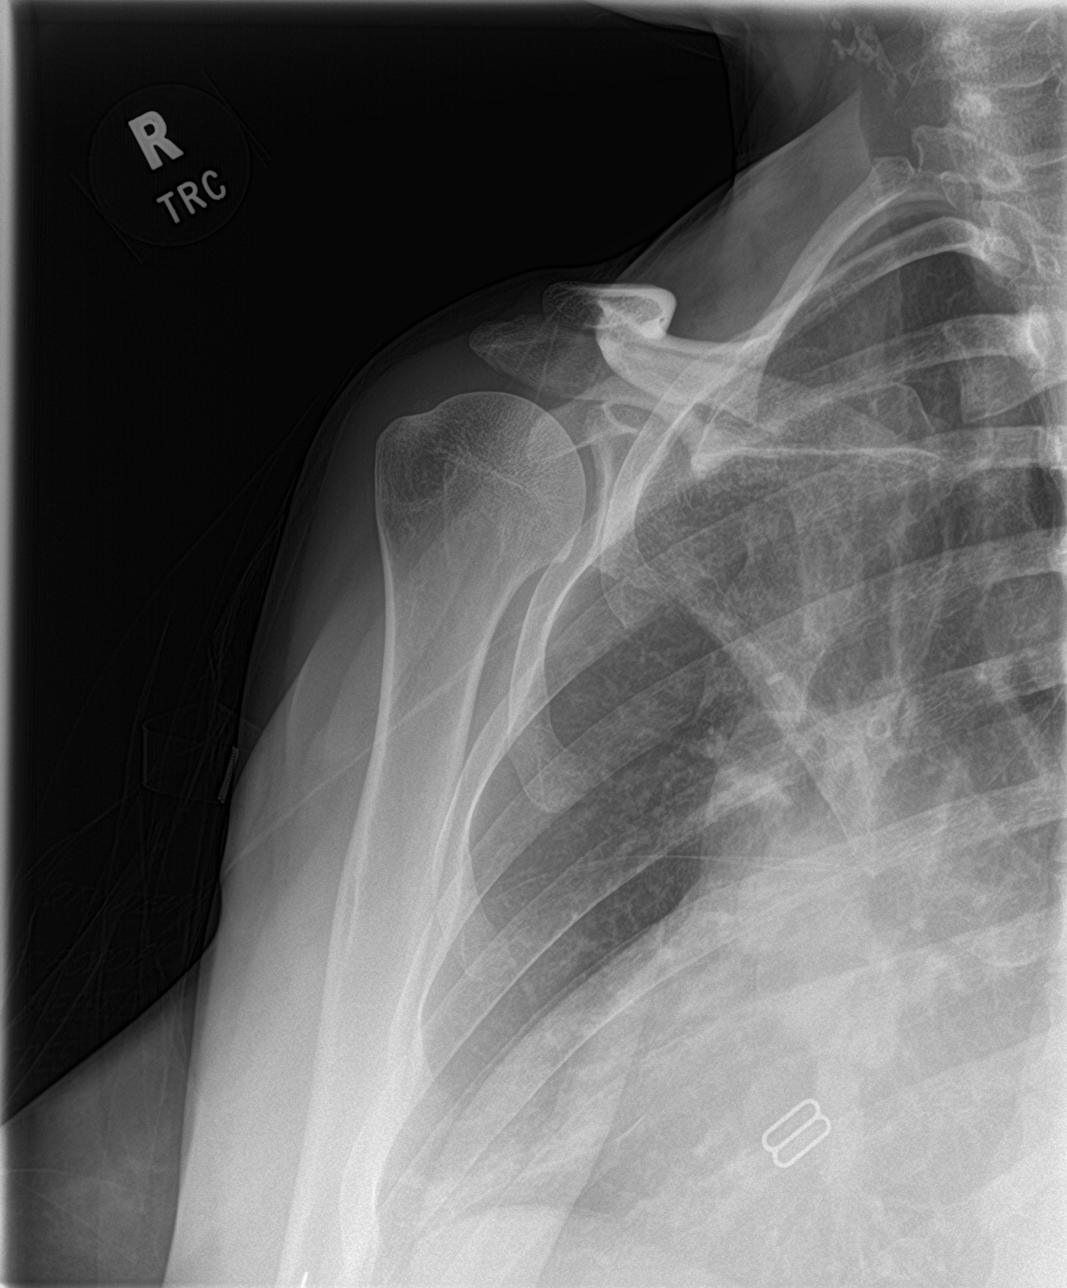
[im 2/4]
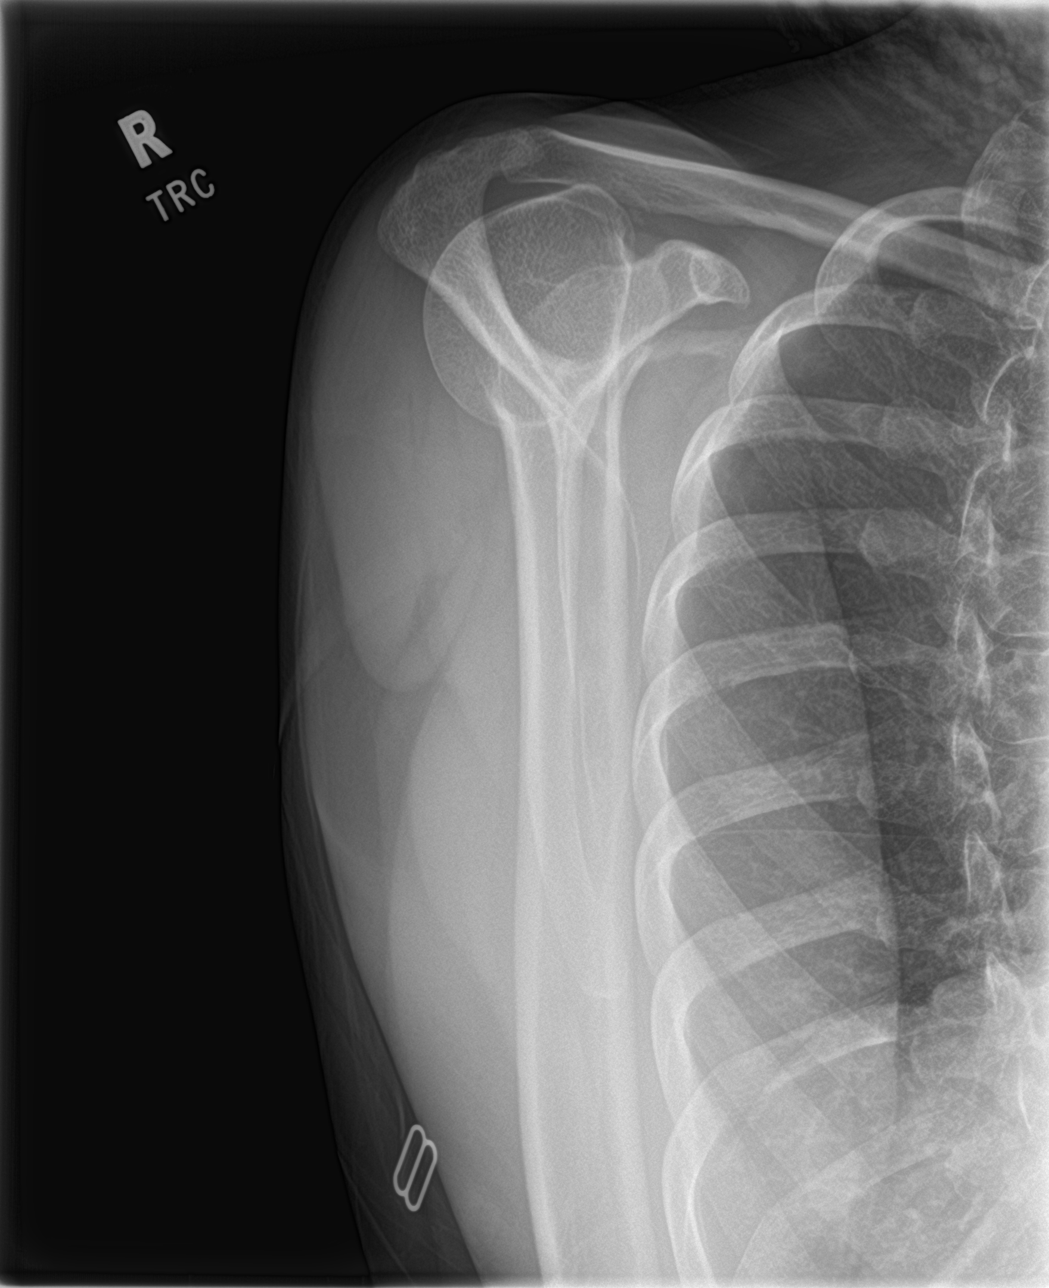
[im 3/4]
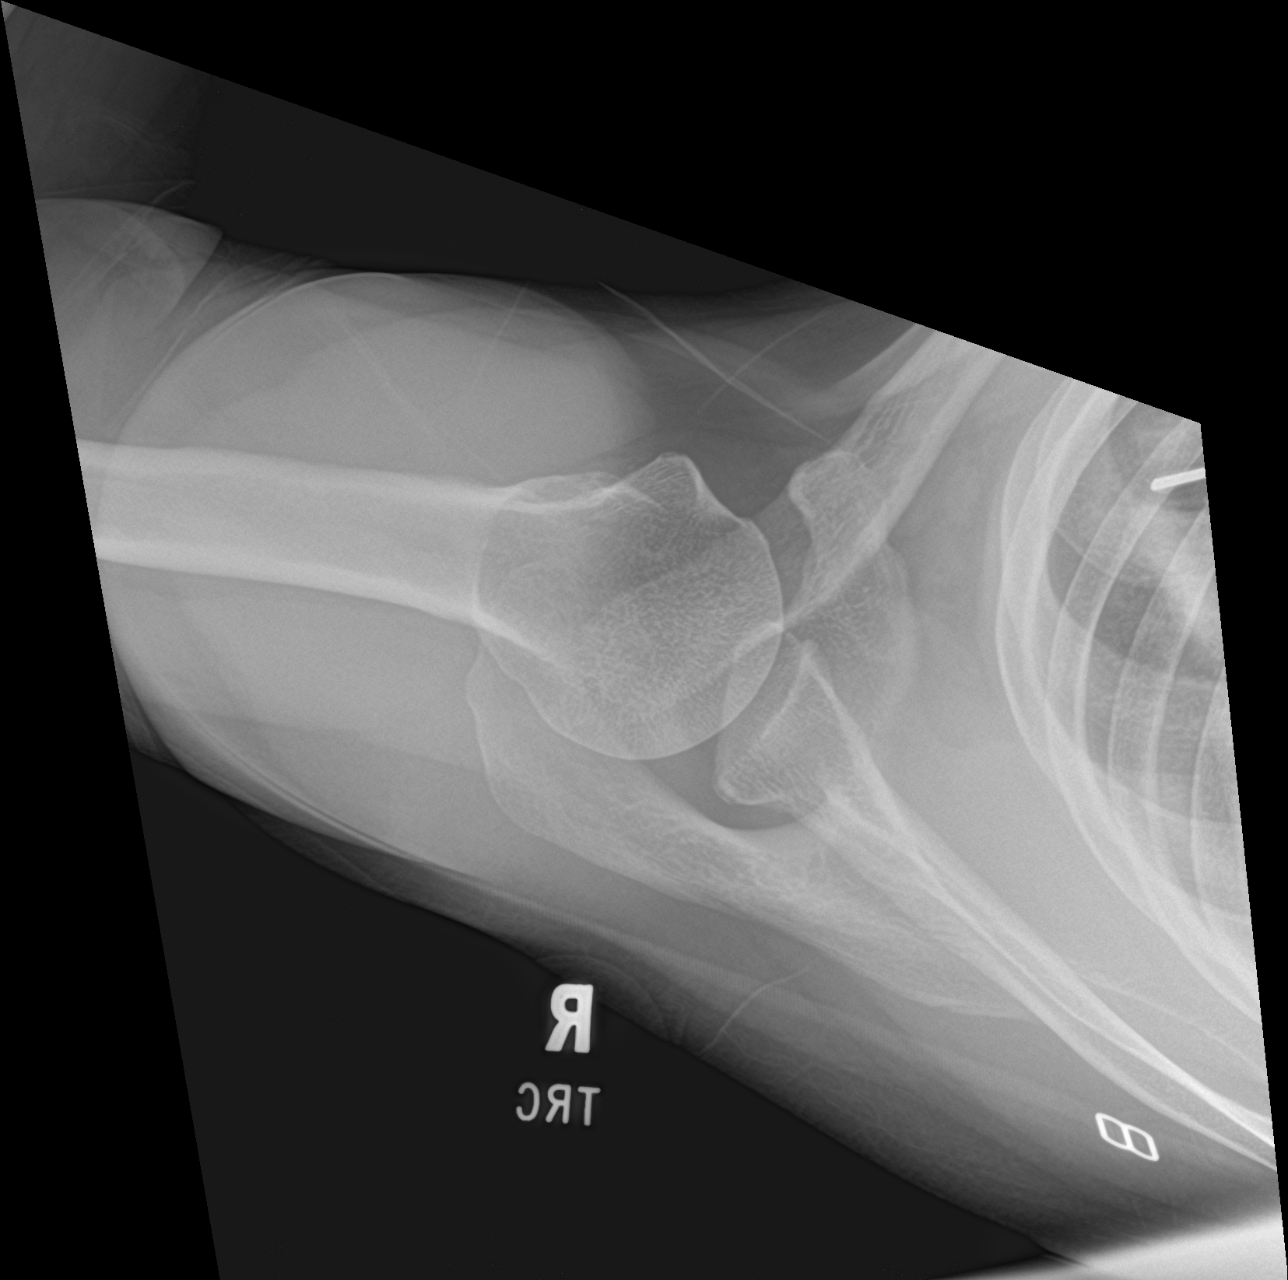
[im 4/4]
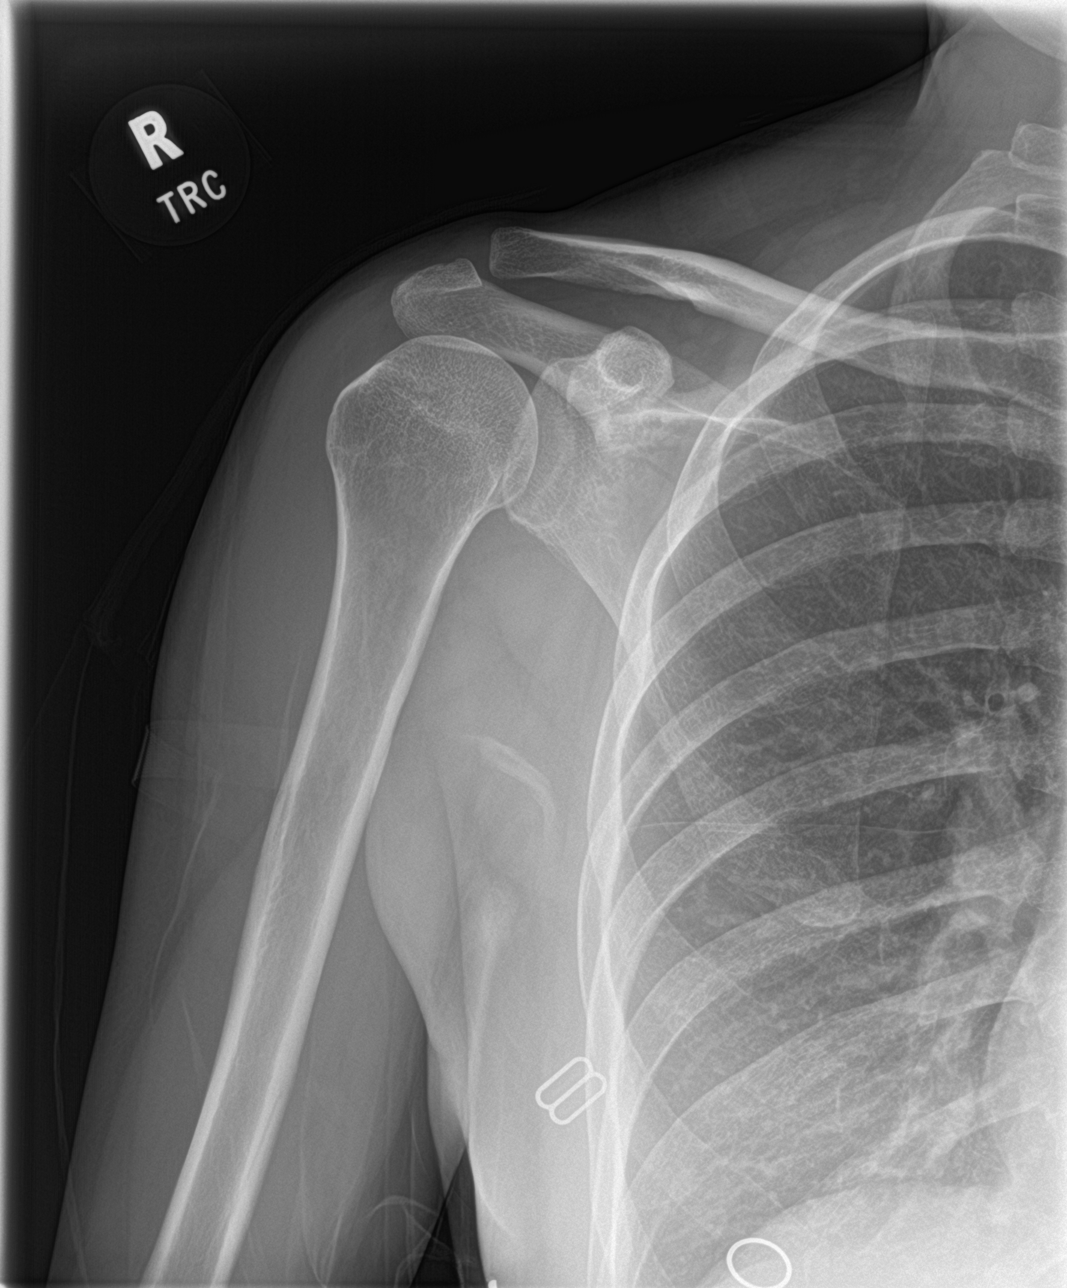

[4 of 4 positions shown; findings below may reference images not displayed]

FINDINGS: There is no evidence of fracture or dislocation. There is no
evidence of arthropathy or other focal bone abnormality. Soft
tissues are unremarkable.
IMPRESSION: Negative.

## 2023-09-29 ENCOUNTER — Other Ambulatory Visit: Payer: Self-pay

## 2023-10-07 ENCOUNTER — Other Ambulatory Visit: Payer: Self-pay

## 2023-10-07 MED ORDER — METHYLPREDNISOLONE 4 MG PO TBPK
ORAL_TABLET | ORAL | 0 refills | Status: DC
  Filled 2023-10-07: qty 21, 6d supply, fill #0

## 2023-10-07 MED ORDER — NORTRIPTYLINE HCL 10 MG PO CAPS
ORAL_CAPSULE | Freq: Every evening | ORAL | 3 refills | Status: DC
  Filled 2023-10-07: qty 90, 27d supply, fill #0

## 2023-12-01 ENCOUNTER — Ambulatory Visit: Payer: MEDICAID | Admitting: Family Medicine

## 2023-12-01 NOTE — Progress Notes (Deleted)
Established patient visit   Patient: Kara Lawrence   DOB: [birth year]   47 y.o. Female  MRN: [id number] Visit Date: 12/01/2023  Today's healthcare provider: Ronnald Ramp, MD   No chief complaint on file.  Subjective       Discussed the use of AI scribe software for clinical note transcription with the patient, who gave verbal consent to proceed.  History of Present Illness             Past Medical History:  Diagnosis Date   Abnormal uterine bleeding 07/31/2019   Formatting of this note might be different from the original.  - Reports since menarche (age unknown) menses have been regular in timing, x 6-7 days, however heavy in amount. On heaviest days saturates 1 pad/hour.  - Menses also consistently associated with dysmenorrhea, controlled with Tylenol however sometimes so severe she is unable to perform daily activities.  - Heavy menstrual bleeding endor   Allergy    Anxiety    Bipolar 1 disorder (HCC)    Depression    Glaucoma    Hepatic injury 10/24/2015   Manic depression (HCC)    Dr Suzie Portela, psychiatrist   Seizure [facility])     Medications: Outpatient Medications Prior to Visit  Medication Sig   albuterol (VENTOLIN HFA) 108 (90 Base) MCG/ACT inhaler Inhale 2-4 puffs into the lungs every 4 (four) hours as needed for wheezing (or cough).   albuterol (VENTOLIN HFA) 108 (90 Base) MCG/ACT inhaler Inhale 2 to 4 puffs into the lungs once every 4 hours as needed for wheezing or cough.   divalproex (DEPAKOTE ER) 500 MG 24 hr tablet Take 2 tablets (1,000 mg total) by mouth at bedtime.   divalproex (DEPAKOTE ER) 500 MG 24 hr tablet Take 2 tablets (1,000 mg total) by mouth at bedtime.   DULoxetine (CYMBALTA) 30 MG capsule Take 1 capsule (30 mg total) by mouth daily.   fluticasone (FLOVENT HFA) 220 MCG/ACT inhaler Inhale 1 puff into the lungs in the morning and at bedtime.   fluticasone (FLOVENT HFA) 220 MCG/ACT inhaler Inhale 1 puff into the lungs in  the morning and at bedtime.   methylPREDNISolone (MEDROL DOSEPAK) 4 MG TBPK tablet Take 6 tablets by mouth once daily on Day 1. Then decrease by one tablet daily until complete. (6, 5, 4, 3, 2, 1, then stop).   nortriptyline (PAMELOR) 10 MG capsule Take 1 capsule (10 mg) by mouth once daily at night for 3 days. Then take 2 capsules (20 mg) at night for 3 days. Then take 3 capsules (30 mg) nightly for headaches. (May take an extra 1 caspule (10 mg) as needed for rescue).   pantoprazole (PROTONIX) 40 MG tablet Take 1 tablet (40 mg total) by mouth daily.   SUMAtriptan (IMITREX) 25 MG tablet Take 1 tablet (25 mg total) by mouth every 2 (two) hours as needed for migraine. May repeat in 2 hours if headache persists or recurs.   SUMAtriptan (IMITREX) 25 MG tablet Take 1 tablet (25 mg total) by mouth daily as needed for migraine. May repeat in 2 hours if headache persists or recurs.   traZODone (DESYREL) 150 MG tablet Take 0.5-1 tablets (75-150 mg total) by mouth at bedtime as needed for sleep - take at least 1/2 hour before bed   traZODone (DESYREL) 150 MG tablet Take 0.5-1 tablets (75-150 mg total) by mouth at bedtime as needed. Take at least 30 minutes before bedtime.   No facility-administered medications prior  to visit.    Review of Systems  {Insert previous labs (optional):23779} {See past labs  Heme  Chem  Endocrine  Serology  Results Review (optional):1}   Objective    LMP 10/25/2019 (Approximate)  {Insert last BP/Wt (optional):23777}{See vitals history (optional):1}    Physical Exam  ***  No results found for any visits on 12/01/23.  Assessment & Plan     Problem List Items Addressed This Visit   None   Assessment and Plan              No follow-ups on file.         Ronnald Ramp, MD  [facility] [phone] (phone) [phone] (fax)  [facility]

## 2023-12-22 ENCOUNTER — Other Ambulatory Visit: Payer: Self-pay | Admitting: Family Medicine

## 2023-12-22 DIAGNOSIS — K2100 Gastro-esophageal reflux disease with esophagitis, without bleeding: Secondary | ICD-10-CM

## 2023-12-22 NOTE — Telephone Encounter (Signed)
 Medication Refill -  Most Recent Primary Care Visit:  Provider: SIMMONS-ROBINSON, MAKIERA  Department: [facility]  Visit Type: OFFICE VISIT  Date: 08/26/2023  Medication: pantoprazole  (PROTONIX ) 40 MG tablet   Has the patient contacted their pharmacy? Yes (Agent: If no, request that the patient contact the pharmacy for the refill. If patient does not wish to contact the pharmacy document the reason why and proceed with request.) (Agent: If yes, when and what did the pharmacy advise?)  Is this the correct pharmacy for this prescription? Yes If no, delete pharmacy and type the correct one.  This is the patient's preferred pharmacy:  [facility] [address] [place] [address] Phone: [phone] Fax: [phone]   Has the prescription been filled recently? Yes  Is the patient out of the medication? Yes  Has the patient been seen for an appointment in the last year OR does the patient have an upcoming appointment? Yes  Can we respond through MyChart? Yes  Agent: Please be advised that Rx refills may take up to 3 business days. We ask that you follow-up with your pharmacy.

## 2023-12-24 ENCOUNTER — Other Ambulatory Visit: Payer: Self-pay

## 2023-12-24 MED ORDER — PANTOPRAZOLE SODIUM 40 MG PO TBEC
40.0000 mg | DELAYED_RELEASE_TABLET | Freq: Every day | ORAL | 1 refills | Status: DC
  Filled 2023-12-24: qty 90, 90d supply, fill #0
  Filled 2024-03-08: qty 90, 90d supply, fill #1

## 2023-12-24 NOTE — Telephone Encounter (Signed)
 Requested Prescriptions  Pending Prescriptions Disp Refills   pantoprazole  (PROTONIX ) 40 MG tablet 90 tablet 1    Sig: Take 1 tablet (40 mg total) by mouth daily.     Gastroenterology: Proton Pump Inhibitors Passed - 12/24/2023  3:36 PM      Passed - Valid encounter within last 12 months    Recent Outpatient Visits           4 months ago Moderate persistent asthma without complication   [facility] Simmons-Robinson, [place], MD   6 months ago Moderate persistent asthma with exacerbation   [facility] Plummer, [place], MD   7 months ago Moderate persistent asthma with exacerbation   [facility] Emilio Kelly DASEN, FNP   9 months ago Wheezing   [facility] Simmons-Robinson, [place], MD   10 months ago Episodic cluster headache, not intractable   [facility] Simmons-Robinson, [place], MD       Future Appointments             In 2 weeks Simmons-Robinson, [place], MD [facility] [place]

## 2024-01-05 ENCOUNTER — Other Ambulatory Visit: Payer: Self-pay

## 2024-01-05 MED ORDER — TRAZODONE HCL 100 MG PO TABS
100.0000 mg | ORAL_TABLET | Freq: Every evening | ORAL | 1 refills | Status: DC | PRN
  Filled 2024-01-05: qty 60, 30d supply, fill #0

## 2024-01-05 MED ORDER — DIVALPROEX SODIUM ER 500 MG PO TB24
1000.0000 mg | ORAL_TABLET | Freq: Every evening | ORAL | 0 refills | Status: DC
  Filled 2024-01-05 – 2024-01-28 (×2): qty 180, 90d supply, fill #0

## 2024-01-07 ENCOUNTER — Ambulatory Visit: Payer: MEDICAID | Admitting: Family Medicine

## 2024-01-19 ENCOUNTER — Other Ambulatory Visit: Payer: Self-pay

## 2024-01-28 ENCOUNTER — Ambulatory Visit: Payer: MEDICAID | Admitting: Family Medicine

## 2024-01-28 ENCOUNTER — Telehealth: Payer: Self-pay | Admitting: Family Medicine

## 2024-01-28 ENCOUNTER — Encounter: Payer: Self-pay | Admitting: Family Medicine

## 2024-01-28 ENCOUNTER — Other Ambulatory Visit: Payer: Self-pay

## 2024-01-28 VITALS — BP 126/90 | HR 90 | Temp 99.0°F | Resp 16 | Ht 61.0 in | Wt 132.0 lb

## 2024-01-28 DIAGNOSIS — R569 Unspecified convulsions: Secondary | ICD-10-CM

## 2024-01-28 DIAGNOSIS — K2100 Gastro-esophageal reflux disease with esophagitis, without bleeding: Secondary | ICD-10-CM | POA: Diagnosis not present

## 2024-01-28 DIAGNOSIS — G43009 Migraine without aura, not intractable, without status migrainosus: Secondary | ICD-10-CM | POA: Diagnosis not present

## 2024-01-28 DIAGNOSIS — J4540 Moderate persistent asthma, uncomplicated: Secondary | ICD-10-CM | POA: Diagnosis not present

## 2024-01-28 DIAGNOSIS — F5101 Primary insomnia: Secondary | ICD-10-CM

## 2024-01-28 MED ORDER — NORTRIPTYLINE HCL 10 MG PO CAPS
ORAL_CAPSULE | Freq: Every evening | ORAL | 3 refills | Status: DC
  Filled 2024-01-28: qty 90, 22d supply, fill #0

## 2024-01-28 MED ORDER — TRAZODONE HCL 50 MG PO TABS
75.0000 mg | ORAL_TABLET | Freq: Every evening | ORAL | 1 refills | Status: DC | PRN
  Filled 2024-01-28: qty 270, 90d supply, fill #0

## 2024-01-28 MED ORDER — TRELEGY ELLIPTA 200-62.5-25 MCG/ACT IN AEPB
1.0000 | INHALATION_SPRAY | Freq: Every day | RESPIRATORY_TRACT | 3 refills | Status: AC
  Filled 2024-01-28 – 2024-03-08 (×2): qty 60, 30d supply, fill #0
  Filled 2024-08-10: qty 60, 30d supply, fill #1

## 2024-01-28 NOTE — Telephone Encounter (Signed)
[facility] need prior authorization Key: [facility] Trelegy Ellipta [id number] MCG/ACT Aerosol Powder

## 2024-01-28 NOTE — Assessment & Plan Note (Signed)
Chronic, stable  GERD managed with Protonix 40 mg daily. No current issues reported. - Continue Protonix 40 mg daily

## 2024-01-28 NOTE — Assessment & Plan Note (Signed)
Chronic migraines, previously managed with nortriptyline 30 mg nightly. She has not been taking the medication due to lack of prescription refills. Initial improvement noted with nortriptyline. Discussed titration schedule to minimize side effects and ensure tolerance. Chronic, stable  - Restart nortriptyline with titration: 10 mg once nightly for 3 days, then 20 mg nightly for 3 days, then 30 mg nightly continuously - Send prescription for nortriptyline to the pharmacy

## 2024-01-28 NOTE — Assessment & Plan Note (Signed)
Chronic,stable, no recent seizures Seizures well-controlled on Depakote 1000 mg daily at bedtime. No recent seizures reported. - Continue Depakote 1000 mg daily at bedtime

## 2024-01-28 NOTE — Progress Notes (Signed)
Established patient visit   Patient: Kara Lawrence   DOB: 1976/03/10   48 y.o. Female  MRN: [id number] Visit Date: 01/28/2024  Today's healthcare provider: Ronnald Ramp, MD   No chief complaint on file.  Subjective       Discussed the use of AI scribe software for clinical note transcription with the patient, who gave verbal consent to proceed.  History of Present Illness   Kara Lawrence is a 48 year old female with migraines, asthma, and seizures who presents for chronic follow-up.  She has a history of migraines and was referred to neurology last year, where she was started on nortriptyline. She should be taking 30 mg every night, but has not been able to start or tolerate the medication due to lack of access. Her headaches improved initially after the neurology visit, but she has not been back since.  She has moderate persistent asthma. Her current medications include albuterol 108 mcg, taken two to four puffs every four hours as needed, and fluticasone 220 mcg taken in the morning and at bedtime. She was also given Trelegy by another doctor, which she found helpful, but she has not pursued a prescription due to cost concerns.  She has a history of seizures and is currently on Depakote 1000 mg daily at bedtime. No seizures have occurred since her last visit.  She mentions difficulty affording medications and continues to use the hospital pharmacy for her prescriptions.  She is on Protonix 40 mg for gastric reflux and reports no current issues with reflux.  For insomnia, she takes trazodone 150 mg as needed.         Past Medical History:  Diagnosis Date   Abnormal uterine bleeding 07/31/2019   Formatting of this note might be different from the original.  - Reports since menarche (age unknown) menses have been regular in timing, x 6-7 days, however heavy in amount. On heaviest days saturates 1 pad/hour.  - Menses also consistently  associated with dysmenorrhea, controlled with Tylenol however sometimes so severe she is unable to perform daily activities.  - Heavy menstrual bleeding endor   Allergy    Anxiety    Bipolar 1 disorder (HCC)    Depression    Glaucoma    Hepatic injury 10/24/2015   Manic depression (HCC)    Dr Suzie Portela, psychiatrist   Seizure [facility])     Medications: Outpatient Medications Prior to Visit  Medication Sig   albuterol (VENTOLIN HFA) 108 (90 Base) MCG/ACT inhaler Inhale 2-4 puffs into the lungs every 4 (four) hours as needed for wheezing (or cough).   albuterol (VENTOLIN HFA) 108 (90 Base) MCG/ACT inhaler Inhale 2 to 4 puffs into the lungs once every 4 hours as needed for wheezing or cough.   divalproex (DEPAKOTE ER) 500 MG 24 hr tablet Take 2 tablets (1,000 mg total) by mouth at bedtime.   pantoprazole (PROTONIX) 40 MG tablet Take 1 tablet (40 mg total) by mouth daily.   [DISCONTINUED] divalproex (DEPAKOTE ER) 500 MG 24 hr tablet Take 2 tablets (1,000 mg total) by mouth at bedtime.   [DISCONTINUED] divalproex (DEPAKOTE ER) 500 MG 24 hr tablet Take 2 tablets (1,000 mg total) by mouth at bedtime.   [DISCONTINUED] DULoxetine (CYMBALTA) 30 MG capsule Take 1 capsule (30 mg total) by mouth daily.   [DISCONTINUED] fluticasone (FLOVENT HFA) 220 MCG/ACT inhaler Inhale 1 puff into the lungs in the morning and at bedtime.   [DISCONTINUED] fluticasone (FLOVENT HFA) 220  MCG/ACT inhaler Inhale 1 puff into the lungs in the morning and at bedtime.   [DISCONTINUED] methylPREDNISolone (MEDROL DOSEPAK) 4 MG TBPK tablet Take 6 tablets by mouth once daily on Day 1. Then decrease by one tablet daily until complete. (6, 5, 4, 3, 2, 1, then stop).   [DISCONTINUED] nortriptyline (PAMELOR) 10 MG capsule Take 1 capsule (10 mg) by mouth once daily at night for 3 days. Then take 2 capsules (20 mg) at night for 3 days. Then take 3 capsules (30 mg) nightly for headaches. (May take an extra 1 caspule (10 mg) as needed for  rescue).   [DISCONTINUED] SUMAtriptan (IMITREX) 25 MG tablet Take 1 tablet (25 mg total) by mouth every 2 (two) hours as needed for migraine. May repeat in 2 hours if headache persists or recurs.   [DISCONTINUED] SUMAtriptan (IMITREX) 25 MG tablet Take 1 tablet (25 mg total) by mouth daily as needed for migraine. May repeat in 2 hours if headache persists or recurs.   [DISCONTINUED] traZODone (DESYREL) 100 MG tablet Take 1-2 tablets (100-200 mg total) by mouth at bedtime as needed for sleep   [DISCONTINUED] traZODone (DESYREL) 150 MG tablet Take 0.5-1 tablets (75-150 mg total) by mouth at bedtime as needed for sleep - take at least 1/2 hour before bed   [DISCONTINUED] traZODone (DESYREL) 150 MG tablet Take 0.5-1 tablets (75-150 mg total) by mouth at bedtime as needed. Take at least 30 minutes before bedtime.   No facility-administered medications prior to visit.    Review of Systems  Last metabolic panel Lab Results  Component Value Date   GLUCOSE 80 08/06/2022   NA 138 08/06/2022   K 4.3 08/06/2022   CL 100 08/06/2022   CO2 22 08/06/2022   BUN 12 08/06/2022   CREATININE 0.71 08/06/2022   EGFR 105 08/06/2022   CALCIUM 9.4 08/06/2022   PROT 7.6 08/06/2022   ALBUMIN 4.3 08/06/2022   LABGLOB 3.3 08/06/2022   AGRATIO 1.3 08/06/2022   BILITOT 0.3 08/06/2022   ALKPHOS 89 08/06/2022   AST 35 08/06/2022   ALT 32 08/06/2022   ANIONGAP 23 (H) 07/19/2019   Last lipids Lab Results  Component Value Date   CHOL 121 08/06/2022   HDL 50 08/06/2022   LDLCALC 60 08/06/2022   TRIG 46 08/06/2022   CHOLHDL 2.4 08/06/2022   Last hemoglobin A1c Lab Results  Component Value Date   HGBA1C 5.2 08/06/2022   Last thyroid functions Lab Results  Component Value Date   TSH 3.190 02/23/2019   Last vitamin D No results found for: "25OHVITD2", "25OHVITD3", "VD25OH"      Objective    BP (!) 126/90 (Cuff Size: Normal)   Pulse 90   Temp 99 F (37.2 C) (Oral)   Resp 16   Ht 5\' 1"  (1.549 m)    Wt 132 lb (59.9 kg)   LMP 10/25/2019 (Approximate)   SpO2 100%   BMI 24.94 kg/m  BP Readings from Last 3 Encounters:  01/28/24 (!) 126/90  08/26/23 (!) 117/90  06/24/23 118/82   Wt Readings from Last 3 Encounters:  01/28/24 132 lb (59.9 kg)  08/26/23 138 lb 4.8 oz (62.7 kg)  06/24/23 140 lb 3.2 oz (63.6 kg)        Physical Exam  General: Alert, no acute distress Cardio: Normal S1 and S2, RRR, no r/m/g Pulm: CTAB, normal work of breathing   No results found for any visits on 01/28/24.  Assessment & Plan     Problem List  Items Addressed This Visit       Cardiovascular and Mediastinum   Migraine headache without aura - Primary   Chronic migraines, previously managed with nortriptyline 30 mg nightly. She has not been taking the medication due to lack of prescription refills. Initial improvement noted with nortriptyline. Discussed titration schedule to minimize side effects and ensure tolerance. Chronic, stable  - Restart nortriptyline with titration: 10 mg once nightly for 3 days, then 20 mg nightly for 3 days, then 30 mg nightly continuously - Send prescription for nortriptyline to the pharmacy      Relevant Medications   nortriptyline (PAMELOR) 10 MG capsule   traZODone (DESYREL) 50 MG tablet     Respiratory   Moderate persistent asthma   Moderate persistent asthma. Currently using albuterol 108 mcg as needed. Previously benefited from Trelegy but has not pursued a prescription. Discussed the benefits of Trelegy and the need for consistent use. Referred to lung specialist for further evaluation. Chronic,stable  - Prescribe Trelegy 200/62.5/25 mcg - Continue albuterol 108 mcg as needed - Refer to lung specialist for further evaluation      Relevant Medications   Fluticasone-Umeclidin-Vilant (TRELEGY ELLIPTA) 200-62.5-25 MCG/ACT AEPB     Digestive   Gastroesophageal reflux disease with esophagitis   Chronic, stable  GERD managed with Protonix 40 mg daily. No  current issues reported. - Continue Protonix 40 mg daily        Other   Seizures (HCC)   Chronic,stable, no recent seizures Seizures well-controlled on Depakote 1000 mg daily at bedtime. No recent seizures reported. - Continue Depakote 1000 mg daily at bedtime      Other Visit Diagnoses       Primary insomnia       Relevant Medications   traZODone (DESYREL) 50 MG tablet           Hypertension Elevated blood pressure noted at 117/98 mmHg and 126/90 mmHg. No prior diagnosis of hypertension. Possible contributing factors include dietary salt, caffeine, and other lifestyle factors. Discussed monitoring blood pressure at home and lifestyle modifications. Chronic,stable - Monitor blood pressure at home - Schedule follow-up in 4-6 weeks to reassess blood pressure and conduct annual physical with labs  Insomnia Chronic insomnia managed with trazodone 150 mg as needed. She confirms current dosage. - Continue trazodone 150 mg as needed for insomnia  General Health Maintenance Declined colon cancer screening options (colonoscopy and Cologuard). Discussed the importance of screening and potential outcomes of undiagnosed colon cancer. - Document patient's refusal of colon cancer screening     Return in about 5 weeks (around 03/03/2024) for CPE, HTN.         Ronnald Ramp, MD  [facility] [phone] (phone) [phone] (fax)  [facility]

## 2024-01-28 NOTE — Patient Instructions (Signed)
VISIT SUMMARY:  Today, we reviewed your ongoing health conditions, including migraines, asthma, seizures, and other concerns. We discussed your current medications, made some adjustments, and planned follow-up steps to ensure your health remains stable.  YOUR PLAN:  -MIGRAINE: Migraines are severe headaches often accompanied by nausea and sensitivity to light and sound.  We will restart your nortriptyline medication with a gradual increase in dosage to help manage your migraines.  Please take 10 mg once nightly for 3 days, then 20 mg nightly for 3 days, and finally 30 mg nightly continuously.  A prescription has been sent to your pharmacy.  -ASTHMA: Asthma is a condition where your airways narrow and swell, making it difficult to breathe. We will prescribe Trelegy 200/62.5/25 mcg to help manage your symptoms. Continue using albuterol 108 mcg as needed. You will also be referred to a lung specialist for further evaluation.  -HYPERTENSION: Hypertension, or high blood pressure, can lead to serious health issues if not managed. Your blood pressure readings were elevated today. We recommend monitoring your blood pressure at home and making lifestyle changes such as reducing salt and caffeine intake. We will reassess your blood pressure in 4-6 weeks during your annual physical.  -SEIZURE DISORDER: Seizure disorder involves episodes of uncontrolled electrical activity in the brain. Your seizures are well-controlled with Depakote 1000 mg daily at bedtime. Continue taking this medication as prescribed.  -GASTROESOPHAGEAL REFLUX DISEASE (GERD): GERD is a condition where stomach acid frequently flows back into the tube connecting your mouth and stomach. Your GERD is well-managed with Protonix 40 mg daily. Continue taking this medication as prescribed.  -INSOMNIA: Insomnia is difficulty falling or staying asleep. Your insomnia is managed with trazodone 150 mg as needed. Continue taking this medication as  needed.  -GENERAL HEALTH MAINTENANCE: We discussed the importance of colon cancer screening, but you have declined the options available. It is important to consider these screenings in the future to detect any potential issues early.  INSTRUCTIONS:  Please schedule a follow-up appointment in 4-6 weeks for a blood pressure check and your annual physical. Continue monitoring your blood pressure at home and make the recommended lifestyle changes.

## 2024-01-28 NOTE — Assessment & Plan Note (Signed)
Moderate persistent asthma. Currently using albuterol 108 mcg as needed. Previously benefited from Trelegy but has not pursued a prescription. Discussed the benefits of Trelegy and the need for consistent use. Referred to lung specialist for further evaluation. Chronic,stable  - Prescribe Trelegy 200/62.5/25 mcg - Continue albuterol 108 mcg as needed - Refer to lung specialist for further evaluation

## 2024-01-29 ENCOUNTER — Other Ambulatory Visit: Payer: Self-pay

## 2024-02-01 ENCOUNTER — Telehealth: Payer: Self-pay | Admitting: Family Medicine

## 2024-02-01 NOTE — Telephone Encounter (Signed)
Covermymeds is requesting prior authorization Key: [id number] Trelegy Ellipta 200- 62.5-25MCG/ACT Aerosol Powder

## 2024-02-01 NOTE — Telephone Encounter (Signed)
Recieved a fax from covermymeds for Trelegy Ellipta 200-62.5-25MCG/ACT Aerosl Powder//patient is waiting   [id number]

## 2024-02-02 ENCOUNTER — Telehealth: Payer: Self-pay

## 2024-02-02 NOTE — Telephone Encounter (Signed)
Pharmacy Patient Advocate Encounter   Received notification from Pt Calls Messages that prior authorization for Trelegy Ellipta 200-62.5-25MCG/ACT aerosol powder is required/requested.   Insurance verification completed.   The patient is insured through [employer] .   Per test claim: PA required; PA submitted to above mentioned insurance via CoverMyMeds Key/confirmation #/EOC [id number] Status is pending

## 2024-02-12 NOTE — Telephone Encounter (Signed)
 Questions expired, renewed request, new key # [id number]

## 2024-02-17 ENCOUNTER — Other Ambulatory Visit (HOSPITAL_COMMUNITY): Payer: Self-pay

## 2024-02-17 NOTE — Telephone Encounter (Signed)
 Pharmacy Patient Advocate Encounter  Received notification from [facility] that Prior Authorization for Trelegy Ellipta 200-62.5-25MCG/ACT aerosol powder has been APPROVED from 02/12/24 to 02/11/24   PA #/Case ID/Reference #: [id number]

## 2024-03-01 ENCOUNTER — Other Ambulatory Visit: Payer: Self-pay

## 2024-03-01 ENCOUNTER — Other Ambulatory Visit: Payer: Self-pay | Admitting: Family Medicine

## 2024-03-01 DIAGNOSIS — J4540 Moderate persistent asthma, uncomplicated: Secondary | ICD-10-CM

## 2024-03-01 DIAGNOSIS — K2100 Gastro-esophageal reflux disease with esophagitis, without bleeding: Secondary | ICD-10-CM

## 2024-03-01 MED ORDER — TRAZODONE HCL 100 MG PO TABS
100.0000 mg | ORAL_TABLET | Freq: Every evening | ORAL | 0 refills | Status: DC | PRN
  Filled 2024-03-01: qty 60, 30d supply, fill #0

## 2024-03-01 MED ORDER — DIVALPROEX SODIUM ER 500 MG PO TB24
1000.0000 mg | ORAL_TABLET | Freq: Every evening | ORAL | 0 refills | Status: DC
Start: 2024-03-01 — End: 2024-08-10
  Filled 2024-03-01: qty 60, 30d supply, fill #0

## 2024-03-01 NOTE — Telephone Encounter (Signed)
 Copied from CRM [id number]. Topic: Clinical - Medication Refill >> Mar 01, 2024  1:15 PM Carlatta H wrote: Most Recent Primary Care Visit:  Provider: Ronnald Ramp  Department: [facility]  Visit Type: OFFICE VISIT  Date: 01/28/2024  Medication: pantoprazole (PROTONIX) 40 MG tablet [[id number]] Fluticasone-Umeclidin-Vilant (TRELEGY ELLIPTA) 200-62.5-25 MCG/ACT AEPB [[id number]]   Has the patient contacted their pharmacy? No (Agent: If no, request that the patient contact the pharmacy for the refill. If patient does not wish to contact the pharmacy document the reason why and proceed with request.) (Agent: If yes, when and what did the pharmacy advise?)  Is this the correct pharmacy for this prescription? Yes If no, delete pharmacy and type the correct one.  This is the patient's preferred pharmacy:  [facility] [address] [place] [address] Phone: [phone] Fax: [phone]   Has the prescription been filled recently? No  Is the patient out of the medication? Yes  Has the patient been seen for an appointment in the last year OR does the patient have an upcoming appointment? Yes  Can we respond through MyChart? Yes  Agent: Please be advised that Rx refills may take up to 3 business days. We ask that you follow-up with your pharmacy.

## 2024-03-02 NOTE — Telephone Encounter (Signed)
 Rxs have been sent to pharmacy   Requested Prescriptions  Pending Prescriptions Disp Refills   pantoprazole (PROTONIX) 40 MG tablet 90 tablet 1    Sig: Take 1 tablet (40 mg total) by mouth daily.     Gastroenterology: Proton Pump Inhibitors Passed - 03/02/2024  5:11 PM      Passed - Valid encounter within last 12 months    Recent Outpatient Visits           6 months ago Moderate persistent asthma without complication   [place] [facility] Simmons-Robinson, [place], MD   8 months ago Moderate persistent asthma with exacerbation   [place] [facility] [place], [place], MD   9 months ago Moderate persistent asthma with exacerbation   [facility] Jacky Kindle, FNP   11 months ago Wheezing   [place] [facility] Simmons-Robinson, Tawanna Cooler, MD   1 year ago Episodic cluster headache, not intractable   [place] [facility] Ronnald Ramp, MD       Future Appointments             In 1 month Simmons-Robinson, Tawanna Cooler, MD [facility]             Fluticasone-Umeclidin-Vilant (TRELEGY ELLIPTA) 200-62.5-25 MCG/ACT AEPB 60 each 3    Sig: Inhale 1 Inhalation into the lungs daily.     Off-Protocol Failed - 03/02/2024  5:11 PM      Failed - Medication not assigned to a protocol, review manually.      Passed - Valid encounter within last 12 months    Recent Outpatient Visits           6 months ago Moderate persistent asthma without complication   [place] [facility] Simmons-Robinson, [place], MD   8 months ago Moderate persistent asthma with exacerbation   [place] [facility] [place], [place], MD   9 months ago Moderate persistent asthma with exacerbation   [facility] Jacky Kindle, FNP   11 months ago Wheezing   [place] [facility]  Simmons-Robinson, Tawanna Cooler, MD   1 year ago Episodic cluster headache, not intractable   [place] [facility] Simmons-Robinson, Tawanna Cooler, MD       Future Appointments             In 1 month Simmons-Robinson, Tawanna Cooler, MD [facility], [place]

## 2024-03-03 ENCOUNTER — Encounter: Payer: MEDICAID | Admitting: Family Medicine

## 2024-03-08 ENCOUNTER — Other Ambulatory Visit: Payer: Self-pay | Admitting: Family Medicine

## 2024-03-08 ENCOUNTER — Other Ambulatory Visit: Payer: Self-pay

## 2024-03-08 DIAGNOSIS — K2100 Gastro-esophageal reflux disease with esophagitis, without bleeding: Secondary | ICD-10-CM

## 2024-03-08 NOTE — Telephone Encounter (Unsigned)
 Copied from CRM [id number]. Topic: Clinical - Prescription Issue >> Mar 08, 2024 10:59 AM Carlatta H wrote: Reason for CRM: Patient needs doctor to call the pharmacy for pantoprazole (PROTONIX) 40 MG tablet [[id number]] for them to go ahead and refill medication//Patient is taking 2 pills a day not 1//Please call patient to advised

## 2024-03-08 NOTE — Telephone Encounter (Signed)
 Called and spoke to the pharmacy, per the pt taking one tablet of pantoprazole  wasn't working so she started taking 2 which is why she is out and needing a refill. Please advise

## 2024-03-09 ENCOUNTER — Other Ambulatory Visit: Payer: Self-pay

## 2024-03-09 MED ORDER — PANTOPRAZOLE SODIUM 40 MG PO TBEC
40.0000 mg | DELAYED_RELEASE_TABLET | Freq: Two times a day (BID) | ORAL | 0 refills | Status: DC
  Filled 2024-03-09: qty 60, 30d supply, fill #0

## 2024-03-15 ENCOUNTER — Other Ambulatory Visit: Payer: Self-pay

## 2024-03-15 MED ORDER — DIVALPROEX SODIUM ER 500 MG PO TB24
1000.0000 mg | ORAL_TABLET | Freq: Every evening | ORAL | 0 refills | Status: AC
  Filled 2024-03-15 – 2024-04-05 (×2): qty 60, 30d supply, fill #0

## 2024-03-15 MED ORDER — TRAZODONE HCL 100 MG PO TABS
100.0000 mg | ORAL_TABLET | Freq: Every evening | ORAL | 0 refills | Status: AC | PRN
Start: 2024-03-15 — End: ?
  Filled 2024-03-15: qty 60, 30d supply, fill #0

## 2024-04-05 ENCOUNTER — Other Ambulatory Visit: Payer: Self-pay

## 2024-04-07 ENCOUNTER — Ambulatory Visit: Payer: MEDICAID | Admitting: Family Medicine

## 2024-04-07 ENCOUNTER — Other Ambulatory Visit: Payer: Self-pay

## 2024-04-07 ENCOUNTER — Encounter: Payer: Self-pay | Admitting: Family Medicine

## 2024-04-07 VITALS — BP 128/89 | HR 79 | Ht 61.0 in | Wt 131.0 lb

## 2024-04-07 DIAGNOSIS — R569 Unspecified convulsions: Secondary | ICD-10-CM

## 2024-04-07 DIAGNOSIS — Z0000 Encounter for general adult medical examination without abnormal findings: Secondary | ICD-10-CM | POA: Insufficient documentation

## 2024-04-07 DIAGNOSIS — K2100 Gastro-esophageal reflux disease with esophagitis, without bleeding: Secondary | ICD-10-CM

## 2024-04-07 DIAGNOSIS — Z131 Encounter for screening for diabetes mellitus: Secondary | ICD-10-CM

## 2024-04-07 DIAGNOSIS — J4540 Moderate persistent asthma, uncomplicated: Secondary | ICD-10-CM | POA: Diagnosis not present

## 2024-04-07 DIAGNOSIS — Z13220 Encounter for screening for lipoid disorders: Secondary | ICD-10-CM

## 2024-04-07 DIAGNOSIS — Z114 Encounter for screening for human immunodeficiency virus [HIV]: Secondary | ICD-10-CM

## 2024-04-07 DIAGNOSIS — Z130 Encounter for screening for diseases of the blood and blood-forming organs and certain disorders involving the immune mechanism: Secondary | ICD-10-CM

## 2024-04-07 MED ORDER — PANTOPRAZOLE SODIUM 40 MG PO TBEC
40.0000 mg | DELAYED_RELEASE_TABLET | Freq: Two times a day (BID) | ORAL | 6 refills | Status: AC
  Filled 2024-04-07 – 2024-05-26 (×2): qty 60, 30d supply, fill #0
  Filled 2024-08-10: qty 60, 30d supply, fill #1
  Filled 2024-10-18: qty 60, 30d supply, fill #2
  Filled 2024-12-19: qty 60, 30d supply, fill #3

## 2024-04-07 NOTE — Progress Notes (Unsigned)
 Complete physical exam   Patient: Kara Lawrence   DOB: 1976-12-14   48 y.o. Female  MRN: [id number] Visit Date: 04/07/2024  Today's healthcare provider: Mimi Alt, MD   Chief Complaint  Patient presents with   Annual Exam    No concerns    Subjective    Kara Lawrence is a 48 y.o. female who presents today for a complete physical exam.   She reports consuming a {diet types:17450} diet.   {Exercise:19826}    She {does/does not:200015} have additional problems to discuss today.   Discussed the use of AI scribe software for clinical note transcription with the patient, who gave verbal consent to proceed.  History of Present Illness Kara Lawrence is a 48 year old female who presents for an annual physical exam.  She has a history of seizures, but no specific details about recent episodes or current management were discussed during the visit.  She has gastric reflux and is currently taking Protonix  40 mg twice a day. She recently received a supply of this medication and is concerned about when she will need a refill.  She has moderate persistent asthma with no recent exacerbations or specific symptoms discussed. Her work in [employer] involves a lot of physical activity such as lifting and walking, contributing to her overall physical activity level.  She experiences migraines, but no specific details about frequency, triggers, or current management were discussed.  She follows a regular diet and does not adhere to any specific dietary restrictions. Her work involves physical activity, which she considers part of her exercise routine.   Past Medical History:  Diagnosis Date   Abnormal uterine bleeding 07/31/2019   Formatting of this note might be different from the original.  - Reports since menarche (age unknown) menses have been regular in timing, x 6-7 days, however heavy in amount. On heaviest days saturates 1 pad/hour.   - Menses also consistently associated with dysmenorrhea, controlled with Tylenol  however sometimes so severe she is unable to perform daily activities.  - Heavy menstrual bleeding endor   Allergy    Anxiety    Bipolar 1 disorder (HCC)    Depression    Glaucoma    Hepatic injury 10/24/2015   Manic depression (HCC)    Dr Ardelia Beau, psychiatrist   Seizure [facility])    Past Surgical History:  Procedure Laterality Date   arm surgery     FACIAL FRACTURE SURGERY     TUBAL LIGATION Bilateral    VAGINAL HYSTERECTOMY  2022   Social History   Socioeconomic History   Marital status: Single    Spouse name: Not on file   Number of children: 5   Years of education: 8   Highest education level: 8th grade  Occupational History   Occupation: Lobbyist: [employer]  Tobacco Use   Smoking status: Never   Smokeless tobacco: Never   Tobacco comments:    Patient refuses to give any [facility] or answer SDOH question.  Vaping Use   Vaping status: Never Used  Substance and Sexual Activity   Alcohol use: Not Currently   Drug use: Not Currently    Types: "Crack" cocaine, Benzodiazepines, Cocaine, Marijuana   Sexual activity: Not Currently  Other Topics Concern   Not on file  Social History Narrative   Lives in boarding room with boyfriend   Social Drivers of Health   Financial Resource Strain: Medium Risk (06/25/2023)   Overall Financial Resource Strain (  CARDIA)    Difficulty of Paying Living Expenses: Somewhat hard  Food Insecurity: Unknown (01/09/2022)   Hunger Vital Sign    Worried About Running Out of Food in the Last Year: Patient declined    Ran Out of Food in the Last Year: Patient declined  Transportation Needs: Unknown (01/09/2022)   PRAPARE - Administrator, Civil Service (Medical): Patient declined    Lack of Transportation (Non-Medical): Patient declined  Physical Activity: Inactive (01/12/2020)   Received from [facility], [facility]   Exercise Vital Sign     Days of Exercise per Week: 0 days    Minutes of Exercise per Session: 0 min  Stress: Stress Concern Present (01/12/2020)   Received from [facility], [facility] of Occupational Health - Occupational Stress Questionnaire    Feeling of Stress : To some extent  Social Connections: Socially Isolated (10/07/2022)   Social Connection and Isolation Panel [NHANES]    Frequency of Communication with Friends and Family: More than three times a week    Frequency of Social Gatherings with Friends and Family: Once a week    Attends Religious Services: Never    Database administrator or Organizations: No    Attends Banker Meetings: Never    Marital Status: Never married  Intimate Partner Violence: Not At Risk (10/07/2022)   Humiliation, Afraid, Rape, and Kick questionnaire    Fear of Current or Ex-Partner: No    Emotionally Abused: No    Physically Abused: No    Sexually Abused: No   Family Status  Relation Name Status   Mother  Alive   Mat Aunt  (Not Specified)  No partnership data on file   Family History  Problem Relation Age of Onset   Breast cancer Maternal Aunt 67   No Known Allergies   Medications: Outpatient Medications Prior to Visit  Medication Sig   albuterol  (VENTOLIN  HFA) 108 (90 Base) MCG/ACT inhaler Inhale 2 to 4 puffs into the lungs once every 4 hours as needed for wheezing or cough.   divalproex  (DEPAKOTE  ER) 500 MG 24 hr tablet Take 2 tablets (1,000 mg total) by mouth at bedtime.   divalproex  (DEPAKOTE  ER) 500 MG 24 hr tablet Take 2 tablets (1,000 mg total) by mouth at bedtime.   Fluticasone -Umeclidin-Vilant (TRELEGY ELLIPTA ) 200-62.5-25 MCG/ACT AEPB Inhale 1 Inhalation into the lungs daily.   nortriptyline  (PAMELOR ) 10 MG capsule Take 1 capsule (10 mg) by mouth once daily at night for 3 days. Then take 2 capsules (20 mg) at night for 3 days. Then take 3 capsules (30 mg) nightly for headaches. (May take an extra 1 caspule (10 mg)  as needed for rescue).   pantoprazole  (PROTONIX ) 40 MG tablet Take 1 tablet (40 mg total) by mouth 2 (two) times daily.   traZODone  (DESYREL ) 100 MG tablet Take 1-2 tablets (100-200 mg total) by mouth at bedtime as needed for sleep.   traZODone  (DESYREL ) 100 MG tablet Take 1-2 tablets (100-200 mg total) by mouth at bedtime as needed.   traZODone  (DESYREL ) 50 MG tablet Take 1.5-3 tablets (75-150 mg total) by mouth at bedtime as needed.   [DISCONTINUED] albuterol  (VENTOLIN  HFA) 108 (90 Base) MCG/ACT inhaler Inhale 2-4 puffs into the lungs every 4 (four) hours as needed for wheezing (or cough).   No facility-administered medications prior to visit.    Review of Systems  Last CBC Lab Results  Component Value Date  WBC 5.1 08/06/2022   HGB 12.7 08/06/2022   HCT 37.1 08/06/2022   MCV 75 (L) 08/06/2022   MCH 25.7 (L) 08/06/2022   RDW 13.7 08/06/2022   PLT 294 08/06/2022   Last metabolic panel Lab Results  Component Value Date   GLUCOSE 80 08/06/2022   NA 138 08/06/2022   K 4.3 08/06/2022   CL 100 08/06/2022   CO2 22 08/06/2022   BUN 12 08/06/2022   CREATININE 0.71 08/06/2022   EGFR 105 08/06/2022   CALCIUM 9.4 08/06/2022   PROT 7.6 08/06/2022   ALBUMIN 4.3 08/06/2022   LABGLOB 3.3 08/06/2022   AGRATIO 1.3 08/06/2022   BILITOT 0.3 08/06/2022   ALKPHOS 89 08/06/2022   AST 35 08/06/2022   ALT 32 08/06/2022   ANIONGAP 23 (H) 07/19/2019   Last lipids Lab Results  Component Value Date   CHOL 121 08/06/2022   HDL 50 08/06/2022   LDLCALC 60 08/06/2022   TRIG 46 08/06/2022   CHOLHDL 2.4 08/06/2022   Last hemoglobin A1c Lab Results  Component Value Date   HGBA1C 5.2 08/06/2022   Last thyroid functions Lab Results  Component Value Date   TSH 3.190 02/23/2019   Last vitamin D No results found for: "25OHVITD2", "25OHVITD3", "VD25OH" Last vitamin B12 and Folate No results found for: "VITAMINB12", "FOLATE"   {See past labs  Heme  Chem  Endocrine  Serology  Results  Review (optional):1}  Objective    BP 128/89   Pulse 79   Ht 5\' 1"  (1.549 m)   Wt 131 lb (59.4 kg)   LMP 10/25/2019 (Approximate)   SpO2 98%   BMI 24.75 kg/m  BP Readings from Last 3 Encounters:  04/07/24 128/89  01/28/24 (!) 126/90  08/26/23 (!) 117/90   Wt Readings from Last 3 Encounters:  04/07/24 131 lb (59.4 kg)  01/28/24 132 lb (59.9 kg)  08/26/23 138 lb 4.8 oz (62.7 kg)    {See vitals history (optional):1}    Physical Exam Vitals reviewed.  Constitutional:      General: She is not in acute distress.    Appearance: Normal appearance. She is not ill-appearing, toxic-appearing or diaphoretic.  HENT:     Head: Normocephalic and atraumatic.     Right Ear: Tympanic membrane and external ear normal. There is no impacted cerumen.     Left Ear: Tympanic membrane and external ear normal. There is no impacted cerumen.     Nose: Nose normal.     Mouth/Throat:     Pharynx: Oropharynx is clear.  Eyes:     General: No scleral icterus.    Extraocular Movements: Extraocular movements intact.     Conjunctiva/sclera: Conjunctivae normal.     Pupils: Pupils are equal, round, and reactive to light.  Cardiovascular:     Rate and Rhythm: Normal rate and regular rhythm.     Pulses: Normal pulses.     Heart sounds: Normal heart sounds. No murmur heard.    No friction rub. No gallop.  Pulmonary:     Effort: Pulmonary effort is normal. No respiratory distress.     Breath sounds: Normal breath sounds. No wheezing, rhonchi or rales.  Abdominal:     General: Bowel sounds are normal. There is no distension.     Palpations: Abdomen is soft. There is no mass.     Tenderness: There is no abdominal tenderness. There is no guarding.  Musculoskeletal:        General: No deformity.     Cervical  back: Normal range of motion and neck supple.     Right lower leg: No edema.     Left lower leg: No edema.  Lymphadenopathy:     Cervical: No cervical adenopathy.  Skin:    General: Skin is  warm.     Capillary Refill: Capillary refill takes less than 2 seconds.     Findings: No erythema or rash.  Neurological:     General: No focal deficit present.     Mental Status: She is alert and oriented to person, place, and time.     Cranial Nerves: Cranial nerves 2-12 are intact. No cranial nerve deficit or facial asymmetry.     Motor: Motor function is intact. No weakness.     Gait: Gait normal.  Psychiatric:        Mood and Affect: Mood normal.        Behavior: Behavior normal.     ***  Last depression screening scores    04/07/2024    8:51 AM 01/28/2024    9:11 AM 08/26/2023    9:15 AM  PHQ 2/9 Scores  PHQ - 2 Score  4 4  PHQ- 9 Score  11 6  Exception Documentation Patient refusal Patient refusal     Last fall risk screening    01/28/2024    9:10 AM  Fall Risk   Falls in the past year? 0  Number falls in past yr: 0  Injury with Fall? 0  Risk for fall due to : No Fall Risks    Last Audit-C alcohol use screening    01/09/2022   10:25 AM  Alcohol Use Disorder Test (AUDIT)  Patient refused Alcohol Screening Tool Yes   A score of 3 or more in women, and 4 or more in men indicates increased risk for alcohol abuse, EXCEPT if all of the points are from question 1   No results found for any visits on 04/07/24.  Assessment & Plan    Routine Health Maintenance and Physical Exam  Immunization History  Administered Date(s) Administered   PFIZER(Purple Top)SARS-COV-2 Vaccination 08/13/2020   Tdap 07/19/2019    Health Maintenance  Topic Date Due   HIV Screening  Never done   Pneumococcal Vaccine 74-21 Years old (1 of 2 - PCV) Never done   COVID-19 Vaccine (2 - 2024-25 season) 08/16/2023   Colonoscopy  01/27/2025 (Originally 06/27/2019)   INFLUENZA VACCINE  07/15/2024   Cervical Cancer Screening (HPV/Pap Cotest)  07/31/2024   DTaP/Tdap/Td (2 - Td or Tdap) 07/18/2029   Hepatitis C Screening  Completed   HPV VACCINES  Aged Out   Meningococcal B Vaccine  Aged Out     Problem List Items Addressed This Visit       Respiratory   Moderate persistent asthma     Digestive   Gastroesophageal reflux disease with esophagitis     Other   Seizures (HCC)   Annual physical exam - Primary   Other Visit Diagnoses       Screening for deficiency anemia         Screening for diabetes mellitus         Screening for lipid disorders         Screening for HIV (human immunodeficiency virus)           Assessment & Plan Wellness Visit Annual physical examination conducted. Emphasized regular exercise and a well-balanced diet. Recommended screenings for diabetes, anemia, renal and hepatic function, and dyslipidemia. She prefers to return for  labs. - Order lipid panel, CBC, A1c, BMP, and HIV screening - Recommend 150 minutes of exercise per week as tolerated - Advise well-balanced diet - Schedule follow-up appointment for lab work - Inform her of lab results via MyChart  Moderate persistent asthma Moderate persistent asthma. Recommended pneumococcal vaccination due to asthma. Discussed importance of COVID vaccination update. - Recommend pneumococcal vaccination - Advise COVID vaccination update  Gastroesophageal reflux disease (GERD) Currently taking Protonix  40 mg twice daily. Prescription last sent in March. May need refill soon. - Ensure Protonix  prescription is up to date and refill if necessary       No follow-ups on file.       Mimi Alt, MD  [facility] [phone] (phone) [phone] (fax)  [facility]

## 2024-04-07 NOTE — Patient Instructions (Signed)
 Recommended Vaccines  -Pneumococcal Vaccine

## 2024-04-08 NOTE — Assessment & Plan Note (Signed)
 Annual physical examination conducted. Emphasized regular exercise and a well-balanced diet. Recommended screenings for diabetes, anemia, renal and hepatic function, and dyslipidemia. She prefers to return for labs. - Order lipid panel, CBC, A1c, BMP, and HIV screening - Recommend 150 minutes of exercise per week as tolerated - Advise well-balanced diet - recommended pneumococcal vaccine, pt declined  today - Schedule follow-up appointment for lab work - Inform her of lab results via MyChart

## 2024-04-18 ENCOUNTER — Other Ambulatory Visit: Payer: Self-pay

## 2024-05-26 ENCOUNTER — Other Ambulatory Visit: Payer: Self-pay

## 2024-05-27 ENCOUNTER — Other Ambulatory Visit: Payer: Self-pay

## 2024-08-09 ENCOUNTER — Encounter: Payer: MEDICAID | Admitting: Family Medicine

## 2024-08-10 ENCOUNTER — Encounter: Payer: Self-pay | Admitting: Family Medicine

## 2024-08-10 ENCOUNTER — Ambulatory Visit: Payer: MEDICAID | Admitting: Family Medicine

## 2024-08-10 ENCOUNTER — Other Ambulatory Visit: Payer: Self-pay

## 2024-08-10 VITALS — BP 124/89 | HR 96 | Wt 119.0 lb

## 2024-08-10 DIAGNOSIS — F3132 Bipolar disorder, current episode depressed, moderate: Secondary | ICD-10-CM

## 2024-08-10 DIAGNOSIS — Z1231 Encounter for screening mammogram for malignant neoplasm of breast: Secondary | ICD-10-CM

## 2024-08-10 DIAGNOSIS — R569 Unspecified convulsions: Secondary | ICD-10-CM | POA: Diagnosis not present

## 2024-08-10 DIAGNOSIS — J4540 Moderate persistent asthma, uncomplicated: Secondary | ICD-10-CM

## 2024-08-10 NOTE — Patient Instructions (Signed)
 [facility] [address] [place],  [place]  [address] Main: [phone]

## 2024-08-10 NOTE — Progress Notes (Signed)
 Established patient visit   Patient: Kara Lawrence   DOB: 08/25/1976   48 y.o. Female  MRN: [id number] Visit Date: 08/10/2024  Today's healthcare provider: Rockie Agent, MD   Chief Complaint  Patient presents with   Medical Management of Chronic Issues   Subjective       Discussed the use of AI scribe software for clinical note transcription with the patient, who gave verbal consent to proceed.  History of Present Illness Kara Lawrence is a 48 year old female with moderate persistent asthma who presents for follow-up and lab evaluation.  She has moderate persistent asthma and is taking Trelegy 200-62.5-25 micrograms once daily. The patient reports she has not had any recent asthma attacks.  She has a history of seizures and is taking Depakote  500 mg daily. There is a prescription for Depakote  1000 mg at bedtime, but it is unclear if she is taking this dose. She needs to re-establish care with [facility] after being removed from the system.  She experiences insomnia and is taking trazodone  100 mg daily for management.  She reports ongoing headaches. However, she does not have any amitriptyline currently.  She had a physical in April, and labs from 2023 showed a low MCV, though her hemoglobin was 12.7, indicating she was not anemic. Her A1c was normal, and her metabolic panel, including kidney and liver function, was good at that time.     Past Medical History:  Diagnosis Date   Abnormal uterine bleeding 07/31/2019   Formatting of this note might be different from the original.  - Reports since menarche (age unknown) menses have been regular in timing, x 6-7 days, however heavy in amount. On heaviest days saturates 1 pad/hour.  - Menses also consistently associated with dysmenorrhea, controlled with Tylenol  however sometimes so severe she is unable to perform daily activities.  - Heavy menstrual bleeding endor   Allergy    Anxiety    Bipolar 1  disorder (HCC)    Depression    Glaucoma    Hepatic injury 10/24/2015   Manic depression (HCC)    Dr Kym, psychiatrist   Seizure [facility]     Medications: Outpatient Medications Prior to Visit  Medication Sig   albuterol  (VENTOLIN  HFA) 108 (90 Base) MCG/ACT inhaler Inhale 2 to 4 puffs into the lungs once every 4 hours as needed for wheezing or cough.   divalproex  (DEPAKOTE  ER) 500 MG 24 hr tablet Take 2 tablets (1,000 mg total) by mouth at bedtime.   Fluticasone -Umeclidin-Vilant (TRELEGY ELLIPTA ) 200-62.5-25 MCG/ACT AEPB Inhale 1 Inhalation into the lungs daily.   nortriptyline  (PAMELOR ) 10 MG capsule Take 1 capsule (10 mg) by mouth once daily at night for 3 days. Then take 2 capsules (20 mg) at night for 3 days. Then take 3 capsules (30 mg) nightly for headaches. (May take an extra 1 caspule (10 mg) as needed for rescue).   pantoprazole  (PROTONIX ) 40 MG tablet Take 1 tablet (40 mg total) by mouth 2 (two) times daily.   [DISCONTINUED] divalproex  (DEPAKOTE  ER) 500 MG 24 hr tablet Take 2 tablets (1,000 mg total) by mouth at bedtime.   [DISCONTINUED] traZODone  (DESYREL ) 100 MG tablet Take 1-2 tablets (100-200 mg total) by mouth at bedtime as needed for sleep.   [DISCONTINUED] traZODone  (DESYREL ) 50 MG tablet Take 1.5-3 tablets (75-150 mg total) by mouth at bedtime as needed.   traZODone  (DESYREL ) 100 MG tablet Take 1-2 tablets (100-200 mg total) by mouth at bedtime as  needed.   No facility-administered medications prior to visit.    Review of Systems      Objective    BP 124/89 (BP Location: Left Arm, Patient Position: Sitting, Cuff Size: Normal)   Pulse 96   Wt 119 lb (54 kg)   LMP 10/25/2019 (Approximate)   SpO2 98%   BMI 22.48 kg/m      Physical Exam  Physical Exam CHEST: Clear to auscultation bilaterally, no wheezes, rhonchi, or crackles.    No results found for any visits on 08/10/24.  Assessment & Plan     Problem List Items Addressed This Visit     Moderate  persistent asthma - Primary   Seizures (HCC)   Other Visit Diagnoses       Bipolar affective disorder, currently depressed, moderate (HCC)         Encounter for screening mammogram for malignant neoplasm of breast       Relevant Orders   MM 3D SCREENING MAMMOGRAM BILATERAL BREAST        Assessment & Plan Moderate persistent asthma Chronic Moderate persistent asthma is well-controlled with current medication regimen. No recent asthma attacks reported. - Continue Trelegy 200-62.5-25 mcg once daily.  Epilepsy (seizure disorder) Chronic epilepsy managed with Depakote . - Continue Depakote  1000 mg at bedtime.  Insomnia Chronic insomnia managed with trazodone . - Continue trazodone  100-200 mg at bedtime.  Headache Chronic headaches persist. No current medication available for headache management. -will reassess during follow up, patient is not taking TCA for prevention measures  General Health Maintenance She no longer requires Pap smears due to hysterectomy. HM tab updated  - Order mammogram for breast cancer screening.     Return in about 4 months (around 12/10/2024) for Chronic F/U,Asthma .         Rockie Agent, MD  [facility] [phone] (phone) [phone] (fax)  [facility]

## 2024-09-21 ENCOUNTER — Ambulatory Visit: Payer: Self-pay

## 2024-09-21 NOTE — Telephone Encounter (Signed)
 FYI Only or Action Required?: Action required by provider: request for appointment and incomplete triage due to angry, demanding, refusing to answer question.  Patient was last seen in primary care on 08/10/2024 by Sharma Coyer, MD.  Called Nurse Triage reporting Epistaxis.  Symptoms began yesterday.  Interventions attempted: Other: paramedic evaluation.  Symptoms are: completely resolved.  Triage Disposition: Call PCP Now  Patient/caregiver understands and will follow disposition?: No, refuses disposition   Copied from CRM [id number]. Topic: Clinical - Red Word Triage >> Sep 21, 2024 11:25 AM Zebedee SAUNDERS wrote: Red Word that prompted transfer to Nurse Triage: Pt has unexplained nose bleeds. Reason for Disposition  [1] Angry or rude caller AND [2] doesn't respond to 5 minutes of triager counseling AND [3] sick adult (or caller)  Answer Assessment - Initial Assessment Questions Additional info: Incomplete triage:  Difficult triage, patient refusing to answer questions, demanding appointment with pcp on Monday or Tuesday morning only Refused all offered appointments, demanding appointment on Monday or Tuesday morning only. Offered to send message to see if patient can be worked in but she stated no, I don't want that, you do not do your jobs, give me an appointment on Monday or Tuesday This writer again offered available appointments and she states I will be there Monday and then hung up on this Clinical research associate.     1. AMOUNT OF BLEEDING: How bad is the bleeding? How much blood was lost? Has the bleeding stopped?     clots 2. ONSET: When did the nosebleed start?      yesterday 3. FREQUENCY: How many nosebleeds have you had in the last 24 hours?      two 4. RECURRENT SYMPTOMS: Have there been other recent nosebleeds? If Yes, ask: How long did it take you to stop the bleeding? What worked best?      Two in last 24 hours-evaluated by paramedic  5. CAUSE: What do  you think caused this nosebleed?     unknown 6. LOCAL FACTORS: Do you have any cold symptoms?, Have you been rubbing or picking at your nose?     Difficult triage, patient refusing to answer questions, demanding appointment with pcp on Monday or Tuesday morning only 7. SYSTEMIC FACTORS: Do you have high blood pressure or any bleeding problems?     refused 8. BLOOD THINNERS: Do you take any blood thinners? (e.g., aspirin, clopidogrel / Plavix, coumadin, heparin). Notes: Other strong blood thinners include: Arixtra (fondaparinux), Eliquis (apixaban), Pradaxa (dabigatran), and Xarelto (rivaroxaban).     no 9. OTHER SYMPTOMS: Do you have any other symptoms? (e.g., lightheadedness)     Refused this happened yesterday  Answer Assessment - Initial Assessment Questions 1. SITUATION:  Document reason for call.    Schedule appointment on Monday or Tuesday AM only,  for nosebleed.    Incomplete triage:  Difficult triage, patient refusing to answer questions, demanding appointment with pcp on Monday or Tuesday morning only Refused all offered appointments, demanding appointment on Monday or Tuesday morning only. Offered to send message to see if patient can be worked in but she stated no, I don't want that, you do not do your jobs, give me an appointment on Monday or Tuesday This writer again offered available appointments and she states I will be there Monday and then hung up on this Clinical research associate.  Protocols used: Nosebleed-A-AH, Difficult Call-A-AH

## 2024-09-26 ENCOUNTER — Ambulatory Visit: Payer: MEDICAID

## 2024-09-26 NOTE — Progress Notes (Deleted)
      Established patient visit   Patient: Kara Lawrence   DOB: 1976-12-02   48 y.o. Female  MRN: [id number] Visit Date: 09/26/2024  Today's healthcare provider: Isaiah DELENA Pepper, MD   No chief complaint on file.  Subjective    HPI  Discussed the use of AI scribe software for clinical note transcription with the patient, who gave verbal consent to proceed.  History of Present Illness      Medications: Outpatient Medications Prior to Visit  Medication Sig   albuterol  (VENTOLIN  HFA) 108 (90 Base) MCG/ACT inhaler Inhale 2 to 4 puffs into the lungs once every 4 hours as needed for wheezing or cough.   divalproex  (DEPAKOTE  ER) 500 MG 24 hr tablet Take 2 tablets (1,000 mg total) by mouth at bedtime.   Fluticasone -Umeclidin-Vilant (TRELEGY ELLIPTA ) 200-62.5-25 MCG/ACT AEPB Inhale 1 Inhalation into the lungs daily.   nortriptyline  (PAMELOR ) 10 MG capsule Take 1 capsule (10 mg) by mouth once daily at night for 3 days. Then take 2 capsules (20 mg) at night for 3 days. Then take 3 capsules (30 mg) nightly for headaches. (May take an extra 1 caspule (10 mg) as needed for rescue).   pantoprazole  (PROTONIX ) 40 MG tablet Take 1 tablet (40 mg total) by mouth 2 (two) times daily.   traZODone  (DESYREL ) 100 MG tablet Take 1-2 tablets (100-200 mg total) by mouth at bedtime as needed.   No facility-administered medications prior to visit.    Review of Systems as noted in HPI.  {Insert previous labs (optional):23779} {See past labs  Heme  Chem  Endocrine  Serology  Results Review (optional):1}   Objective    LMP 10/25/2019 (Approximate)  {Insert last BP/Wt (optional):23777}{See vitals history (optional):1}  Physical Exam   No results found for any visits on 09/26/24.  Assessment & Plan     Problem List Items Addressed This Visit   None   Assessment and Plan Assessment & Plan       No follow-ups on file.       Isaiah DELENA Pepper, MD  [facility] [phone] (phone) [phone] (fax)

## 2024-10-18 ENCOUNTER — Other Ambulatory Visit: Payer: Self-pay

## 2024-12-12 ENCOUNTER — Ambulatory Visit: Payer: MEDICAID | Admitting: Family Medicine

## 2024-12-12 ENCOUNTER — Ambulatory Visit: Payer: Self-pay | Admitting: Family Medicine

## 2024-12-12 NOTE — Telephone Encounter (Signed)
 Pt did not wish to be triaged, was upset and wanted an appt.  Answer Assessment - Initial Assessment Questions Did not complete triage, pt was upset and wanted the soonest available appt with PCP as she missed her appt today. This RN advised soonest with PCP is in March but that she can be scheduled sooner at [facility]. Pt states she also needs her GERD medication refilled.  Protocols used: [facility] Copied from CRM [id number]. Topic: Clinical - Red Word Triage >> Dec 12, 2024 12:51 PM Hadassah PARAS wrote: Red Word that prompted transfer to Nurse Triage: pt is experiencing bad headaches. Transferring to NT  Previously: Pt is calling to reschedule app for  Chronic F/U,Asthma. Pt was unable to make app today 12/29 due to not feeling well. Next avail app is 03/17. Pt is upset and would like to be seen sooner. During the DT pt was not cooperating and wanting to rush and reschedule. She was upset of all questions being asked in order to schedule. She then stated she needs to be seen sooner due to bad headaches. Transferred to NT

## 2024-12-13 NOTE — Telephone Encounter (Signed)
 Please schedule with next available provider for headaches reported during attempted triage call.

## 2024-12-19 ENCOUNTER — Other Ambulatory Visit: Payer: Self-pay

## 2024-12-19 ENCOUNTER — Ambulatory Visit (INDEPENDENT_AMBULATORY_CARE_PROVIDER_SITE_OTHER): Payer: MEDICAID | Admitting: Family Medicine

## 2024-12-19 ENCOUNTER — Encounter: Payer: Self-pay | Admitting: Family Medicine

## 2024-12-19 VITALS — BP 118/68 | HR 72 | Resp 16 | Ht 61.0 in | Wt 126.0 lb

## 2024-12-19 DIAGNOSIS — G43009 Migraine without aura, not intractable, without status migrainosus: Secondary | ICD-10-CM | POA: Diagnosis not present

## 2024-12-19 MED ORDER — SUMATRIPTAN SUCCINATE 25 MG PO TABS
ORAL_TABLET | ORAL | 0 refills | Status: AC
  Filled 2024-12-19: qty 8, 4d supply, fill #0

## 2024-12-19 NOTE — Assessment & Plan Note (Signed)
 Patient is a 49 year old female seen today for acute migraine, however voices ongoing hx of uncontrolled migraines for several years. Seen by neurology in the past for consult and does not appear to have returned for follow up. Previous discussion with PCP for migraine management included restarting Nortriptyline  and she states that she restarted the 10mg  but did not increase to 30mg  as discussion and treatment plan indicated because she did not find medication to be helpful. She is unsure of medications tried and failed in the past for migraine prevention. She reports Ibuprofen  or Excedrin do not work for her headaches, and she states she has not taken anything today for alleviation of migraine headache.  V/s stable. Patient agitated as per HPI.  No recent labs available for review today, however she declines updating labs.   -Recommended updating labs to rule out other conditions including anemia, electrolyte imbalance contributing to uncontrolled migraines but as noted she declines -Offered Toradol  injection but this is also declined by the patient. She requests a pill.  -Discussed starting Sumatriptan  for migraine relief. She does not recall taking this medication in the past but is receptive.  -Sumatriptan  25mg  PO at onset of headache, and may repeat dose x 1 2 hours after. Max of two tablets in 24 hours. -Advised that she follow up with her PCP within the next one month for migraine follow up. Orders:   SUMAtriptan  (IMITREX ) 25 MG tablet; Take one tablet by mouth at onset of migraine/headache. May repeat dose once (one tablet) in two hours if headache persists. Max of two tablets (50mg ) in 24 hours.

## 2024-12-19 NOTE — Progress Notes (Signed)
 "  Acute Office Visit  Subjective:     Patient ID: Kara Lawrence, female    DOB: May 21, 1976, 49 y.o.   MRN: [id number]  Chief Complaint  Patient presents with   Headache    Chronic. States headache medications does not work for her.    HPI Patient is in today for complaints of uncontrolled migraines. She is a new patient to me and not established in office. She voices she has been struggling with migraine headaches for several years. She voices she has a migraine today at her visit. Reviewing chart available she had previously been seen by neurology for initial consult for migraine management in 09/2023 and I do not see return follow up. She reports she did not follow up with neurology. Last seen by PCP in 07/2024. At prior visit in 01/2024 her PCP had restarted Nortriptyline  at 10mg  with intention to titrate up to 30mg  nightly and continue 30mg  continuously. She voices she stopped Nortriptyline  because she felt that it did not help and admits she never increased to 30mg . She is unsure of what other medications she has tried and failed in the past. Currently takes Depakote  for her seizures and denies recent seizure.  Associated symptoms with migraines she thinks includes nausea and light sensitivity. She does endorse episodes of dizziness with migraine.   Unfortunately visit was shortened as patient became agitated with questions regarding symptoms during the visit.   Review of Systems  Eyes:  Positive for photophobia.  Respiratory:  Negative for shortness of breath.   Cardiovascular:  Negative for chest pain and palpitations.  Gastrointestinal:  Positive for nausea. Negative for vomiting.  Neurological:  Positive for dizziness and headaches. Negative for seizures.        Objective:    BP 118/68   Pulse 72   Resp 16   Ht 5' 1 (1.549 m)   Wt 126 lb (57.2 kg)   LMP 10/25/2019   SpO2 99%   BMI 23.81 kg/m    Physical Exam Constitutional:      Appearance: She is  well-developed.  Cardiovascular:     Rate and Rhythm: Normal rate and regular rhythm.  Pulmonary:     Effort: Pulmonary effort is normal.     Breath sounds: Normal breath sounds.  Neurological:     General: No focal deficit present.     Mental Status: She is alert.         Assessment & Plan:   Assessment & Plan Migraine without aura and without status migrainosus, not intractable Patient is a 49 year old female seen today for acute migraine, however voices ongoing hx of uncontrolled migraines for several years. Seen by neurology in the past for consult and does not appear to have returned for follow up. Previous discussion with PCP for migraine management included restarting Nortriptyline  and she states that she restarted the 10mg  but did not increase to 30mg  as discussion and treatment plan indicated because she did not find medication to be helpful. She is unsure of medications tried and failed in the past for migraine prevention. She reports Ibuprofen  or Excedrin do not work for her headaches, and she states she has not taken anything today for alleviation of migraine headache.  V/s stable. Patient agitated as per HPI.  No recent labs available for review today, however she declines updating labs.   -Recommended updating labs to rule out other conditions including anemia, electrolyte imbalance contributing to uncontrolled migraines but as noted she declines -Offered Toradol   injection but this is also declined by the patient. She requests a pill.  -Discussed starting Sumatriptan  for migraine relief. She does not recall taking this medication in the past but is receptive.  -Sumatriptan  25mg  PO at onset of headache, and may repeat dose x 1 2 hours after. Max of two tablets in 24 hours. -Advised that she follow up with her PCP within the next one month for migraine follow up. Orders:   SUMAtriptan  (IMITREX ) 25 MG tablet; Take one tablet by mouth at onset of migraine/headache. May repeat  dose once (one tablet) in two hours if headache persists. Max of two tablets (50mg ) in 24 hours.     Return in about 1 month (around 01/19/2025) for migraine follow up with PCP, Dr. Sharma .  LAYMON LOISE CORE, FNP   "

## 2025-01-04 ENCOUNTER — Telehealth: Payer: Self-pay | Admitting: Family Medicine

## 2025-01-04 NOTE — Telephone Encounter (Signed)
 I attempted to re-schedule Kara Lawrence for an appointment based on her requested availability. I informed [facility] that Dr. Lang does not have any availability per her demanded request of Mon or Tue ONLY at 10am. I informed Shylee that I will be happy to check with a different providers schedule and she disconnected the call as I was checking availability.

## 2025-01-20 ENCOUNTER — Ambulatory Visit: Payer: MEDICAID | Admitting: Family Medicine

## 2025-01-31 ENCOUNTER — Ambulatory Visit: Payer: MEDICAID
